# Patient Record
Sex: Male | Born: 1965
Health system: Southern US, Community
[De-identification: ages and names within clinical notes are randomized; demographics above are authoritative.]

## PROBLEM LIST (undated history)

## (undated) ENCOUNTER — Emergency Department (HOSPITAL_COMMUNITY): Admission: EM | Payer: Commercial Managed Care - PPO | Source: Home / Self Care

## (undated) DIAGNOSIS — I10 Essential (primary) hypertension: Secondary | ICD-10-CM

## (undated) DIAGNOSIS — I252 Old myocardial infarction: Secondary | ICD-10-CM

## (undated) DIAGNOSIS — R7303 Prediabetes: Secondary | ICD-10-CM

## (undated) DIAGNOSIS — E785 Hyperlipidemia, unspecified: Secondary | ICD-10-CM

## (undated) DIAGNOSIS — I2510 Atherosclerotic heart disease of native coronary artery without angina pectoris: Secondary | ICD-10-CM

## (undated) HISTORY — DX: Essential (primary) hypertension: I10

## (undated) HISTORY — DX: Hyperlipidemia, unspecified: E78.5

## (undated) HISTORY — DX: Old myocardial infarction: I25.2

## (undated) HISTORY — DX: Prediabetes: R73.03

---

## 2007-02-03 ENCOUNTER — Ambulatory Visit: Payer: Self-pay | Admitting: Family Medicine

## 2007-02-03 DIAGNOSIS — J309 Allergic rhinitis, unspecified: Secondary | ICD-10-CM | POA: Insufficient documentation

## 2007-02-04 ENCOUNTER — Ambulatory Visit: Payer: Self-pay | Admitting: Family Medicine

## 2007-02-08 LAB — CONVERTED CEMR LAB
Albumin: 4 g/dL (ref 3.5–5.2)
Alkaline Phosphatase: 74 units/L (ref 39–117)
BUN: 11 mg/dL (ref 6–23)
Basophils Absolute: 0 10*3/uL (ref 0.0–0.1)
Chloride: 106 meq/L (ref 96–112)
Cholesterol: 215 mg/dL (ref 0–200)
Creatinine, Ser: 1 mg/dL (ref 0.4–1.5)
Direct LDL: 150.9 mg/dL
MCHC: 34.3 g/dL (ref 30.0–36.0)
Monocytes Absolute: 0.6 10*3/uL (ref 0.2–0.7)
Monocytes Relative: 8.8 % (ref 3.0–11.0)
Platelets: 200 10*3/uL (ref 150–400)
Potassium: 4.1 meq/L (ref 3.5–5.1)
RBC: 5.66 M/uL (ref 4.22–5.81)
RDW: 11.7 % (ref 11.5–14.6)
Total Bilirubin: 0.8 mg/dL (ref 0.3–1.2)
Total CHOL/HDL Ratio: 7.1
Triglycerides: 148 mg/dL (ref 0–149)

## 2007-08-01 ENCOUNTER — Ambulatory Visit: Payer: Self-pay | Admitting: Family Medicine

## 2007-08-01 DIAGNOSIS — I10 Essential (primary) hypertension: Secondary | ICD-10-CM | POA: Insufficient documentation

## 2007-08-02 LAB — CONVERTED CEMR LAB
CO2: 31 meq/L (ref 19–32)
Creatinine, Ser: 0.9 mg/dL (ref 0.4–1.5)
Glucose, Bld: 94 mg/dL (ref 70–99)
HDL: 25.1 mg/dL — ABNORMAL LOW (ref >39.0)
Potassium: 4 meq/L (ref 3.5–5.1)
Sodium: 140 meq/L (ref 135–145)
Triglycerides: 138 mg/dL (ref 0–149)
VLDL: 28 mg/dL (ref 0–40)

## 2007-09-08 ENCOUNTER — Ambulatory Visit: Payer: Self-pay | Admitting: Family Medicine

## 2007-09-19 ENCOUNTER — Telehealth (INDEPENDENT_AMBULATORY_CARE_PROVIDER_SITE_OTHER): Payer: Self-pay | Admitting: Internal Medicine

## 2007-09-21 ENCOUNTER — Ambulatory Visit: Payer: Self-pay | Admitting: Family Medicine

## 2007-09-29 ENCOUNTER — Telehealth (INDEPENDENT_AMBULATORY_CARE_PROVIDER_SITE_OTHER): Payer: Self-pay | Admitting: Internal Medicine

## 2007-10-11 ENCOUNTER — Telehealth (INDEPENDENT_AMBULATORY_CARE_PROVIDER_SITE_OTHER): Payer: Self-pay | Admitting: Internal Medicine

## 2007-10-25 ENCOUNTER — Encounter: Payer: Self-pay | Admitting: Family Medicine

## 2007-11-08 ENCOUNTER — Ambulatory Visit: Payer: Self-pay | Admitting: Family Medicine

## 2007-11-10 LAB — CONVERTED CEMR LAB
ALT: 36 units/L (ref 0–53)
AST: 26 units/L (ref 0–37)
Cholesterol: 227 mg/dL (ref 0–200)
Direct LDL: 158.4 mg/dL
Total CHOL/HDL Ratio: 7.8

## 2007-12-08 ENCOUNTER — Ambulatory Visit: Payer: Self-pay | Admitting: Family Medicine

## 2008-01-18 ENCOUNTER — Ambulatory Visit: Payer: Self-pay | Admitting: Family Medicine

## 2008-01-20 LAB — CONVERTED CEMR LAB
ALT: 31 units/L (ref 0–53)
AST: 23 units/L (ref 0–37)
Alkaline Phosphatase: 60 units/L (ref 39–117)
Bilirubin, Direct: 0.1 mg/dL (ref 0.0–0.3)
Cholesterol: 149 mg/dL (ref 0–200)
Total Protein: 7.3 g/dL (ref 6.0–8.3)

## 2008-03-13 ENCOUNTER — Ambulatory Visit: Payer: Self-pay | Admitting: Family Medicine

## 2008-07-03 ENCOUNTER — Encounter (INDEPENDENT_AMBULATORY_CARE_PROVIDER_SITE_OTHER): Payer: Self-pay | Admitting: *Deleted

## 2008-07-06 ENCOUNTER — Ambulatory Visit: Payer: Self-pay | Admitting: Family Medicine

## 2008-07-12 LAB — CONVERTED CEMR LAB
AST: 27 units/L (ref 0–37)
Alkaline Phosphatase: 69 units/L (ref 39–117)
Total Bilirubin: 1 mg/dL (ref 0.3–1.2)
Total CHOL/HDL Ratio: 4.4

## 2009-04-18 ENCOUNTER — Ambulatory Visit: Payer: Self-pay | Admitting: Family Medicine

## 2009-04-19 ENCOUNTER — Telehealth (INDEPENDENT_AMBULATORY_CARE_PROVIDER_SITE_OTHER): Payer: Self-pay | Admitting: Internal Medicine

## 2009-04-19 LAB — CONVERTED CEMR LAB
ALT: 32 units/L (ref 0–53)
AST: 26 units/L (ref 0–37)
BUN: 13 mg/dL (ref 6–23)
CO2: 27 meq/L (ref 19–32)
Calcium: 8.7 mg/dL (ref 8.4–10.5)
Creatinine, Ser: 0.9 mg/dL (ref 0.4–1.5)
HDL: 36.4 mg/dL — ABNORMAL LOW (ref >39.00)
LDL Cholesterol: 94 mg/dL (ref 0–99)
VLDL: 19.4 mg/dL (ref 0.0–40.0)

## 2009-07-24 ENCOUNTER — Ambulatory Visit: Payer: Self-pay | Admitting: Family Medicine

## 2009-07-24 DIAGNOSIS — K644 Residual hemorrhoidal skin tags: Secondary | ICD-10-CM | POA: Insufficient documentation

## 2009-08-15 ENCOUNTER — Ambulatory Visit: Payer: Self-pay | Admitting: Family Medicine

## 2009-08-15 DIAGNOSIS — S838X9A Sprain of other specified parts of unspecified knee, initial encounter: Secondary | ICD-10-CM | POA: Insufficient documentation

## 2009-08-15 DIAGNOSIS — S86819A Strain of other muscle(s) and tendon(s) at lower leg level, unspecified leg, initial encounter: Secondary | ICD-10-CM

## 2009-10-08 ENCOUNTER — Ambulatory Visit: Payer: Self-pay | Admitting: Family Medicine

## 2009-10-09 ENCOUNTER — Ambulatory Visit: Payer: Self-pay | Admitting: Family Medicine

## 2009-10-09 DIAGNOSIS — I10 Essential (primary) hypertension: Secondary | ICD-10-CM | POA: Insufficient documentation

## 2009-10-09 DIAGNOSIS — E785 Hyperlipidemia, unspecified: Secondary | ICD-10-CM | POA: Insufficient documentation

## 2009-10-09 LAB — CONVERTED CEMR LAB
ALT: 29 units/L (ref 0–53)
AST: 24 units/L (ref 0–37)
CO2: 30 meq/L (ref 19–32)
Calcium: 9 mg/dL (ref 8.4–10.5)
Cholesterol: 131 mg/dL (ref 0–200)
Creatinine, Ser: 0.9 mg/dL (ref 0.4–1.5)
GFR calc non Af Amer: 118.16 mL/min (ref >60)
LDL Cholesterol: 75 mg/dL (ref 0–99)
Sodium: 139 meq/L (ref 135–145)

## 2009-12-02 ENCOUNTER — Telehealth: Payer: Self-pay | Admitting: Family Medicine

## 2010-04-09 ENCOUNTER — Ambulatory Visit: Payer: Self-pay | Admitting: Family Medicine

## 2010-04-11 LAB — CONVERTED CEMR LAB
Basophils Relative: 0.3 % (ref 0.0–3.0)
Eosinophils Relative: 4.6 % (ref 0.0–5.0)
Hemoglobin: 16.6 g/dL (ref 13.0–17.0)
Lymphocytes Relative: 29.5 % (ref 12.0–46.0)
MCHC: 34.5 g/dL (ref 30.0–36.0)
Monocytes Relative: 8.3 % (ref 3.0–12.0)
Neutro Abs: 3.2 10*3/uL (ref 1.4–7.7)
Neutrophils Relative %: 57.3 % (ref 43.0–77.0)
RBC: 5.4 M/uL (ref 4.22–5.81)
WBC: 5.6 10*3/uL (ref 4.5–10.5)

## 2010-04-14 ENCOUNTER — Ambulatory Visit: Payer: Self-pay | Admitting: Family Medicine

## 2010-07-22 NOTE — Progress Notes (Signed)
Summary: ? food poisoning  Phone Note Call from Patient Call back at Home Phone [phone]   Caller: Patient Call For: Dr. Ermalene Searing Summary of Call: The patient says he is convinced he has food poisoning. He has had diarrhea since Friday.  This morning he tried to eat a banana and vomited immediately.  He says he doesn't even think he can come in to the office because of the diarrhea.  Can you phone something in for that and then if he needs to make an appointment, he will?  [facility] Initial call taken by: Delilah Shan CMA Duncan Dull),  December 02, 2009 9:11 AM  Follow-up for Phone Call         Most likely has viral gastroenteritis...most common cause of vomiting and diarrhea. If symptoms started 2-3 hours after a meal of concern...could be "food poisoning" from bacterial toxin.  In both causes... hydration is most important. Food less important than fluids. Push small amounts of water/gatorade constantly to stay hydrated. Verify he is keeping fluids down and peeing normally...if not needs to be seen. No great meds for diarrhea..just keep up with fluid loss,return to nml diet as soon as vomiting diminishes. Can try OTC immodium, but minimmally effective. Time for virus to resolve is best treatment! If not improving in next 3-4 days, severe abdominal pain or not keeping down fluids... needs to be seen.   Follow-up by: Kerby Nora MD,  December 02, 2009 9:16 AM  Additional Follow-up for Phone Call Additional follow up Details #1::        patient advised.Consuello Masse CMA   Additional Follow-up by: Benny Lennert CMA Duncan Dull),  December 02, 2009 9:23 AM

## 2010-07-22 NOTE — Assessment & Plan Note (Signed)
Summary: F/U AFTER LABS / LFW   Vital Signs:  Patient profile:   46 year old male Height:      67 inches Weight:      162.0 pounds BMI:     25.46 Temp:     98.0 degrees F oral Pulse rate:   80 / minute Pulse rhythm:   regular BP sitting:   120 / 70  (left arm) Cuff size:   regular  Vitals Entered By: Benny Lennert CMA Duncan Dull) (April 14, 2010 12:38 PM)  History of Present Illness: Chief complaint 6 month follow up   45 year old male:  HTN: stable and at goal  Lipds, tol all meds fine   flying to [place] upcoming, ? if can get some meds to help with flying anxiety  ROS: no fever, chills, sweats, n/v/d  GEN: WDWN, NAD, Non-toxic, A & O x 3 HEENT: Atraumatic, Normocephalic. Neck supple. No masses, No LAD. Ears and Nose: No external deformity. CV: RRR, No M/G/R. No JVD. No thrill. No extra heart sounds. PULM: CTA B, no wheezes, crackles, rhonchi. No retractions. No resp. distress. No accessory muscle use. EXTR: No c/c/e NEURO: Normal gait.  PSYCH: Normally interactive. Conversant. Not depressed or anxious appearing.  Calm demeanor.    Allergies (verified): No Known Drug Allergies  Past History:  Past medical, surgical, family and social histories (including risk factors) reviewed, and no changes noted (except as noted below).  Past Medical History: Reviewed history from 10/09/2009 and no changes required. Hyperlipidemia Hypertension  Family History: Reviewed history from 02/03/2007 and no changes required. Father: 69--unknown Mother: 67--HBP Siblings: 1 br--HBP               2 sis--L&W  DM--no MI/CVA--no No cancer in family  Social History: Reviewed history from 04/18/2009 and no changes required. 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 1 once daily for  bp  Problem # 2:  HYPERLIPIDEMIA (ICD-272.4) Assessment: Unchanged  His updated medication list for this problem includes:    Zocor 20 Mg Tabs (Simvastatin) .Marland Kitchen... 1 once daily for cholesterol  Complete Medication List: 1)  Zocor 20 Mg Tabs (Simvastatin) .Marland Kitchen.. 1 once daily for cholesterol 2)  Zestril 10 Mg Tabs (Lisinopril) .Marland Kitchen.. 1 once daily for  bp 3)  Valium 2 Mg Tabs (Diazepam) .Marland Kitchen.. 1 as needed for flying at start of flight Prescriptions: VALIUM 2 MG TABS (DIAZEPAM) 1 as needed for flying at start of flight  #8 x 0   Entered and Authorized by:   [name] MD   Signed by:   [name] MD on 04/14/2010   Method used:   Print then Give to Patient   RxID:   [id number]    Orders Added: 1)  Est. Patient Level III [14782]    Current Allergies (reviewed today): No known allergies

## 2010-07-22 NOTE — Assessment & Plan Note (Signed)
Summary: hemoriods/Steven Kemp billies patient/Steven Kemp   Vital Signs:  Patient profile:   45 year old male Weight:      170.25 pounds BMI:     26.76 Temp:     98.9 degrees F oral Pulse rate:   76 / minute Pulse rhythm:   regular BP sitting:   132 / 98  (right arm) Cuff size:   regular  Vitals Entered By: Linde Gillis CMA Duncan Dull) (July 24, 2009 11:53 AM) CC: hemmoroids   History of Present Illness: 46 year old male:  Has some hemorrhoids - tried some suppositories - did do some sitz baths and some witch hazel. Did not have a bm for a few days. Normally has some normal bms.  A little uncomfortable with some having a bm and with turning around and twisting. Had a little blood in his stoo. No bleeding since.   REVIEW OF SYSTEMS GEN: Acute illness details above. CV: No chest pain or SOB GI: No noted N or V Otherwise, pertinent positives and negatives are noted in the HPI.   GEN: Well-developed,well-nourished,in no acute distress; alert,appropriate and cooperative throughout examination HEENT: Normocephalic and atraumatic without obvious abnormalities. No apparent alopecia or balding. Ears, externally no deformities PULM: Breathing comfortably in no respiratory distress EXT: No clubbing, cyanosis, or edema PSYCH: Normally interactive. Cooperative during the interview. Pleasant. Friendly and conversant. Not anxious or depressed appearing. Normal, full affect.   Rectal: external and internal hemorrhoids, mildly tender, no thrombosis, no fistula  Current Problems (verified): 1)  Hypertension, Benign Essential  (ICD-401.1) 2)  Screening For Malignant Neoplasm, Prostate  (ICD-V76.44) 3)  Examination, Routine Medical  (ICD-V70.0) 4)  Allergic Rhinitis  (ICD-477.9) 5)  Hypercholesterolemia, Pure  (ICD-272.0)  Allergies (verified): No Known Drug Allergies  Past History:  Past medical, surgical, family and social histories (including risk factors) reviewed, and no changes noted (except as  noted below).  Past Medical History: Reviewed history from 02/03/2007 and no changes required. boarderline hypertension  Family History: Reviewed history from 02/03/2007 and no changes required. Father: 69--unknown Mother: 67--HBP Siblings: 1 br--HBP               2 sis--L&W  DM--no MI/CVA--no No cancer in family  Social History: Reviewed history from 04/18/2009 and no changes required. 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 1 once daily for cholesterol 2)  Zestril 10 Mg Tabs (Lisinopril) .Marland Kitchen.. 1 once daily for  bp 3)  Hemorrhoidal-hc 25 Mg Supp (Hydrocortisone acetate) .Marland Kitchen.. 1 by rectum as directed 4)  Proctofoam Hc 1-1 % Foam (Hydrocortisone ace-pramoxine) .... Apply up to 5 times a day as needed for hemorrhoids Prescriptions: PROCTOFOAM HC 1-1 % FOAM (HYDROCORTISONE ACE-PRAMOXINE) Apply up to 5 times a day as needed for hemorrhoids  #1 x 2   Entered and Authorized by:   [name] MD   Signed by:   [name] MD on 07/24/2009   Method used:   [facility]        [facility] [id number]       [address]       [place], [place]  [address]  Ph: [phone] or [phone]       Fax: [phone]   RxID:   [id number] HEMORRHOIDAL-HC 25 MG SUPP (HYDROCORTISONE ACETATE) 1 by rectum as directed  #30 x 2   Entered and Authorized by:   [name] MD   Signed by:   [name] MD on 07/24/2009   Method used:   Elect[facility] to        [facility] (retail)       [address]        [place], [place]  [address]       Ph: [phone] or [phone]       Fax: [phone]   RxID:   [id number]   Current Allergies (reviewed today): No known allergies

## 2010-07-22 NOTE — Assessment & Plan Note (Signed)
Summary: R CALF PAIN/CLE   Vital Signs:  Patient profile:   45 year old male Height:      67 inches Weight:      166.38 pounds BMI:     26.15 Temp:     97.8 degrees F oral Pulse rate:   76 / minute Pulse rhythm:   regular BP sitting:   124 / 82  (left arm) Cuff size:   regular  Vitals Entered By: Benny Lennert CMA Duncan Dull) (August 15, 2009 11:53 AM) CC: right calf pain   History of Present Illness: This week, patient with acute R calf pain when slipping and abruptly stretching out leg to the side. Medial calf pain, minimal swelling, no bruising. Pain with walking.  REVIEW OF SYSTEMS  GEN: No systemic complaints, no fevers, chills, sweats, or other acute illnesses MSK: Detailed in the HPI GI: tolerating PO intake without difficulty Neuro: No numbness, parasthesias, or tingling associated. Otherwise the pertinent positives of the ROS are noted above.    GEN: Well-developed,well-nourished,in no acute distress; alert,appropriate and cooperative throughout examination HEENT: Normocephalic and atraumatic without obvious abnormalities. No apparent alopecia or balding. Ears, externally no deformities PULM: Breathing comfortably in no respiratory distress EXT: No clubbing, cyanosis, or edema PSYCH: Normally interactive. Cooperative during the interview. Pleasant. Friendly and conversant. Not anxious or depressed appearing. Normal, full affect.   R calf, ttp medially. Mild swelling. No defect. Full rom at ankle.  Allergies (verified): No Known Drug Allergies   Impression & Recommendations:  Problem # 1:  MUSCLE STRAIN, RIGHT CALF (ICD-844.8) Assessment New grade 1 medial calf tear, likely at musculotendinous junction  reviewed rehab and care.  Complete Medication List: 1)  Zocor 20 Mg Tabs (Simvastatin) .Marland Kitchen.. 1 once daily for cholesterol 2)  Zestril 10 Mg Tabs (Lisinopril) .Marland Kitchen.. 1 once daily for  bp 3)  Hemorrhoidal-hc 25 Mg Supp (Hydrocortisone acetate) .Marland Kitchen.. 1 by rectum as  directed 4)  Proctofoam Hc 1-1 % Foam (Hydrocortisone ace-pramoxine) .... Apply up to 5 times a day as needed for hemorrhoids  Current Allergies (reviewed today): No known allergies

## 2010-07-22 NOTE — Assessment & Plan Note (Signed)
Summary: ROA 6 MTHS CYD   Vital Signs:  Patient profile:   45 year old male Height:      67 inches Weight:      168.50 pounds BMI:     26.49 Temp:     98.4 degrees F oral Pulse rate:   80 / minute Pulse rhythm:   regular BP sitting:   128 / 82  (left arm) Cuff size:   regular  Vitals Entered By: Lewanda Rife LPN (October 09, 2009 11:28 AM) CC: six month follow up   History of Present Illness: 45 year old male:  HTN: stable, cont lisinopril, no probs  Lipids, stable, on Zocor  Allergies (verified): No Known Drug Allergies  Past History:  Past medical, surgical, family and social histories (including risk factors) reviewed, and no changes noted (except as noted below).  Past Medical History: Hyperlipidemia Hypertension  Family History: Reviewed history from 02/03/2007 and no changes required. Father: 69--unknown Mother: 67--HBP Siblings: 1 br--HBP               2 sis--L&W  DM--no MI/CVA--no No cancer in family  Social History: Reviewed history from 04/18/2009 and no changes required. 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 GI: No n/v/d Pulm: No SOB, cough, wheezing Interactive and getting along well at home.  Otherwise, ROS is as per the HPI.   Physical Exam  Additional Exam:  GEN: WDWN, NAD, Non-toxic, A & O x 3 HEENT: Atraumatic, Normocephalic. Neck supple. No masses, No LAD. Ears and Nose: No external deformity. CV: RRR, No M/G/R. No JVD. No thrill. No extra heart sounds. PULM: CTA B, no wheezes, crackles, rhonchi. No retractions. No resp. distress. No accessory muscle use. EXTR: No c/c/e NEURO: Normal gait.  PSYCH: Normally interactive. Conversant. Not  depressed or anxious appearing.  Calm demeanor.     Impression & Recommendations:  Problem # 1:  HYPERTENSION (ICD-401.9) Assessment Unchanged  His updated medication list for this problem includes:    Zestril 10 Mg Tabs (Lisinopril) .Marland Kitchen... 1 once daily for  bp  Problem # 2:  HYPERLIPIDEMIA (ICD-272.4) Assessment: Unchanged  His updated medication list for this problem includes:    Zocor 20 Mg Tabs (Simvastatin) .Marland Kitchen... 1 once daily for cholesterol  Complete Medication List: 1)  Zocor 20 Mg Tabs (Simvastatin) .Marland Kitchen.. 1 once daily for cholesterol 2)  Zestril 10 Mg Tabs (Lisinopril) .Marland Kitchen.. 1 once daily for  bp 3)  Hemorrhoidal-hc 25 Mg Supp (Hydrocortisone acetate) .Marland Kitchen.. 1 by rectum as directed 4)  Proctofoam Hc 1-1 % Foam (Hydrocortisone ace-pramoxine) .... Apply up to 5 times a day as needed for hemorrhoids  Patient Instructions: 1)  f/u physical (30 min, 6 months) 2)  PSA, CBC with diff: prior to appt Prescriptions: ZESTRIL 10 MG  TABS (LISINOPRIL) 1 once daily for  BP  #90 x 3   Entered and Authorized by:   [name] MD   Signed by:   [name] MD on 10/09/2009   Method used:   Print then Give to Patient   RxID:   [id number] ZOCOR 20 MG  TABS (SIMVASTATIN) 1 once daily for cholesterol  #90 x 3   Entered and Authorized by:   Karleen Hampshire Tinslee Klare  MD   Signed by:   [name] MD on 10/09/2009   Method used:   Print then Give to Patient   RxID:   [id number]   Current Allergies (reviewed today): No known allergies

## 2010-12-05 ENCOUNTER — Other Ambulatory Visit: Payer: Self-pay | Admitting: *Deleted

## 2010-12-08 MED ORDER — DIAZEPAM 2 MG PO TABS: ORAL_TABLET | ORAL | Status: DC

## 2010-12-08 NOTE — Telephone Encounter (Signed)
Rx called to pharmacy

## 2010-12-08 NOTE — Telephone Encounter (Signed)
Please call in #8, 0 refills

## 2011-08-14 ENCOUNTER — Telehealth: Payer: Self-pay | Admitting: Family Medicine

## 2011-08-14 NOTE — Telephone Encounter (Signed)
Triage Record Num: [id number] Operator: Chevis Pretty Patient Name: Steven Kemp Call Date & Time: 08/14/2011 1:00:21PM Patient Phone: [phone] PCP: [name] Patient Gender: Male PCP Fax : [phone] Patient DOB: [birth year] Practice Name: [facility] Reason for Call: Caller: Chrisopher/Patient; PCP: [name] T.; CB#: [phone]; ; ; Call regarding Cough/Congestion; onset 3 weeks ago, but cough persistent. Afebrile. Denies wheezing, but does have loose congestion. c/o frontal headache/cheekbone pain. Per protocol, emergent symptoms denied; advised appt within 24 hours. No appts available in system; per staff request, patient scheduled at [facility] office 08/15/11 0915. MAY REACH PATIENT AT [phone]. Protocol(s) Used: Cough - Adult Recommended Outcome per Protocol: See Provider within 24 hours Reason for Outcome: Productive cough with colored sputum (other than clear or white sputum) Care Advice: ~ Use a cool mist humidifier to moisten air. Be sure to clean according to manufacturer's instructions. Limit or avoid exposure to irritants and allergens (e.g. air pollution, smoke/smoking, chemicals, dust, pollen, pet dander, etc.) ~ Call provider if fever greater than 101.5 F (38.6 C) or 100.5 F (38.1C) in an immunocompromised patient (such as diabetes, HIV/AIDS, renal disease, chemotherapy, organ transplant, or chronic steroid use) has not improved in 24 hours. ~ Increase fluids to 8-12 eight oz (1.6 to 2.4 liters) glasses per day, half of them to be water. Soups, popsicles, fruit juices, non-caffeinated sodas (unless restricting sodium intake), jello, broths, decaf teas, etc. are all okay. Warm fluids can be soothing. ~ ~ If you can, stop smoking now and avoid all secondhand smoke. ~ HEALTH PROMOTION / MAINTENANCE ~ SYMPTOM / CONDITION MANAGEMENT ~ CAUTIONS Coughing up mucus or phlegm helps to get rid of an infection. A productive cough should  not be stopped. A cough medicine with guaifenesin (Robitussin, Mucinex) can help loosen the mucus. Cough medicine with dextromethorphan (DM) should be avoided. Drinking lots of fluids can help loosen the mucus too, especially warm fluids. ~ 02/

## 2011-08-15 ENCOUNTER — Ambulatory Visit (INDEPENDENT_AMBULATORY_CARE_PROVIDER_SITE_OTHER): Payer: No Typology Code available for payment source | Admitting: Internal Medicine

## 2011-08-15 ENCOUNTER — Encounter: Payer: Self-pay | Admitting: Internal Medicine

## 2011-08-15 VITALS — BP 110/72 | HR 85 | Temp 98.0°F | Ht 68.0 in | Wt 171.0 lb

## 2011-08-15 DIAGNOSIS — J45909 Unspecified asthma, uncomplicated: Secondary | ICD-10-CM

## 2011-08-15 MED ORDER — PREDNISONE 20 MG PO TABS: 40.0000 mg | ORAL_TABLET | Freq: Every day | ORAL | Status: AC

## 2011-08-15 NOTE — Progress Notes (Signed)
  Subjective:    Patient ID: Steven Kemp, male    DOB: [birth year], 46 y.o.   MRN: [id number]  HPI Here with daughter who is also sick  Started with cough Mucus which was dark but now lighter Has cough worse at night with some wheezing  Chronic rhinorrhea which was not worse at first---relates to allergies Started with cough 2.5 weeks ago No fever No SOB No sore throat---?slight irritation from cough  Not much nasal congestion or PND No ear pain  Has tried coricidin--?may have helped some  Current Outpatient Prescriptions on File Prior to Visit  Medication Sig Dispense Refill  . diazepam (VALIUM) 2 MG tablet Take one as needed for flying at start of flight.  8 tablet  0    No Known Allergies  Past Medical History  Diagnosis Date  . Hyperlipidemia   . Hypertension     No past surgical history on file.  No family history on file.  History   Social History  . Marital Status: Married    Spouse Name: N/A    Number of Children: 2  . Years of Education: N/A   Occupational History  . [employer]     Data base management   Social History Main Topics  . Smoking status: Never Smoker   . Smokeless tobacco: Never Used  . Alcohol Use: Yes     very rare  . Drug Use: Not on file  . Sexually Active: Not on file   Other Topics Concern  . Not on file   Social History Narrative  . No narrative on file   Review of Systems No vomiting or diarrhea No rash--except sensitivity on left thumb Appetite is fine     Objective:   Physical Exam  Constitutional: He appears well-developed and well-nourished. No distress.  HENT:  Right Ear: External ear normal.  Left Ear: External ear normal.       Very slight pharyngeal and uvula injection. No exudate Moderate swelling in right nare  Neck: Normal range of motion. Neck supple.  Pulmonary/Chest: Effort normal. No respiratory distress. He has wheezes. He has no rales.       Mild exp wheezes but not really  tight Good breath sounds  Lymphadenopathy:    He has no cervical adenopathy.          Assessment & Plan:

## 2011-08-15 NOTE — Assessment & Plan Note (Signed)
Sounds like possible RSV with residual cough No evidence of bacterial infection Will try prednisone course Hold off on antibiotics Supportive care discussed

## 2011-09-28 ENCOUNTER — Other Ambulatory Visit: Payer: Self-pay | Admitting: Family Medicine

## 2011-12-04 ENCOUNTER — Other Ambulatory Visit: Payer: Self-pay | Admitting: Family Medicine

## 2012-06-13 ENCOUNTER — Other Ambulatory Visit: Payer: Self-pay | Admitting: Family Medicine

## 2012-06-30 ENCOUNTER — Encounter: Payer: Self-pay | Admitting: Family Medicine

## 2012-06-30 ENCOUNTER — Ambulatory Visit (INDEPENDENT_AMBULATORY_CARE_PROVIDER_SITE_OTHER): Payer: BC Managed Care – PPO | Admitting: Family Medicine

## 2012-06-30 VITALS — BP 130/72 | HR 82 | Temp 97.9°F | Ht 68.0 in | Wt 180.2 lb

## 2012-06-30 DIAGNOSIS — H6692 Otitis media, unspecified, left ear: Secondary | ICD-10-CM

## 2012-06-30 DIAGNOSIS — H6690 Otitis media, unspecified, unspecified ear: Secondary | ICD-10-CM

## 2012-06-30 MED ORDER — SIMVASTATIN 20 MG PO TABS: 20.0000 mg | ORAL_TABLET | Freq: Every day | ORAL | Status: DC

## 2012-06-30 MED ORDER — LISINOPRIL 10 MG PO TABS: 10.0000 mg | ORAL_TABLET | Freq: Every day | ORAL | Status: DC

## 2012-06-30 MED ORDER — AMOXICILLIN 500 MG PO CAPS: 1000.0000 mg | ORAL_CAPSULE | Freq: Two times a day (BID) | ORAL | Status: DC

## 2012-06-30 NOTE — Progress Notes (Signed)
[facility] [address] [place] [address] Phone: [phone] Fax: [phone]  Date:  06/30/2012   Name:  Steven Kemp   DOB:  07-Apr-1966   MRN:  [id number] Gender: male Age: 47 y.o.  PCP:  [name], MD  Evaluating MD: [name], MD   Chief Complaint: Sore Throat, Cough and Nasal Congestion   History of Present Illness:  Steven Kemp is a 47 y.o. pleasant patient who presents with the following:  Girls were sick, and one had an ear infection, and now is coughing a lot. Took some amoxicillin. No fever, last week had some flu like symptoms, body was coughing up a lot of phlegm. Has had some congestion, cough up of phlegm.  Patient Active Problem List  Diagnosis  . HYPERLIPIDEMIA  . HYPERTENSION  . EXTERNAL HEMORRHOIDS WITHOUT MENTION COMP  . ALLERGIC  RHINITIS    Past Medical History  Diagnosis Date  . Hyperlipidemia   . Hypertension     No past surgical history on file.  History  Substance Use Topics  . Smoking status: Never Smoker   . Smokeless tobacco: Never Used  . Alcohol Use: Yes     Comment: very rare    No family history on file.  No Known Allergies  Medication list has been reviewed and updated.  Outpatient Prescriptions Prior to Visit  Medication Sig Dispense Refill  . lisinopril (PRINIVIL,ZESTRIL) 10 MG tablet TAKE 1 TABLET ONCE DAILY FOR BLOOD PRESSURE  30 tablet  0  . simvastatin (ZOCOR) 20 MG tablet TAKE 1 TABLET BY MOUTH ONCE A DAY FOR CHOLESTEROL  30 tablet  0  . diazepam (VALIUM) 2 MG tablet Take one as needed for flying at start of flight.  8 tablet  0   Last reviewed on 06/30/2012  8:10 AM by [name], MD  Review of Systems:  ROS: GEN: Acute illness details above GI: Tolerating PO intake GU: maintaining adequate hydration and urination Pulm: No SOB Interactive and getting along well at home.  Otherwise, ROS is as per the HPI.   Physical Examination: BP 130/72   Pulse 82  Temp 97.9 F (36.6 C) (Oral)  Ht 5\' 8"  (1.727 m)  Wt 180 lb 4 oz (81.761 kg)  BMI 27.41 kg/m2  Ideal Body Weight: Weight in (lb) to have BMI = 25: 164.1    Gen: WDWN, NAD; A & O x3, cooperative. Pleasant.Globally Non-toxic HEENT: Normocephalic and atraumatic. Throat clear, w/o exudate, R TM clear, L TM - bulging reddish TM. rhinnorhea.  MMM Frontal sinuses: NT Max sinuses: NT NECK: Anterior cervical  LAD is absent CV: RRR, No M/G/R, cap refill <2 sec PULM: Breathing comfortably in no respiratory distress. no wheezing, crackles, rhonchi EXT: No c/c/e PSYCH: Friendly, good eye contact MSK: Nml gait    Assessment and Plan:  1. Otitis media, left    Amox, supportive care  Orders Today:  No orders of the defined types were placed in this encounter.    Updated Medication List: (Includes new medications, updates to list, dose adjustments) Meds ordered this encounter  Medications  . lisinopril (PRINIVIL,ZESTRIL) 10 MG tablet    Sig: Take 1 tablet (10 mg total) by mouth daily.    Dispense:  90 tablet    Refill:  1  . simvastatin (ZOCOR) 20 MG tablet    Sig: Take 1 tablet (20 mg total) by mouth at bedtime.    Dispense:  90 tablet    Refill:  1  . amoxicillin (AMOXIL) 500 MG capsule    Sig: Take 2 capsules (1,000 mg total) by mouth 2 (two) times daily.    Dispense:  40 capsule    Refill:  0    Medications Discontinued: Medications Discontinued During This Encounter  Medication Reason  . lisinopril (PRINIVIL,ZESTRIL) 10 MG tablet Reorder  . simvastatin (ZOCOR) 20 MG tablet Reorder     [name], MD

## 2012-08-09 ENCOUNTER — Other Ambulatory Visit: Payer: Self-pay | Admitting: Family Medicine

## 2013-02-10 ENCOUNTER — Ambulatory Visit (INDEPENDENT_AMBULATORY_CARE_PROVIDER_SITE_OTHER): Payer: BC Managed Care – PPO | Admitting: Internal Medicine

## 2013-02-10 ENCOUNTER — Encounter: Payer: Self-pay | Admitting: Internal Medicine

## 2013-02-10 VITALS — BP 140/80 | HR 77 | Temp 98.1°F | Wt 180.0 lb

## 2013-02-10 DIAGNOSIS — H8110 Benign paroxysmal vertigo, unspecified ear: Secondary | ICD-10-CM | POA: Insufficient documentation

## 2013-02-10 MED ORDER — MECLIZINE HCL 25 MG PO TABS: 25.0000 mg | ORAL_TABLET | Freq: Three times a day (TID) | ORAL | Status: DC | PRN

## 2013-02-10 NOTE — Progress Notes (Signed)
  Subjective:    Patient ID: Steven Kemp, male    DOB: 1965-12-26, 47 y.o.   MRN: [id number]  HPI Notices spinning when he bends down or gets out of bed Also if he turns over to his left Started about 3 days ago Settles down within 10-30 seconds Uncomfortable feeling  No tinnitus or hearing loss No fever Doesn't feel sick  Hasn't tried any meds  Current Outpatient Prescriptions on File Prior to Visit  Medication Sig Dispense Refill  . lisinopril (PRINIVIL,ZESTRIL) 10 MG tablet Take 1 tablet (10 mg total) by mouth daily.  90 tablet  1  . simvastatin (ZOCOR) 20 MG tablet Take 1 tablet (20 mg total) by mouth at bedtime.  90 tablet  1   No current facility-administered medications on file prior to visit.    No Known Allergies  Past Medical History  Diagnosis Date  . Hyperlipidemia   . Hypertension     No past surgical history on file.  No family history on file.  History   Social History  . Marital Status: Married    Spouse Name: N/A    Number of Children: 2  . Years of Education: N/A   Occupational History  . [employer]     Data base management   Social History Main Topics  . Smoking status: Never Smoker   . Smokeless tobacco: Never Used  . Alcohol Use: Yes     Comment: very rare  . Drug Use: Not on file  . Sexual Activity: Not on file   Other Topics Concern  . Not on file   Social History Narrative  . No narrative on file   Review of Systems Eating okay No weakness, speech or swallowing problems, etc     Objective:   Physical Exam  Constitutional: He is oriented to person, place, and time. He appears well-developed and well-nourished. No distress.  HENT:  Right Ear: External ear normal.  Left Ear: External ear normal.  Mouth/Throat: Oropharynx is clear and moist. No oropharyngeal exudate.  TMs normal  Neck: Normal range of motion. Neck supple. No thyromegaly present.  Lymphadenopathy:    He has no cervical adenopathy.   Neurological: He is alert and oriented to person, place, and time. He has normal strength. He displays no atrophy and no tremor. No cranial nerve deficit or sensory deficit. He exhibits normal muscle tone. He displays a negative Romberg sign. Coordination and gait normal.          Assessment & Plan:

## 2013-02-10 NOTE — Patient Instructions (Signed)
Please take the meclizine three times a day. Once your symptoms have resolved, you can wean down off the medication over a few days

## 2013-02-10 NOTE — Assessment & Plan Note (Signed)
Fairly classic history No tinnitus or hearing change Neuro exam is normal  Reassured Will try meclizine and wean when symptoms have abated

## 2013-05-01 ENCOUNTER — Other Ambulatory Visit: Payer: Self-pay | Admitting: Family Medicine

## 2013-05-01 NOTE — Telephone Encounter (Signed)
Last office visit 02/10/2013 with Dr. Alphonsus Sias.  Ok to refill?

## 2013-08-26 ENCOUNTER — Other Ambulatory Visit: Payer: Self-pay | Admitting: Family Medicine

## 2013-08-27 NOTE — Telephone Encounter (Signed)
Last office visit 02/10/13 with Dr. Silvio Pate.  Last Lipid 09/2009.  Ok to refill?

## 2013-08-28 NOTE — Telephone Encounter (Signed)
Ok to refill 30, 1 ref both  F/u cpx

## 2013-09-01 ENCOUNTER — Other Ambulatory Visit: Payer: Self-pay | Admitting: Family Medicine

## 2013-09-01 DIAGNOSIS — Z79899 Other long term (current) drug therapy: Secondary | ICD-10-CM

## 2013-09-01 DIAGNOSIS — E785 Hyperlipidemia, unspecified: Secondary | ICD-10-CM

## 2013-09-01 DIAGNOSIS — Z125 Encounter for screening for malignant neoplasm of prostate: Secondary | ICD-10-CM

## 2013-09-04 ENCOUNTER — Other Ambulatory Visit (INDEPENDENT_AMBULATORY_CARE_PROVIDER_SITE_OTHER): Payer: BC Managed Care – PPO

## 2013-09-04 DIAGNOSIS — Z125 Encounter for screening for malignant neoplasm of prostate: Secondary | ICD-10-CM

## 2013-09-04 DIAGNOSIS — I10 Essential (primary) hypertension: Secondary | ICD-10-CM

## 2013-09-04 DIAGNOSIS — Z79899 Other long term (current) drug therapy: Secondary | ICD-10-CM

## 2013-09-04 DIAGNOSIS — E785 Hyperlipidemia, unspecified: Secondary | ICD-10-CM

## 2013-09-04 LAB — BASIC METABOLIC PANEL
BUN: 12 mg/dL (ref 6–23)
CO2: 30 meq/L (ref 19–32)
Calcium: 9.3 mg/dL (ref 8.4–10.5)
Chloride: 104 mEq/L (ref 96–112)
Creatinine, Ser: 1 mg/dL (ref 0.4–1.5)
GFR: 107.78 mL/min (ref >60.00)
GLUCOSE: 153 mg/dL — AB (ref 70–99)
POTASSIUM: 4 meq/L (ref 3.5–5.1)
Sodium: 140 mEq/L (ref 135–145)

## 2013-09-04 LAB — CBC WITH DIFFERENTIAL/PLATELET
Basophils Absolute: 0 10*3/uL (ref 0.0–0.1)
Basophils Relative: 0.3 % (ref 0.0–3.0)
EOS PCT: 6 % — AB (ref 0.0–5.0)
Eosinophils Absolute: 0.4 10*3/uL (ref 0.0–0.7)
HCT: 45.9 % (ref 39.0–52.0)
Hemoglobin: 15.5 g/dL (ref 13.0–17.0)
Lymphocytes Relative: 29.2 % (ref 12.0–46.0)
Lymphs Abs: 1.7 10*3/uL (ref 0.7–4.0)
MCHC: 33.8 g/dL (ref 30.0–36.0)
MCV: 86.2 fl (ref 78.0–100.0)
MONOS PCT: 7.6 % (ref 3.0–12.0)
Monocytes Absolute: 0.5 10*3/uL (ref 0.1–1.0)
Neutro Abs: 3.4 10*3/uL (ref 1.4–7.7)
Neutrophils Relative %: 56.9 % (ref 43.0–77.0)
Platelets: 164 10*3/uL (ref 150.0–400.0)
RBC: 5.32 Mil/uL (ref 4.22–5.81)
RDW: 12.9 % (ref 11.5–14.6)
WBC: 6 10*3/uL (ref 4.5–10.5)

## 2013-09-04 LAB — HEPATIC FUNCTION PANEL
ALT: 19 U/L (ref 0–53)
AST: 19 U/L (ref 0–37)
Albumin: 4.1 g/dL (ref 3.5–5.2)
Alkaline Phosphatase: 73 U/L (ref 39–117)
BILIRUBIN DIRECT: 0.1 mg/dL (ref 0.0–0.3)
Total Bilirubin: 0.6 mg/dL (ref 0.3–1.2)
Total Protein: 7.1 g/dL (ref 6.0–8.3)

## 2013-09-04 LAB — LIPID PANEL
CHOL/HDL RATIO: 4
Cholesterol: 154 mg/dL (ref 0–200)
HDL: 38.5 mg/dL — AB (ref >39.00)
LDL CALC: 102 mg/dL — AB (ref 0–99)
Triglycerides: 70 mg/dL (ref 0.0–149.0)
VLDL: 14 mg/dL (ref 0.0–40.0)

## 2013-09-04 LAB — PSA: PSA: 1.5 ng/mL (ref 0.10–4.00)

## 2013-09-05 ENCOUNTER — Ambulatory Visit: Payer: BC Managed Care – PPO

## 2013-09-05 DIAGNOSIS — R7989 Other specified abnormal findings of blood chemistry: Secondary | ICD-10-CM

## 2013-09-05 LAB — HEMOGLOBIN A1C: Hgb A1c MFr Bld: 6 % (ref 4.6–6.5)

## 2013-09-07 ENCOUNTER — Ambulatory Visit (INDEPENDENT_AMBULATORY_CARE_PROVIDER_SITE_OTHER): Payer: BC Managed Care – PPO | Admitting: Family Medicine

## 2013-09-07 ENCOUNTER — Encounter: Payer: Self-pay | Admitting: Family Medicine

## 2013-09-07 VITALS — BP 136/82 | HR 66 | Temp 98.3°F | Ht 67.0 in | Wt 171.8 lb

## 2013-09-07 DIAGNOSIS — R7303 Prediabetes: Secondary | ICD-10-CM

## 2013-09-07 DIAGNOSIS — Z0000 Encounter for general adult medical examination without abnormal findings: Secondary | ICD-10-CM

## 2013-09-07 DIAGNOSIS — R7309 Other abnormal glucose: Secondary | ICD-10-CM

## 2013-09-07 NOTE — Patient Instructions (Signed)

## 2013-09-07 NOTE — Progress Notes (Signed)
Date:  09/07/2013   Name:  Steven Kemp   DOB:  1966-03-13   MRN:  [id number] Gender: male Age: 48 y.o.  Primary Physician:  Owens Loffler, MD   Chief Complaint: Annual Exam   Subjective:   History of Present Illness:  Steven Kemp is a 48 y.o. pleasant patient who presents with the following:  Preventative Health Maintenance Visit:  Health Maintenance Summary Reviewed and updated, unless pt declines services.  Tobacco History Reviewed. Alcohol: No concerns, no excessive use Exercise Habits: minimal now STD concerns: no risk or activity to increase risk Drug Use: None Encouraged self-testicular check  Health Maintenance  Topic Date Due  . Tetanus/tdap  04/05/1985  . Influenza Vaccine  01/20/2013     There is no immunization history on file for this patient.  Patient Active Problem List   Diagnosis Date Noted  . Borderline diabetes 09/08/2013  . BPPV (benign paroxysmal positional vertigo) 02/10/2013  . HYPERLIPIDEMIA 10/09/2009  . HYPERTENSION 10/09/2009  . EXTERNAL HEMORRHOIDS WITHOUT MENTION COMP 07/24/2009  . ALLERGIC  RHINITIS 02/03/2007    Past Medical History  Diagnosis Date  . Hyperlipidemia   . Hypertension     No past surgical history on file.  History   Social History  . Marital Status: Married    Spouse Name: N/A    Number of Children: 2  . Years of Education: N/A   Occupational History  . [employer]     Data base management   Social History Main Topics  . Smoking status: Never Smoker   . Smokeless tobacco: Never Used  . Alcohol Use: Yes     Comment: very rare  . Drug Use: No  . Sexual Activity: Not on file   Other Topics Concern  . Not on file   Social History Narrative  . No narrative on file    No family history on file.  No Known Allergies  Medication list has been reviewed and updated.  Review of Systems:  General: Denies fever, chills, sweats. No significant weight loss. Eyes: Denies  blurring,significant itching ENT: Denies earache, sore throat, and hoarseness. Cardiovascular: Denies chest pains, palpitations, dyspnea on exertion Respiratory: Denies cough, dyspnea at rest,wheeezing Breast: no concerns about lumps GI: Denies nausea, vomiting, diarrhea, constipation, change in bowel habits, abdominal pain, melena, hematochezia GU: Denies penile discharge, ED, urinary flow / outflow problems. No STD concerns. Musculoskeletal: Denies back pain, joint pain Derm: Denies rash, itching Neuro: Denies  paresthesias, frequent falls, frequent headaches Psych: Denies depression, anxiety. STRESS AT HOME - MOSTLY WITH WIFE, ARGUES SOME ABOUT KIDS, EX-WIFE'S INTERACTIONS AND PICKING UP KIDS Endocrine: Denies cold intolerance, heat intolerance, polydipsia Heme: Denies enlarged lymph nodes Allergy: No hayfever  Objective:   Physical Examination: BP 136/82  Pulse 66  Temp(Src) 98.3 F (36.8 C) (Oral)  Ht 5\' 7"  (1.702 m)  Wt 171 lb 12 oz (77.905 kg)  BMI 26.89 kg/m2  SpO2 96% Ideal Body Weight: Weight in (lb) to have BMI = 25: 159.3  GEN: well developed, well nourished, no acute distress Eyes: conjunctiva and lids normal, PERRLA, EOMI ENT: TM clear, nares clear, oral exam WNL Neck: supple, no lymphadenopathy, no thyromegaly, no JVD Pulm: clear to auscultation and percussion, respiratory effort normal CV: regular rate and rhythm, S1-S2, no murmur, rub or gallop, no bruits, peripheral pulses normal and symmetric, no cyanosis, clubbing, edema or varicosities GI: soft, non-tender; no hepatosplenomegaly, masses; active bowel sounds all quadrants GU: no hernia, testicular mass, penile discharge Lymph:  no cervical, axillary or inguinal adenopathy MSK: gait normal, muscle tone and strength WNL, no joint swelling, effusions, discoloration, crepitus  SKIN: clear, good turgor, color WNL, no rashes, lesions, or ulcerations Neuro: normal mental status, normal strength, sensation, and  motion Psych: alert; oriented to person, place and time, normally interactive and not anxious or depressed in appearance.  All labs reviewed with patient.  Lipids:    Component Value Date/Time   CHOL 154 09/04/2013 0751   TRIG 70.0 09/04/2013 0751   HDL 38.50* 09/04/2013 0751   LDLDIRECT 158.4 11/08/2007 0826   VLDL 14.0 09/04/2013 0751   CHOLHDL 4 09/04/2013 0751    CBC:    Component Value Date/Time   WBC 6.0 09/04/2013 0751   HGB 15.5 09/04/2013 0751   HCT 45.9 09/04/2013 0751   PLT 164.0 09/04/2013 0751   MCV 86.2 09/04/2013 0751   NEUTROABS 3.4 09/04/2013 0751   LYMPHSABS 1.7 09/04/2013 0751   MONOABS 0.5 09/04/2013 0751   EOSABS 0.4 09/04/2013 0751   BASOSABS 0.0 09/04/2013 3329    Basic Metabolic Panel:    Component Value Date/Time   NA 140 09/04/2013 0751   K 4.0 09/04/2013 0751   CL 104 09/04/2013 0751   CO2 30 09/04/2013 0751   BUN 12 09/04/2013 0751   CREATININE 1.0 09/04/2013 0751   GLUCOSE 153* 09/04/2013 0751   CALCIUM 9.3 09/04/2013 0751    Lab Results  Component Value Date   ALT 19 09/04/2013   AST 19 09/04/2013   ALKPHOS 73 09/04/2013   BILITOT 0.6 09/04/2013    Lab Results  Component Value Date   TSH 1.78 04/18/2009    Lab Results  Component Value Date   PSA 1.50 09/04/2013   PSA 1.41 04/09/2010   PSA 1.56 02/04/2007    Assessment & Plan:   Health Maintenance Exam: The patient's preventative maintenance and recommended screening tests for an annual wellness exam were reviewed in full today. Brought up to date unless services declined.  Counselled on the importance of diet, exercise, and its role in overall health and mortality. The patient's FH and SH was reviewed, including their home life, tobacco status, and drug and alcohol status.  Borderline diabetes  Maximize diet and exercise  No orders of the defined types were placed in this encounter.   Patient's Medications  New Prescriptions   No medications on file  Previous Medications   LISINOPRIL  (PRINIVIL,ZESTRIL) 10 MG TABLET    TAKE 1 TABLET ONCE DAILY FOR BLOOD PRESSURE   SIMVASTATIN (ZOCOR) 20 MG TABLET    TAKE 1 TABLET BY MOUTH ONCE A DAY FOR CHOLESTEROL  Modified Medications   No medications on file  Discontinued Medications   MECLIZINE (ANTIVERT) 25 MG TABLET    Take 1 tablet (25 mg total) by mouth 3 (three) times daily as needed for dizziness.   Patient Instructions   [facility] Low Glycemic Diet (Source: [facility], 2006)  Low Glycemic Foods (20-49) (Decrease risk of developing heart disease)  Best for Diabetes: Eat Mostly these  Breakfast Cereals: All-Bran All-Bran Fruit 'n Oats Fiber One Oatmeal (not instant) Oat bran  Fruits and fruit juices: (Limit to 1-2 servings per day) Apples Apricots (fresh & dried) Blackberries Blueberries Cherries Cranberries Peaches Pears Plums Prunes Grapefruit Raspberries Strawberries Tangerine  Juices: Apple juice Grapefruit juice Tomato juice  Beans and legumes (fresh-cooked): Black-eyed peas Butter beans Chick peas Lentils  Green beans Lima beans Kidney beans Navy beans Pinto beans [employer]  peas  Non-starchy vegetables: Asparagus, avocado, broccoli, cabbage, cauliflower, celery, cucumber, greens, lettuce, mushrooms, peppers, tomatoes, okra, onions, spinach, summer squash  Grains: Barley Bulgur Rye Wild rice  Nuts and oils : Almonds Peanuts Sunflower seeds Hazelnuts Pecans Walnuts Oils that are liquid at room temperature  Dairy, fish, meat, soy, and eggs: Milk, skim Lowfat cheese Yogurt, lowfat, fruit sugar sweetened Lean red meat Fish  Skinless chicken & [place] Shellfish Egg whites (up to 3 daily) Soy products  Egg yolks (up to 7 or _____ per week) Moderate Glycemic Foods (50-69)  OK sometimes with diabetes  Breakfast Cereals: Bran Buds Bran Chex Just Right Mini-Wheats  Special K Swiss muesli  Fruits: Banana (under-ripe) Dates Figs Grapes Kiwi Mango Oranges Raisins  Fruit  Juices: Cranberry juice Orange juice  Beans and legumes: [place]-type baked beans Canned pinto, kidney, or navy beans Green peas  Vegetables: Beets Carrots  Sweet potato Yam Corn on the cob  Breads: Pita (pocket) bread Oat bran bread Pumpernickel bread Rye bread Wheat bread, high fiber   Grains: Cornmeal Rice, brown Rice, white Couscous  Pasta: Macaroni Pizza, cheese Ravioli, meat filled Spaghetti, white   Nuts: Cashews Macadamia  Snacks: Chocolate Ice cream, lowfat Muffin Popcorn High Glycemic Foods (70-100)  Rare: Eat occaisionally with diabetes  THESE ARE THE WORST KIND OF FOODS FOR YOUR DIABETES  Breakfast Cereals: Cheerios Corn Chex Corn Flakes Cream of Wheat Grape Nuts Grape Nut Flakes Grits Nutri-Grain Puffed Rice Puffed Wheat Rice Chex Rice Krispies Shredded Wheat Team Total  Fruits: Pineapple Watermelon Banana (over-ripe) Beverages: Sodas, sweet tea, pineapple juice  Vegetables: Potato, baked, boiled, fried, mashed [place] fries Canned or frozen corn Parsnips Winter squash  Breads: Most breads (white and whole grain) Bagels Bread sticks Bread stuffing Kaiser roll Dinner rolls  Grains: Rice, instant Tapioca, with milk Candy and most cookies  Snacks: Donuts Corn chips Jelly beans Pretzels Pastries      Signed,  Kaityln Kallstrom T. Hung Rhinesmith, MD, [place] at [facility] [place] [address] Phone: [phone] Fax: [phone]

## 2013-09-07 NOTE — Progress Notes (Signed)
Pre visit review using our clinic review tool, if applicable. No additional management support is needed unless otherwise documented below in the visit note. 

## 2013-09-08 DIAGNOSIS — R7303 Prediabetes: Secondary | ICD-10-CM | POA: Insufficient documentation

## 2014-03-25 ENCOUNTER — Other Ambulatory Visit: Payer: Self-pay | Admitting: Family Medicine

## 2014-05-09 ENCOUNTER — Ambulatory Visit (INDEPENDENT_AMBULATORY_CARE_PROVIDER_SITE_OTHER): Payer: BC Managed Care – PPO | Admitting: Family Medicine

## 2014-05-09 ENCOUNTER — Ambulatory Visit: Payer: BC Managed Care – PPO | Admitting: Family Medicine

## 2014-05-09 ENCOUNTER — Encounter: Payer: Self-pay | Admitting: Family Medicine

## 2014-05-09 VITALS — BP 140/88 | HR 71 | Temp 98.1°F | Ht 67.0 in | Wt 182.0 lb

## 2014-05-09 DIAGNOSIS — M62838 Other muscle spasm: Secondary | ICD-10-CM

## 2014-05-09 DIAGNOSIS — R1013 Epigastric pain: Secondary | ICD-10-CM

## 2014-05-09 DIAGNOSIS — M6248 Contracture of muscle, other site: Secondary | ICD-10-CM

## 2014-05-09 NOTE — Progress Notes (Signed)
   Dr. Frederico Hamman T. Xolani Degracia, MD, [facility] [place], [address] Phone: [phone] Fax: [phone]  05/09/2014  Patient: Steven Kemp, MRN: [id number], DOB: 08/06/1965, 48 y.o.  Primary Physician:  Owens Loffler, MD  Chief Complaint: Back Pain and Abdominal Muscle Soreness  Subjective:   Steven Kemp is a 48 y.o. very pleasant male patient who presents with the following:  Having some pain in his upper abdomen. Pain in his upper touch and has been ongoing for 2 weeks. Can feel it with turning the car wheel. Feels somewhat in his back.   Eating fine.  47 month old - carrying carrier a lot Sleeping some.  Getting up some to feed.  No bothering with eating.   Past Medical History, Surgical History, Social History, Family History, Problem List, Medications, and Allergies have been reviewed and updated if relevant.   GEN: No acute illnesses, no fevers, chills. GI: No n/v/d, eating normally Pulm: No SOB Interactive and getting along well at home.  Otherwise, ROS is as per the HPI.  Objective:   BP 140/88 mmHg  Pulse 71  Temp(Src) 98.1 F (36.7 C) (Oral)  Ht 5\' 7"  (1.702 m)  Wt 182 lb (82.555 kg)  BMI 28.50 kg/m2  GEN: WDWN, NAD, Non-toxic, A & O x 3 HEENT: Atraumatic, Normocephalic. Neck supple. No masses, No LAD. Ears and Nose: No external deformity. CV: RRR, No M/G/R. No JVD. No thrill. No extra heart sounds. PULM: CTA B, no wheezes, crackles, rhonchi. No retractions. No resp. distress. No accessory muscle use. ABD: S, minimal epigastric tenderness, ND, + BS, No rebound, No HSM  EXTR: No c/c/e NEURO Normal gait.  PSYCH: Normally interactive. Conversant. Not depressed or anxious appearing.  Calm demeanor.   Laboratory and Imaging Data:  Assessment and Plan:   Trapezius muscle spasm  Dyspepsia  I reassured the patient.  I think that is spasm in his trapezius and tightness, more on his  RIGHT side is likely due to carrying his baby carrier.  I think he also probably has some mild dyspepsia.  I do not think it is muscular pain in the abdomen is consistent with musculoskeletal pathology.  Patient Instructions  Omeprazole each morning     Signed,  Tamilyn Lupien T. Danish Ruffins, MD   Patient's Medications  New Prescriptions   No medications on file  Previous Medications   LISINOPRIL (PRINIVIL,ZESTRIL) 10 MG TABLET    TAKE 1 TABLET ONCE DAILY FOR BLOOD PRESSURE   SIMVASTATIN (ZOCOR) 20 MG TABLET    TAKE 1 TABLET BY MOUTH ONCE A DAY FOR CHOLESTEROL  Modified Medications   No medications on file  Discontinued Medications   No medications on file

## 2014-05-09 NOTE — Patient Instructions (Signed)
Omeprazole each morning

## 2014-05-09 NOTE — Progress Notes (Signed)
Pre visit review using our clinic review tool, if applicable. No additional management support is needed unless otherwise documented below in the visit note. 

## 2014-09-26 ENCOUNTER — Encounter: Payer: BC Managed Care – PPO | Admitting: Family Medicine

## 2015-04-19 ENCOUNTER — Other Ambulatory Visit: Payer: Self-pay | Admitting: Family Medicine

## 2015-04-19 NOTE — Telephone Encounter (Signed)
Please schedule CPE with fasting labs prior for Dr. Copland.  

## 2015-04-19 NOTE — Telephone Encounter (Signed)
Spoke with pt  He will call back to schedule

## 2015-04-25 ENCOUNTER — Ambulatory Visit (INDEPENDENT_AMBULATORY_CARE_PROVIDER_SITE_OTHER): Payer: BC Managed Care – PPO | Admitting: Family Medicine

## 2015-04-25 ENCOUNTER — Encounter: Payer: Self-pay | Admitting: Family Medicine

## 2015-04-25 VITALS — BP 128/82 | HR 70 | Temp 98.6°F | Ht 67.0 in | Wt 183.0 lb

## 2015-04-25 DIAGNOSIS — S161XXA Strain of muscle, fascia and tendon at neck level, initial encounter: Secondary | ICD-10-CM

## 2015-04-25 MED ORDER — SIMVASTATIN 20 MG PO TABS: ORAL_TABLET | ORAL | Status: DC

## 2015-04-25 MED ORDER — CYCLOBENZAPRINE HCL 10 MG PO TABS: 10.0000 mg | ORAL_TABLET | Freq: Every evening | ORAL | Status: DC | PRN

## 2015-04-25 MED ORDER — LISINOPRIL 10 MG PO TABS: ORAL_TABLET | ORAL | Status: DC

## 2015-04-25 MED ORDER — DICLOFENAC SODIUM 75 MG PO TBEC: 75.0000 mg | DELAYED_RELEASE_TABLET | Freq: Two times a day (BID) | ORAL | Status: DC

## 2015-04-25 NOTE — Progress Notes (Signed)
Pre visit review using our clinic review tool, if applicable. No additional management support is needed unless otherwise documented below in the visit note. 

## 2015-04-25 NOTE — Patient Instructions (Signed)
Treat with diclofenac for pain and inflammation, muscle relaxant at night as needed, gentle stretching, heat and massage.  Remain out of work until follow up next Monday.
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
## 2015-04-25 NOTE — Assessment & Plan Note (Signed)
Treat with NSAID, muscle relaxant, gentle stretching, heat and massage.  Remain out of work until follow up next Monday.

## 2015-04-29 ENCOUNTER — Ambulatory Visit (INDEPENDENT_AMBULATORY_CARE_PROVIDER_SITE_OTHER): Payer: BC Managed Care – PPO | Admitting: Family Medicine

## 2015-04-29 ENCOUNTER — Encounter: Payer: Self-pay | Admitting: Family Medicine

## 2015-04-29 VITALS — BP 120/80 | HR 72 | Temp 98.6°F | Ht 67.0 in | Wt 184.0 lb

## 2015-04-29 DIAGNOSIS — S161XXD Strain of muscle, fascia and tendon at neck level, subsequent encounter: Secondary | ICD-10-CM | POA: Diagnosis not present

## 2015-04-29 MED ORDER — PREDNISONE 20 MG PO TABS: ORAL_TABLET | ORAL | Status: DC
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
## 2015-04-29 NOTE — Patient Instructions (Signed)
Cervical Sprain  A cervical sprain is an injury in the neck in which the strong, fibrous tissues (ligaments) that connect your neck bones stretch or tear. Cervical sprains can range from mild to severe. Severe cervical sprains can cause the neck vertebrae to be unstable. This can lead to damage of the spinal cord and can result in serious nervous system problems. The amount of time it takes for a cervical sprain to get better depends on the cause and extent of the injury. Most cervical sprains heal in 1 to 3 weeks.  CAUSES   Severe cervical sprains may be caused by:    Contact sport injuries (such as from football, rugby, wrestling, hockey, auto racing, gymnastics, diving, martial arts, or boxing).    Motor vehicle collisions.    Whiplash injuries. This is an injury from a sudden forward and backward whipping movement of the head and neck.   Falls.   Mild cervical sprains may be caused by:    Being in an awkward position, such as while cradling a telephone between your ear and shoulder.    Sitting in a chair that does not offer proper support.    Working at a poorly designed computer station.    Looking up or down for long periods of time.   SYMPTOMS    Pain, soreness, stiffness, or a burning sensation in the front, back, or sides of the neck. This discomfort may develop immediately after the injury or slowly, 24 hours or more after the injury.    Pain or tenderness directly in the middle of the back of the neck.    Shoulder or upper back pain.    Limited ability to move the neck.    Headache.    Dizziness.    Weakness, numbness, or tingling in the hands or arms.    Muscle spasms.    Difficulty swallowing or chewing.    Tenderness and swelling of the neck.   DIAGNOSIS   Most of the time your health care provider can diagnose a cervical sprain by taking your history and doing a physical exam. Your health care provider will ask about previous neck injuries and any known neck  problems, such as arthritis in the neck. X-rays may be taken to find out if there are any other problems, such as with the bones of the neck. Other tests, such as a CT scan or MRI, may also be needed.   TREATMENT   Treatment depends on the severity of the cervical sprain. Mild sprains can be treated with rest, keeping the neck in place (immobilization), and pain medicines. Severe cervical sprains are immediately immobilized. Further treatment is done to help with pain, muscle spasms, and other symptoms and may include:   Medicines, such as pain relievers, numbing medicines, or muscle relaxants.    Physical therapy. This may involve stretching exercises, strengthening exercises, and posture training. Exercises and improved posture can help stabilize the neck, strengthen muscles, and help stop symptoms from returning.   HOME CARE INSTRUCTIONS    Put ice on the injured area.     Put ice in a plastic bag.     Place a towel between your skin and the bag.     Leave the ice on for 15-20 minutes, 3-4 times a day.    If your injury was severe, you may have been given a cervical collar to wear. A cervical collar is a two-piece collar designed to keep your neck from moving while it heals.      Do not remove the collar unless instructed by your health care provider.    If you have long hair, keep it outside of the collar.    Ask your health care provider before making any adjustments to your collar. Minor adjustments may be required over time to improve comfort and reduce pressure on your chin or on the back of your head.    Ifyou are allowed to remove the collar for cleaning or bathing, follow your health care provider's instructions on how to do so safely.    Keep your collar clean by wiping it with mild soap and water and drying it completely. If the collar you have been given includes removable pads, remove them every 1-2 days and hand wash them with soap and water. Allow them to air dry. They should be completely  dry before you wear them in the collar.    If you are allowed to remove the collar for cleaning and bathing, wash and dry the skin of your neck. Check your skin for irritation or sores. If you see any, tell your health care provider.    Do not drive while wearing the collar.    Only take over-the-counter or prescription medicines for pain, discomfort, or fever as directed by your health care provider.    Keep all follow-up appointments as directed by your health care provider.    Keep all physical therapy appointments as directed by your health care provider.    Make any needed adjustments to your workstation to promote good posture.    Avoid positions and activities that make your symptoms worse.    Warm up and stretch before being active to help prevent problems.   SEEK MEDICAL CARE IF:    Your pain is not controlled with medicine.    You are unable to decrease your pain medicine over time as planned.    Your activity level is not improving as expected.   SEEK IMMEDIATE MEDICAL CARE IF:    You develop any bleeding.   You develop stomach upset.   You have signs of an allergic reaction to your medicine.    Your symptoms get worse.    You develop new, unexplained symptoms.    You have numbness, tingling, weakness, or paralysis in any part of your body.   MAKE SURE YOU:    Understand these instructions.   Will watch your condition.   Will get help right away if you are not doing well or get worse.     This information is not intended to replace advice given to you by your health care provider. Make sure you discuss any questions you have with your health care provider.     Document Released: 04/05/2007 Document Revised: 06/13/2013 Document Reviewed: 12/14/2012  Elsevier Interactive Patient Education 2016 [employer]

## 2015-04-29 NOTE — Progress Notes (Signed)
Pre visit review using our clinic review tool, if applicable. No additional management support is needed unless otherwise documented below in the visit note. 

## 2015-06-18 ENCOUNTER — Encounter: Payer: Self-pay | Admitting: Family Medicine

## 2015-06-18 ENCOUNTER — Ambulatory Visit (INDEPENDENT_AMBULATORY_CARE_PROVIDER_SITE_OTHER): Payer: BC Managed Care – PPO | Admitting: Family Medicine

## 2015-06-18 VITALS — BP 120/80 | HR 102 | Temp 98.6°F | Ht 67.0 in | Wt 175.5 lb

## 2015-06-18 DIAGNOSIS — E785 Hyperlipidemia, unspecified: Secondary | ICD-10-CM

## 2015-06-18 DIAGNOSIS — R5383 Other fatigue: Secondary | ICD-10-CM | POA: Diagnosis not present

## 2015-06-18 DIAGNOSIS — R7303 Prediabetes: Secondary | ICD-10-CM | POA: Diagnosis not present

## 2015-06-18 NOTE — Progress Notes (Signed)
Pre visit review using our clinic review tool, if applicable. No additional management support is needed unless otherwise documented below in the visit note. 

## 2015-06-18 NOTE — Progress Notes (Signed)
Dr. Frederico Hamman T. Caileb Rhue, MD, [facility] [name], [address] Phone: [phone] Fax: [phone]  06/18/2015  Patient: Steven Kemp, MRN: [id number], DOB: [birth year], 49 y.o.  Primary Physician:  Owens Loffler, MD   Chief Complaint  Patient presents with  . Fatigue   Subjective:   Steven Kemp is a 49 y.o. very pleasant male patient who presents with the following:  Decreased libido for 6 months.   The patient comes in with a primary complaint of having some fatigue increased for about the last 6 weeks. 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 He has been on both Flexeril, Robaxin, and also tramadol.  He has not had any blood work in some time, and he was borderline diabetic the last time we checked any blood.  He also has had some decreased libido for the last 6 months.  Past Medical History, Surgical History, Social History, Family History, Problem List, Medications, and Allergies have been reviewed and updated if relevant.  Patient Active Problem List   Diagnosis Date Noted  . Acute strain of neck muscle 04/25/2015  . Borderline diabetes 09/08/2013  . BPPV (benign paroxysmal positional vertigo) 02/10/2013  . HYPERLIPIDEMIA 10/09/2009  . HYPERTENSION 10/09/2009  . EXTERNAL HEMORRHOIDS WITHOUT MENTION COMP 07/24/2009  . ALLERGIC  RHINITIS 02/03/2007    Past Medical History  Diagnosis Date  . Hyperlipidemia   . Hypertension     No past surgical history on file.  Social History   Social History  . Marital Status: Married    Spouse Name: N/A  . Number of Children: 2  . Years of Education: N/A   Occupational History  . [employer]     Data base management   Social History Main Topics  . Smoking status: Never Smoker   . Smokeless  tobacco: Never Used  . Alcohol Use: Yes     Comment: very rare  . Drug Use: No  . Sexual Activity: Not on file   Other Topics Concern  . Not on file   Social History Narrative    No family history on file.  No Known Allergies  Medication list reviewed and updated in full in Center.   GEN: No acute illnesses, no fevers, chills. GI: No n/v/d, eating normally Pulm: No SOB Interactive and getting along well at home.  Otherwise, ROS is as per the HPI.  Objective:   BP 120/80 mmHg  Pulse 102  Temp(Src) 98.6 F (37 C) (Oral)  Ht 5\' 7"  (1.702 m)  Wt 175 lb 8 oz (79.606 kg)  BMI 27.48 kg/m2  GEN: WDWN, NAD, Non-toxic, A & O x 3 HEENT: Atraumatic, Normocephalic. Neck supple. No masses, No LAD. Ears and Nose: No external deformity. CV: RRR, No M/G/R. No JVD. No thrill. No extra heart sounds. PULM: CTA B, no wheezes, crackles, rhonchi. No retractions. No resp. distress. No accessory muscle use. EXTR: No c/c/e NEURO Normal gait.  PSYCH: Normally interactive. Conversant. Not depressed or anxious appearing.  Calm demeanor.   Laboratory and Imaging Data:  Assessment and Plan:   Other fatigue - Plan: CBC with Differential/Platelet, Basic metabolic panel, Vitamin 123456, Hepatic function panel, TSH, VITAMIN D 25 Hydroxy (Vit-D Deficiency, Fractures), Testosterone, Free, Total, SHBG  Hyperlipidemia LDL goal <100 - Plan: LDL cholesterol,  direct  Borderline diabetes - Plan: Hemoglobin A1c  Suspicious that this is most likely medication effect.  We will nevertheless do a thorough workup, and he is overdue for routine laboratories anyway.  Follow-up: No Follow-up on file.  Orders Placed This Encounter  Procedures  . CBC with Differential/Platelet  . Basic metabolic panel  . Vitamin B12  . Hepatic function panel  . TSH  . VITAMIN D 25 Hydroxy (Vit-D Deficiency, Fractures)  . LDL cholesterol, direct  . Hemoglobin A1c  . Testosterone, Free, Total, SHBG     Signed,  Minerva Bluett T. Allani Reber, MD   Patient's Medications  New Prescriptions   No medications on file  Previous Medications   LISINOPRIL (PRINIVIL,ZESTRIL) 10 MG TABLET    TAKE 1 TABLET ONCE DAILY FOR BLOOD PRESSURE   MELOXICAM (MOBIC) 15 MG TABLET    Take 15 mg by mouth daily.   METHOCARBAMOL (ROBAXIN) 750 MG TABLET    TAKE 1 TABLET BY MOUTH EVERY 6-8 HOURS AS NEEDED FOR SPASMS   SIMVASTATIN (ZOCOR) 20 MG TABLET    TAKE 1 TABLET BY MOUTH ONCE A DAY FOR CHOLESTEROL   TRAMADOL (ULTRAM) 50 MG TABLET    TAKE 1-2 TABLETS BY MOUTH 3 TIMES DAILY  Modified Medications   No medications on file  Discontinued Medications   CYCLOBENZAPRINE (FLEXERIL) 10 MG TABLET    Take 1 tablet (10 mg total) by mouth at bedtime as needed for muscle spasms.   DICLOFENAC (VOLTAREN) 75 MG EC TABLET    Take 1 tablet (75 mg total) by mouth 2 (two) times daily.   PREDNISONE (DELTASONE) 20 MG TABLET    2 tabs po daily for 5 days, then 1 tab po daily for 5 days

## 2015-06-19 LAB — CBC WITH DIFFERENTIAL/PLATELET
BASOS PCT: 0.5 % (ref 0.0–3.0)
Basophils Absolute: 0.1 10*3/uL (ref 0.0–0.1)
EOS PCT: 3 % (ref 0.0–5.0)
Eosinophils Absolute: 0.3 10*3/uL (ref 0.0–0.7)
HEMATOCRIT: 49.4 % (ref 39.0–52.0)
Hemoglobin: 16.4 g/dL (ref 13.0–17.0)
LYMPHS ABS: 1.2 10*3/uL (ref 0.7–4.0)
LYMPHS PCT: 10.4 % — AB (ref 12.0–46.0)
MCHC: 33.3 g/dL (ref 30.0–36.0)
MCV: 85.2 fl (ref 78.0–100.0)
MONOS PCT: 8 % (ref 3.0–12.0)
Monocytes Absolute: 0.9 10*3/uL (ref 0.1–1.0)
NEUTROS ABS: 8.6 10*3/uL — AB (ref 1.4–7.7)
NEUTROS PCT: 78.1 % — AB (ref 43.0–77.0)
PLATELETS: 220 10*3/uL (ref 150.0–400.0)
RBC: 5.8 Mil/uL (ref 4.22–5.81)
RDW: 12.2 % (ref 11.5–15.5)
WBC: 11.1 10*3/uL — ABNORMAL HIGH (ref 4.0–10.5)

## 2015-06-19 LAB — LDL CHOLESTEROL, DIRECT: Direct LDL: 103 mg/dL

## 2015-06-19 LAB — TESTOSTERONE, FREE, TOTAL, SHBG
SEX HORMONE BINDING: 38 nmol/L (ref 10–50)
TESTOSTERONE FREE: 40.3 pg/mL — AB (ref 47.0–244.0)
TESTOSTERONE-% FREE: 1.8 % (ref 1.6–2.9)
TESTOSTERONE: 229 ng/dL — AB (ref 300–890)

## 2015-06-19 LAB — BASIC METABOLIC PANEL
BUN: 18 mg/dL (ref 6–23)
CALCIUM: 10 mg/dL (ref 8.4–10.5)
CO2: 26 mEq/L (ref 19–32)
CREATININE: 1.01 mg/dL (ref 0.40–1.50)
Chloride: 103 mEq/L (ref 96–112)
GFR: 100.89 mL/min (ref >60.00)
Glucose, Bld: 112 mg/dL — ABNORMAL HIGH (ref 70–99)
Potassium: 4.4 mEq/L (ref 3.5–5.1)
Sodium: 140 mEq/L (ref 135–145)

## 2015-06-19 LAB — HEPATIC FUNCTION PANEL
ALBUMIN: 4.4 g/dL (ref 3.5–5.2)
ALT: 21 U/L (ref 0–53)
AST: 18 U/L (ref 0–37)
Alkaline Phosphatase: 85 U/L (ref 39–117)
BILIRUBIN TOTAL: 0.4 mg/dL (ref 0.2–1.2)
Bilirubin, Direct: 0.1 mg/dL (ref 0.0–0.3)
Total Protein: 7.9 g/dL (ref 6.0–8.3)

## 2015-06-19 LAB — TSH: TSH: 2.2 u[IU]/mL (ref 0.35–4.50)

## 2015-06-19 LAB — VITAMIN B12: VITAMIN B 12: 367 pg/mL (ref 211–911)

## 2015-06-19 LAB — VITAMIN D 25 HYDROXY (VIT D DEFICIENCY, FRACTURES): VITD: 14.15 ng/mL — AB (ref 30.00–100.00)

## 2015-06-19 LAB — HEMOGLOBIN A1C: HEMOGLOBIN A1C: 6.3 % (ref 4.6–6.5)

## 2015-06-25 ENCOUNTER — Telehealth: Payer: Self-pay | Admitting: Family Medicine

## 2015-06-25 NOTE — Telephone Encounter (Signed)
Pt calling regarding labs. Pease call back  cb number [phone]

## 2015-06-27 MED ORDER — VITAMIN D (ERGOCALCIFEROL) 1.25 MG (50000 UNIT) PO CAPS: 50000.0000 [IU] | ORAL_CAPSULE | ORAL | Status: DC

## 2015-06-27 NOTE — Telephone Encounter (Signed)
Lab results given via telephone.  Vit D 50,000 unit prescription sent into [facility].  See Lab result note.

## 2015-09-05 ENCOUNTER — Ambulatory Visit (INDEPENDENT_AMBULATORY_CARE_PROVIDER_SITE_OTHER): Payer: BC Managed Care – PPO | Admitting: Internal Medicine

## 2015-09-05 ENCOUNTER — Encounter: Payer: Self-pay | Admitting: Internal Medicine

## 2015-09-05 ENCOUNTER — Telehealth: Payer: Self-pay | Admitting: *Deleted

## 2015-09-05 VITALS — BP 122/80 | HR 90 | Temp 98.1°F | Wt 183.0 lb

## 2015-09-05 DIAGNOSIS — J209 Acute bronchitis, unspecified: Secondary | ICD-10-CM

## 2015-09-05 MED ORDER — HYDROCOD POLST-CPM POLST ER 10-8 MG/5ML PO SUER: 5.0000 mL | Freq: Every evening | ORAL | Status: DC | PRN

## 2015-09-05 MED ORDER — AZITHROMYCIN 250 MG PO TABS: ORAL_TABLET | ORAL | Status: DC

## 2015-09-05 MED ORDER — METHYLPREDNISOLONE ACETATE 80 MG/ML IJ SUSP
80.0000 mg | Freq: Once | INTRAMUSCULAR | Status: AC
  Administered 2015-09-05: 80 mg via INTRAMUSCULAR

## 2015-09-05 NOTE — Progress Notes (Signed)
HPI  Pt presents to the clinic today with c/o runny nose, cough, fever, chills and body aches. He reports this started a 2 weeks ago. He is blowing clear mucous out of his nose. The cough is productive of yellow mucous. He denies shortness of breath but does have some chest tightness. He has taken Corcidin without any relief. He has no history of seasonal allergies or breathing problems. He has had sick contacts. He did not get a flu shot.  Review of Systems      Past Medical History  Diagnosis Date  . Hyperlipidemia   . Hypertension     No family history on file.  Social History   Social History  . Marital Status: Married    Spouse Name: N/A  . Number of Children: 2  . Years of Education: N/A   Occupational History  . [employer]     Data base management   Social History Main Topics  . Smoking status: Never Smoker   . Smokeless tobacco: Never Used  . Alcohol Use: Yes     Comment: very rare  . Drug Use: No  . Sexual Activity: Not on file   Other Topics Concern  . Not on file   Social History Narrative    No Known Allergies   Constitutional: Positive fatigue and fever. Denies headache, abrupt weight changes.  HEENT:  Positive runny nose, nasal congestion and sore throat. Denies eye redness, eye pain, pressure behind the eyes, facial pain, ear pain, ringing in the ears, wax buildup, or bloody nose. Respiratory: Positive cough. Denies difficulty breathing or shortness of breath.  Cardiovascular: Denies chest pain, chest tightness, palpitations or swelling in the hands or feet.   No other specific complaints in a complete review of systems (except as listed in HPI above).  Objective:   Wt Readings from Last 3 Encounters:  06/18/15 175 lb 8 oz (79.606 kg)  04/29/15 184 lb (83.462 kg)  04/25/15 183 lb (83.008 kg)     General: Appears his stated age, in NAD. HEENT: Head: normal shape and size, no sinus tenderness noted; Eyes: sclera white, no icterus,  conjunctiva pink; Ears: Tm's gray and intact, normal light reflex; Nose: mucosa boggy and moist, septum midline; Throat/Mouth: + PND. Teeth present, mucosa erythematous and moist, no exudate noted, no lesions or ulcerations noted.  Neck: No ervical lymphadenopathy.  Cardiovascular: Normal rate and rhythm. S1,S2 noted.  No murmur, rubs or gallops noted.  Pulmonary/Chest: Normal effort with scattered ronchi and bilateral expiratory wheeze. No respiratory distress.       Assessment & Plan:   Acute Bronchitis  Get some rest and drink plenty of water Do salt water gargles for the sore throat eRx for Azithromax x 5 days Rx for Tussionex cough syrup 80 mg Depo IM  Flonase OTC for nasal congestion Ibuprofen for fever and body aches  RTC as needed or if symptoms persist.

## 2015-09-05 NOTE — Patient Instructions (Signed)

## 2015-09-05 NOTE — Telephone Encounter (Signed)
[facility] Patient Name: MANOA ANDRESS Gender: Male DOB: Nov 08, 1965 Age: 50 Y 5 M 1 D Return Phone Number: [phone] (Primary) [place] [address] Client [facility] - Client Client Site [place] - Day Physician [place], [place] Type Call Who Is Calling Patient / Member / Family / Caregiver Call Type Triage / Clinical Relationship To Patient Self Return Phone Number [phone] (Primary) Chief Complaint Dizziness Reason for Call Symptomatic / Request for Health Information Initial Comment Caller states, light headed, dizzy, flu like Sx, fever and body aches for the last 2 weeks Appointment Disposition EMR Caller Not Reached Info pasted into Epic No Translation No Nurse Assessment Guidelines Guideline Title Affirmed Question Affirmed Notes Nurse Date/Time (Eastern Time) Disp. Time Eilene Ghazi Time) Disposition Final User 09/05/2015 10:38:36 AM Attempt made - message left Jackqulyn Livings 09/05/2015 10:54:46 AM Attempt made - no message left Vivianne Master, Sherre Poot 09/05/2015 10:56:31 AM FINAL ATTEMPT MADE - message left Yes Markus Daft, RN, Sherre Poot

## 2015-09-05 NOTE — Addendum Note (Signed)
Addended by: Pilar Grammes on: 09/05/2015 04:44 PM   Modules accepted: Orders

## 2015-09-05 NOTE — Progress Notes (Signed)
Pre visit review using our clinic review tool, if applicable. No additional management support is needed unless otherwise documented below in the visit note. 

## 2015-09-05 NOTE — Telephone Encounter (Signed)
Pt has appt scheduled with Webb Silversmith today at 4:15.

## 2015-09-06 ENCOUNTER — Ambulatory Visit: Payer: BC Managed Care – PPO | Admitting: Family Medicine

## 2015-09-19 ENCOUNTER — Ambulatory Visit: Payer: BC Managed Care – PPO | Admitting: Family Medicine

## 2015-09-19 ENCOUNTER — Encounter: Payer: Self-pay | Admitting: Family Medicine

## 2015-09-19 ENCOUNTER — Ambulatory Visit (INDEPENDENT_AMBULATORY_CARE_PROVIDER_SITE_OTHER): Payer: BC Managed Care – PPO | Admitting: Family Medicine

## 2015-09-19 VITALS — BP 120/80 | HR 78 | Temp 98.5°F | Ht 67.0 in | Wt 176.8 lb

## 2015-09-19 DIAGNOSIS — J189 Pneumonia, unspecified organism: Secondary | ICD-10-CM

## 2015-09-19 MED ORDER — LEVOFLOXACIN 500 MG PO TABS: 500.0000 mg | ORAL_TABLET | Freq: Every day | ORAL | Status: DC

## 2015-09-19 MED ORDER — PREDNISONE 20 MG PO TABS: ORAL_TABLET | ORAL | Status: DC

## 2015-09-19 NOTE — Progress Notes (Signed)
Pre visit review using our clinic review tool, if applicable. No additional management support is needed unless otherwise documented below in the visit note. 

## 2015-09-19 NOTE — Progress Notes (Signed)
Dr. Frederico Hamman T. Mecca Barga, MD, [facility] [place], [address] Phone: [phone] Fax: [phone]  09/19/2015  Patient: Steven Kemp, MRN: [id number], DOB: 1965/12/20, 50 y.o.  Primary Physician:  Owens Loffler, MD   Chief Complaint  Patient presents with  . Cough    seen on 3/16-still coughing   Subjective:   Steven Kemp is a 50 y.o. very pleasant male patient who presents with the following:  Patient seen by Mrs. Baity on 3/16 dx bronchitis and given Zpak and depomedrol 80 mg IM.  He actually took a Z-Pak incorrectly, and did not take it  Exactly as directed.  He is persistently now been coughing and feeling sick for one month.  He is not feeling like he is any better, and he actually might be worse.  He is currently afebrile, but he is having some difficulty breathing and he is coughing up mucus and and chunks of material quite often.  He does feel as if he is having some shortness of breath and wheezing 2, and he has no baseline lung disease.  Persistent cough with abnormal lung exam.  Wheezing some, too.   R PNA CAP  Past Medical History, Surgical History, Social History, Family History, Problem List, Medications, and Allergies have been reviewed and updated if relevant.  Patient Active Problem List   Diagnosis Date Noted  . Acute strain of neck muscle 04/25/2015  . Borderline diabetes 09/08/2013  . BPPV (benign paroxysmal positional vertigo) 02/10/2013  . Hyperlipidemia LDL goal <100 10/09/2009  . HYPERTENSION 10/09/2009  . EXTERNAL HEMORRHOIDS WITHOUT MENTION COMP 07/24/2009  . ALLERGIC  RHINITIS 02/03/2007    Past Medical History  Diagnosis Date  . Hyperlipidemia   . Hypertension     No past surgical history on file.  Social History   Social History  . Marital Status: Married    Spouse Name: N/A  . Number of Children: 2  . Years of Education: N/A   Occupational History  .  [employer]     Data base management   Social History Main Topics  . Smoking status: Never Smoker   . Smokeless tobacco: Never Used  . Alcohol Use: Yes     Comment: very rare  . Drug Use: No  . Sexual Activity: Not on file   Other Topics Concern  . Not on file   Social History Narrative    No family history on file.  No Known Allergies  Medication list reviewed and updated in full in [facility].  ROS: GEN: Acute illness details above GI: Tolerating PO intake GU: maintaining adequate hydration and urination Pulm: No SOB Interactive and getting along well at home.  Otherwise, ROS is as per the HPI.   Objective:   BP 120/80 mmHg  Pulse 78  Temp(Src) 98.5 F (36.9 C) (Oral)  Ht 5\' 7"  (1.702 m)  Wt 176 lb 12 oz (80.173 kg)  BMI 27.68 kg/m2  SpO2 97%   GEN: WDWN, NAD, Non-toxic, A & O x 3 HEENT: Atraumatic, Normocephalic. Neck supple. No masses, No LAD. Ears and Nose: No external deformity. CV: RRR, No M/G/R. No JVD. No thrill. No extra heart sounds. PULM: patient does have some rare wheezing throughout both lung fields, and there are both crackles and rhonchi on the right side.   Minimal rhonchi are present on the left. No resp. distress. No accessory muscle use. EXTR: No c/c/e NEURO Normal gait.  PSYCH: Normally interactive. Conversant. Not depressed or anxious appearing.  Calm demeanor.     Laboratory and Imaging Data:  Assessment and Plan:   CAP (community acquired pneumonia)  tto me appears a have an initial treatment failure of Zithromax with community-acquired pneumonia.  Heels also wheezing, so I'm going to give him some prednisone in addition to Levaquin.  Follow-up: No Follow-up on file.  New Prescriptions   LEVOFLOXACIN (LEVAQUIN) 500 MG TABLET    Take 1 tablet (500 mg total) by mouth daily.   PREDNISONE (DELTASONE) 20 MG TABLET    2 tabs po for 5 days, then 1 tab po for 3 days   Signed,  [name], MD   Patient's  Medications  New Prescriptions   LEVOFLOXACIN (LEVAQUIN) 500 MG TABLET    Take 1 tablet (500 mg total) by mouth daily.   PREDNISONE (DELTASONE) 20 MG TABLET    2 tabs po for 5 days, then 1 tab po for 3 days  Previous Medications   LISINOPRIL (PRINIVIL,ZESTRIL) 10 MG TABLET    TAKE 1 TABLET ONCE DAILY FOR BLOOD PRESSURE   MELOXICAM (MOBIC) 15 MG TABLET    Take 15 mg by mouth daily.   METHOCARBAMOL (ROBAXIN) 750 MG TABLET    TAKE 1 TABLET BY MOUTH EVERY 6-8 HOURS AS NEEDED FOR SPASMS   SIMVASTATIN (ZOCOR) 20 MG TABLET    TAKE 1 TABLET BY MOUTH ONCE A DAY FOR CHOLESTEROL  Modified Medications   No medications on file  Discontinued Medications   AZITHROMYCIN (ZITHROMAX) 250 MG TABLET    Take 2 tabs today, then 1 tab daily x 4 days   CHLORPHENIRAMINE-HYDROCODONE (TUSSIONEX PENNKINETIC ER) 10-8 MG/5ML SUER    Take 5 mLs by mouth at bedtime as needed for cough.   TRAMADOL (ULTRAM) 50 MG TABLET    TAKE 1-2 TABLETS BY MOUTH 3 TIMES DAILY   VITAMIN D, ERGOCALCIFEROL, (DRISDOL) 50000 UNITS CAPS CAPSULE    Take 1 capsule (50,000 Units total) by mouth every 7 (seven) days.

## 2015-09-22 ENCOUNTER — Other Ambulatory Visit: Payer: Self-pay | Admitting: Family Medicine

## 2015-09-22 NOTE — Telephone Encounter (Signed)
He should be fine to switch to OTC Vit D, 2000 units a day

## 2015-09-22 NOTE — Telephone Encounter (Signed)
Last office visit 09/19/2015.  Not on current medication list.  Refill?

## 2015-09-23 NOTE — Telephone Encounter (Signed)
Steven Kemp notified to switch to OTC Vitamin D 2000 units once a day.

## 2015-11-08 ENCOUNTER — Other Ambulatory Visit: Payer: Self-pay | Admitting: Family Medicine

## 2015-11-10 ENCOUNTER — Other Ambulatory Visit: Payer: Self-pay | Admitting: Family Medicine

## 2015-11-11 NOTE — Telephone Encounter (Signed)
Last office visit 09/19/2015 for CAP.  Last CPE 09/07/2013.  Refill?

## 2016-02-10 ENCOUNTER — Other Ambulatory Visit: Payer: Self-pay | Admitting: Family Medicine

## 2016-02-10 NOTE — Telephone Encounter (Signed)
Last office visit 09/19/2015 for CAP.  Last CPE 09/07/2013.  Last labs 06/18/2015.  Ok to refill?

## 2016-05-28 ENCOUNTER — Other Ambulatory Visit: Payer: Self-pay | Admitting: Family Medicine

## 2016-08-25 ENCOUNTER — Other Ambulatory Visit: Payer: Self-pay | Admitting: Family Medicine

## 2016-09-22 ENCOUNTER — Other Ambulatory Visit: Payer: Self-pay

## 2016-09-22 MED ORDER — SIMVASTATIN 20 MG PO TABS: ORAL_TABLET | ORAL | 0 refills | Status: DC

## 2016-09-22 MED ORDER — LISINOPRIL 10 MG PO TABS: ORAL_TABLET | ORAL | 0 refills | Status: DC

## 2016-09-22 NOTE — Telephone Encounter (Signed)
Pt requesting refill lisinopril and simvastatin; pt has CPX scheduled for 10/14/16. Advised pt refill done but needs to keep CPX appt. Pt voiced understanding. Refill done.

## 2016-10-12 ENCOUNTER — Other Ambulatory Visit (INDEPENDENT_AMBULATORY_CARE_PROVIDER_SITE_OTHER): Payer: Commercial Managed Care - PPO

## 2016-10-12 DIAGNOSIS — Z0000 Encounter for general adult medical examination without abnormal findings: Secondary | ICD-10-CM | POA: Diagnosis not present

## 2016-10-12 DIAGNOSIS — R7303 Prediabetes: Secondary | ICD-10-CM | POA: Diagnosis not present

## 2016-10-12 LAB — CBC WITH DIFFERENTIAL/PLATELET
BASOS PCT: 0.2 % (ref 0.0–3.0)
Basophils Absolute: 0 10*3/uL (ref 0.0–0.1)
EOS PCT: 4.5 % (ref 0.0–5.0)
Eosinophils Absolute: 0.2 10*3/uL (ref 0.0–0.7)
HEMATOCRIT: 47 % (ref 39.0–52.0)
HEMOGLOBIN: 16.1 g/dL (ref 13.0–17.0)
Lymphocytes Relative: 32.1 % (ref 12.0–46.0)
Lymphs Abs: 1.6 10*3/uL (ref 0.7–4.0)
MCHC: 34.4 g/dL (ref 30.0–36.0)
MCV: 84.9 fl (ref 78.0–100.0)
MONOS PCT: 10.9 % (ref 3.0–12.0)
Monocytes Absolute: 0.6 10*3/uL (ref 0.1–1.0)
NEUTROS PCT: 52.3 % (ref 43.0–77.0)
Neutro Abs: 2.7 10*3/uL (ref 1.4–7.7)
Platelets: 186 10*3/uL (ref 150.0–400.0)
RBC: 5.54 Mil/uL (ref 4.22–5.81)
RDW: 12.7 % (ref 11.5–15.5)
WBC: 5.1 10*3/uL (ref 4.0–10.5)

## 2016-10-12 LAB — LIPID PANEL
CHOLESTEROL: 133 mg/dL (ref 0–200)
HDL: 33 mg/dL — ABNORMAL LOW (ref >39.00)
LDL Cholesterol: 80 mg/dL (ref 0–99)
NONHDL: 99.56
Total CHOL/HDL Ratio: 4
Triglycerides: 100 mg/dL (ref 0.0–149.0)
VLDL: 20 mg/dL (ref 0.0–40.0)

## 2016-10-12 LAB — HEMOGLOBIN A1C: HEMOGLOBIN A1C: 6.3 % (ref 4.6–6.5)

## 2016-10-12 LAB — PSA: PSA: 1.51 ng/mL (ref 0.10–4.00)

## 2016-10-13 ENCOUNTER — Other Ambulatory Visit (INDEPENDENT_AMBULATORY_CARE_PROVIDER_SITE_OTHER): Payer: Commercial Managed Care - PPO

## 2016-10-13 ENCOUNTER — Other Ambulatory Visit: Payer: Self-pay | Admitting: Family Medicine

## 2016-10-13 DIAGNOSIS — I10 Essential (primary) hypertension: Secondary | ICD-10-CM

## 2016-10-13 DIAGNOSIS — E785 Hyperlipidemia, unspecified: Secondary | ICD-10-CM

## 2016-10-13 LAB — BASIC METABOLIC PANEL
BUN: 18 mg/dL (ref 6–23)
CHLORIDE: 102 meq/L (ref 96–112)
CO2: 29 mEq/L (ref 19–32)
Calcium: 9.3 mg/dL (ref 8.4–10.5)
Creatinine, Ser: 0.92 mg/dL (ref 0.40–1.50)
GFR: 111.76 mL/min (ref >60.00)
GLUCOSE: 105 mg/dL — AB (ref 70–99)
POTASSIUM: 4.4 meq/L (ref 3.5–5.1)
Sodium: 138 mEq/L (ref 135–145)

## 2016-10-14 ENCOUNTER — Ambulatory Visit (INDEPENDENT_AMBULATORY_CARE_PROVIDER_SITE_OTHER): Payer: Commercial Managed Care - PPO | Admitting: Family Medicine

## 2016-10-14 ENCOUNTER — Other Ambulatory Visit (INDEPENDENT_AMBULATORY_CARE_PROVIDER_SITE_OTHER): Payer: Commercial Managed Care - PPO

## 2016-10-14 ENCOUNTER — Encounter: Payer: Self-pay | Admitting: Family Medicine

## 2016-10-14 VITALS — BP 130/86 | HR 72 | Temp 98.3°F | Ht 67.0 in | Wt 187.2 lb

## 2016-10-14 DIAGNOSIS — I10 Essential (primary) hypertension: Secondary | ICD-10-CM | POA: Diagnosis not present

## 2016-10-14 DIAGNOSIS — Z0000 Encounter for general adult medical examination without abnormal findings: Secondary | ICD-10-CM

## 2016-10-14 LAB — HEPATIC FUNCTION PANEL
ALT: 20 U/L (ref 0–53)
AST: 18 U/L (ref 0–37)
Albumin: 4.4 g/dL (ref 3.5–5.2)
Alkaline Phosphatase: 69 U/L (ref 39–117)
BILIRUBIN TOTAL: 0.3 mg/dL (ref 0.2–1.2)
Bilirubin, Direct: 0.1 mg/dL (ref 0.0–0.3)
TOTAL PROTEIN: 7.3 g/dL (ref 6.0–8.3)

## 2016-10-14 MED ORDER — SILDENAFIL CITRATE 20 MG PO TABS: ORAL_TABLET | ORAL | 11 refills | Status: DC

## 2016-10-14 NOTE — Progress Notes (Signed)
Pre visit review using our clinic review tool, if applicable. No additional management support is needed unless otherwise documented below in the visit note. 

## 2016-10-14 NOTE — Progress Notes (Signed)
Dr. Frederico Hamman T. Wilton Thrall, MD, [facility] [place], [address] Phone: [phone] Fax: [phone]  10/14/2016  Patient: Steven Kemp, MRN: [id number], DOB: [birth year], 51 y.o.  Primary Physician:  Owens Loffler, MD   Chief Complaint  Patient presents with  . Annual Exam   Subjective:   Steven Kemp is a 51 y.o. pleasant patient who presents with the following:  Preventative Health Maintenance Visit:  Health Maintenance Summary Reviewed and updated, unless pt declines services.  Tobacco History Reviewed. Alcohol: No concerns, no excessive use Exercise Habits: rare activity, rec at least 30 mins 5 times a week STD concerns: no risk or activity to increase risk Drug Use: None Encouraged self-testicular check  Colon - cologuard.  Coronary ct score  Decreased libido.   Wonders about his testosterone.   Health Maintenance  Topic Date Due  . HIV Screening  04/05/1981  . COLONOSCOPY  04/05/2016  . TETANUS/TDAP  10/14/2017 (Originally 04/05/1985)  . INFLUENZA VACCINE  01/20/2017    There is no immunization history on file for this patient. Patient Active Problem List   Diagnosis Date Noted  . Borderline diabetes 09/08/2013  . BPPV (benign paroxysmal positional vertigo) 02/10/2013  . Hyperlipidemia LDL goal <100 10/09/2009  . HYPERTENSION 10/09/2009  . EXTERNAL HEMORRHOIDS WITHOUT MENTION COMP 07/24/2009  . ALLERGIC  RHINITIS 02/03/2007   Past Medical History:  Diagnosis Date  . Hyperlipidemia   . Hypertension    No past surgical history on file. Social History   Social History  . Marital status: Married    Spouse name: N/A  . Number of children: 2  . Years of education: N/A   Occupational History  . [employer]    Data base management   Social History Main Topics  . Smoking status: Never Smoker  . Smokeless tobacco: Never Used  . Alcohol use Yes   Comment: very rare  . Drug use: No  . Sexual activity: Not on file   Other Topics Concern  . Not on file   Social History Narrative  . No narrative on file   No family history on file. No Known Allergies  Medication list has been reviewed and updated.   General: Denies fever, chills, sweats. No significant weight loss. Eyes: Denies blurring,significant itching ENT: Denies earache, sore throat, and hoarseness. Cardiovascular: Denies chest pains, palpitations, dyspnea on exertion Respiratory: Denies cough, dyspnea at rest,wheeezing Breast: no concerns about lumps GI: Denies nausea, vomiting, diarrhea, constipation, change in bowel habits, abdominal pain, melena, hematochezia GU: Denies penile discharge. Decreased erectile quality.  Sometimes he will lose his erection. Musculoskeletal: Denies back pain, joint pain Derm: Denies rash, itching Neuro: Denies  paresthesias, frequent falls, frequent headaches Psych: Denies depression, anxiety Endocrine: Denies cold intolerance, heat intolerance, polydipsia Heme: Denies enlarged lymph nodes Allergy: No hayfever  Objective:   BP 130/86   Pulse 72   Temp 98.3 F (36.8 C) (Oral)   Ht '5\' 7"'$  (1.702 m)   Wt 187 lb 4 oz (84.9 kg)   BMI 29.33 kg/m  Ideal Body Weight: Weight in (lb) to have BMI = 25: 159.3  No exam data present  GEN: well developed, well nourished, no acute distress Eyes: conjunctiva and lids normal, PERRLA, EOMI ENT: TM clear, nares clear, oral exam WNL Neck: supple, no lymphadenopathy, no thyromegaly, no JVD Pulm: clear to auscultation and percussion, respiratory effort normal CV: regular rate and rhythm, S1-S2, no murmur,  rub or gallop, no bruits, peripheral pulses normal and symmetric, no cyanosis, clubbing, edema or varicosities GI: soft, non-tender; no hepatosplenomegaly, masses; active bowel sounds all quadrants GU: no hernia, testicular mass, penile discharge Lymph: no cervical, axillary or inguinal  adenopathy MSK: gait normal, muscle tone and strength WNL, no joint swelling, effusions, discoloration, crepitus  SKIN: clear, good turgor, color WNL, no rashes, lesions, or ulcerations Neuro: normal mental status, normal strength, sensation, and motion Psych: alert; oriented to person, place and time, normally interactive and not anxious or depressed in appearance.  All labs reviewed with patient.  Lipids:    Component Value Date/Time   CHOL 133 10/12/2016 0918   TRIG 100.0 10/12/2016 0918   HDL 33.00 (L) 10/12/2016 0918   LDLDIRECT 103.0 06/18/2015 1603   VLDL 20.0 10/12/2016 0918   CHOLHDL 4 10/12/2016 0918   CBC: CBC Latest Ref Rng & Units 10/12/2016 06/18/2015 09/04/2013  WBC 4.0 - 10.5 K/uL 5.1 11.1(H) 6.0  Hemoglobin 13.0 - 17.0 g/dL 16.1 16.4 15.5  Hematocrit 39.0 - 52.0 % 47.0 49.4 45.9  Platelets 150.0 - 400.0 K/uL 186.0 220.0 710.6    Basic Metabolic Panel:    Component Value Date/Time   NA 138 10/13/2016 1159   K 4.4 10/13/2016 1159   CL 102 10/13/2016 1159   CO2 29 10/13/2016 1159   BUN 18 10/13/2016 1159   CREATININE 0.92 10/13/2016 1159   GLUCOSE 105 (H) 10/13/2016 1159   CALCIUM 9.3 10/13/2016 1159   Hepatic Function Latest Ref Rng & Units 10/14/2016 06/18/2015 09/04/2013  Total Protein 6.0 - 8.3 g/dL 7.3 7.9 7.1  Albumin 3.5 - 5.2 g/dL 4.4 4.4 4.1  AST 0 - 37 U/L '18 18 19  '$ ALT 0 - 53 U/L '20 21 19  '$ Alk Phosphatase 39 - 117 U/L 69 85 73  Total Bilirubin 0.2 - 1.2 mg/dL 0.3 0.4 0.6  Bilirubin, Direct 0.0 - 0.3 mg/dL 0.1 0.1 0.1    Lab Results  Component Value Date   TSH 2.20 06/18/2015   Lab Results  Component Value Date   PSA 1.51 10/12/2016   PSA 1.50 09/04/2013   PSA 1.41 04/09/2010    Assessment and Plan:   Healthcare maintenance  He asked questions regarding coronary calcium scoring, so did my best to explain.  He declines colonoscopy.  He is interested in cologuard, and he will check with insurance.  Health Maintenance Exam: The  patient's preventative maintenance and recommended screening tests for an annual wellness exam were reviewed in full today. Brought up to date unless services declined.  Counselled on the importance of diet, exercise, and its role in overall health and mortality. The patient's FH and SH was reviewed, including their home life, tobacco status, and drug and alcohol status.  Follow-up in 1 year for physical exam or additional follow-up below.  Follow-up: No Follow-up on file. Or follow-up in 1 year if not noted.  Meds ordered this encounter  Medications  . DISCONTD: sildenafil (REVATIO) 20 MG tablet    Sig: Generic Revatio / Sildanefil 20 mg. 2 - 5 tabs 30 mins prior to intercourse.    Dispense:  50 tablet    Refill:  11  . sildenafil (REVATIO) 20 MG tablet    Sig: Generic Revatio / Sildanefil 20 mg. 2 - 5 tabs 30 mins prior to intercourse.    Dispense:  50 tablet    Refill:  11   Medications Discontinued During This Encounter  Medication Reason  . predniSONE (DELTASONE)  20 MG tablet Completed Course  . methocarbamol (ROBAXIN) 750 MG tablet Completed Course  . meloxicam (MOBIC) 15 MG tablet Completed Course  . levofloxacin (LEVAQUIN) 500 MG tablet Completed Course  . sildenafil (REVATIO) 20 MG tablet Reorder   Signed,  Frederico Hamman T. Zarinah Oviatt, MD   Allergies as of 10/14/2016   No Known Allergies     Medication List       Accurate as of 10/14/16 11:59 PM. Always use your most recent med list.          lisinopril 10 MG tablet Commonly known as:  PRINIVIL,ZESTRIL TAKE 1 TABLET ONCE DAILY FOR BLOOD PRESSURE   sildenafil 20 MG tablet Commonly known as:  REVATIO Generic Revatio / Sildanefil 20 mg. 2 - 5 tabs 30 mins prior to intercourse.   simvastatin 20 MG tablet Commonly known as:  ZOCOR TAKE 1 TABLET BY MOUTH ONCE A DAY FOR CHOLESTEROL

## 2016-10-22 ENCOUNTER — Telehealth: Payer: Self-pay | Admitting: Family Medicine

## 2016-10-22 ENCOUNTER — Inpatient Hospital Stay (HOSPITAL_COMMUNITY)
Admission: AD | Disposition: A | Discharge status: Home / Self Care | Payer: Self-pay | Source: Ambulatory Visit | Attending: Interventional Cardiology

## 2016-10-22 ENCOUNTER — Encounter (HOSPITAL_COMMUNITY): Payer: Self-pay | Admitting: Cardiology

## 2016-10-22 ENCOUNTER — Inpatient Hospital Stay (HOSPITAL_COMMUNITY)
Admission: AD | Admit: 2016-10-22 | Discharge: 2016-10-23 | DRG: 247 | Disposition: A | Discharge status: Home / Self Care | Payer: Commercial Managed Care - PPO | Source: Ambulatory Visit | Attending: Interventional Cardiology | Admitting: Interventional Cardiology

## 2016-10-22 DIAGNOSIS — I2129 ST elevation (STEMI) myocardial infarction involving other sites: Secondary | ICD-10-CM | POA: Diagnosis present

## 2016-10-22 DIAGNOSIS — I2119 ST elevation (STEMI) myocardial infarction involving other coronary artery of inferior wall: Principal | ICD-10-CM | POA: Diagnosis present

## 2016-10-22 DIAGNOSIS — Z8249 Family history of ischemic heart disease and other diseases of the circulatory system: Secondary | ICD-10-CM

## 2016-10-22 DIAGNOSIS — I10 Essential (primary) hypertension: Secondary | ICD-10-CM | POA: Diagnosis present

## 2016-10-22 DIAGNOSIS — R7303 Prediabetes: Secondary | ICD-10-CM | POA: Diagnosis present

## 2016-10-22 DIAGNOSIS — R079 Chest pain, unspecified: Secondary | ICD-10-CM | POA: Diagnosis present

## 2016-10-22 DIAGNOSIS — I2510 Atherosclerotic heart disease of native coronary artery without angina pectoris: Secondary | ICD-10-CM | POA: Diagnosis not present

## 2016-10-22 DIAGNOSIS — I252 Old myocardial infarction: Secondary | ICD-10-CM

## 2016-10-22 DIAGNOSIS — E785 Hyperlipidemia, unspecified: Secondary | ICD-10-CM | POA: Diagnosis present

## 2016-10-22 DIAGNOSIS — Z79899 Other long term (current) drug therapy: Secondary | ICD-10-CM

## 2016-10-22 DIAGNOSIS — Z955 Presence of coronary angioplasty implant and graft: Secondary | ICD-10-CM

## 2016-10-22 HISTORY — DX: Atherosclerotic heart disease of native coronary artery without angina pectoris: I25.10

## 2016-10-22 HISTORY — PX: LEFT HEART CATH AND CORONARY ANGIOGRAPHY: CATH118249

## 2016-10-22 HISTORY — PX: CORONARY STENT INTERVENTION: CATH118234

## 2016-10-22 LAB — MRSA PCR SCREENING: MRSA by PCR: NEGATIVE

## 2016-10-22 SURGERY — LEFT HEART CATH AND CORONARY ANGIOGRAPHY
Anesthesia: LOCAL

## 2016-10-22 MED ORDER — TIROFIBAN HCL IN NACL 5-0.9 MG/100ML-% IV SOLN
INTRAVENOUS | Status: AC
  Filled 2016-10-22: qty 100

## 2016-10-22 MED ORDER — TIROFIBAN (AGGRASTAT) BOLUS VIA INFUSION
INTRAVENOUS | Status: DC | PRN
  Administered 2016-10-22: 2122.5 ug via INTRAVENOUS

## 2016-10-22 MED ORDER — TIROFIBAN HCL IN NACL 5-0.9 MG/100ML-% IV SOLN
0.1500 ug/kg/min | INTRAVENOUS | Status: AC
Start: 2016-10-22 — End: 2016-10-22

## 2016-10-22 MED ORDER — HYDRALAZINE HCL 20 MG/ML IJ SOLN: 5.0000 mg | INTRAMUSCULAR | Status: AC | PRN

## 2016-10-22 MED ORDER — ASPIRIN 81 MG PO CHEW
81.0000 mg | CHEWABLE_TABLET | Freq: Every day | ORAL | Status: DC
  Administered 2016-10-23: 81 mg via ORAL
  Filled 2016-10-22: qty 1

## 2016-10-22 MED ORDER — TICAGRELOR 90 MG PO TABS
90.0000 mg | ORAL_TABLET | Freq: Two times a day (BID) | ORAL | Status: DC
  Administered 2016-10-23: 90 mg via ORAL
  Filled 2016-10-22: qty 1

## 2016-10-22 MED ORDER — IOPAMIDOL (ISOVUE-370) INJECTION 76%
INTRAVENOUS | Status: DC | PRN
  Administered 2016-10-22: 95 mL via INTRAVENOUS

## 2016-10-22 MED ORDER — HEPARIN SODIUM (PORCINE) 1000 UNIT/ML IJ SOLN
INTRAMUSCULAR | Status: AC
  Filled 2016-10-22: qty 1

## 2016-10-22 MED ORDER — METOPROLOL TARTRATE 12.5 MG HALF TABLET
12.5000 mg | ORAL_TABLET | Freq: Two times a day (BID) | ORAL | Status: DC
  Administered 2016-10-22 – 2016-10-23 (×2): 12.5 mg via ORAL
  Filled 2016-10-22 (×2): qty 1

## 2016-10-22 MED ORDER — SODIUM CHLORIDE 0.9 % IV SOLN: 250.0000 mL | INTRAVENOUS | Status: DC | PRN

## 2016-10-22 MED ORDER — SODIUM CHLORIDE 0.9% FLUSH: 3.0000 mL | Freq: Two times a day (BID) | INTRAVENOUS | Status: DC

## 2016-10-22 MED ORDER — TIROFIBAN HCL IN NACL 5-0.9 MG/100ML-% IV SOLN
INTRAVENOUS | Status: DC | PRN
  Administered 2016-10-22: 0.15 ug/kg/min via INTRAVENOUS

## 2016-10-22 MED ORDER — ONDANSETRON HCL 4 MG/2ML IJ SOLN: 4.0000 mg | Freq: Four times a day (QID) | INTRAMUSCULAR | Status: DC | PRN

## 2016-10-22 MED ORDER — HEPARIN (PORCINE) IN NACL 2-0.9 UNIT/ML-% IJ SOLN
INTRAMUSCULAR | Status: DC | PRN
Start: 2016-10-22 — End: 2016-10-22
  Administered 2016-10-22: 1000 mL

## 2016-10-22 MED ORDER — FENTANYL CITRATE (PF) 100 MCG/2ML IJ SOLN
INTRAMUSCULAR | Status: AC
  Filled 2016-10-22: qty 2

## 2016-10-22 MED ORDER — HEPARIN SODIUM (PORCINE) 1000 UNIT/ML IJ SOLN
INTRAMUSCULAR | Status: DC | PRN
  Administered 2016-10-22 (×2): 4500 [IU] via INTRAVENOUS

## 2016-10-22 MED ORDER — SODIUM CHLORIDE 0.9 % IV SOLN: INTRAVENOUS | Status: AC

## 2016-10-22 MED ORDER — MIDAZOLAM HCL 2 MG/2ML IJ SOLN
INTRAMUSCULAR | Status: DC | PRN
  Administered 2016-10-22: 2 mg via INTRAVENOUS

## 2016-10-22 MED ORDER — ACETAMINOPHEN 325 MG PO TABS: 650.0000 mg | ORAL_TABLET | ORAL | Status: DC | PRN

## 2016-10-22 MED ORDER — HEPARIN (PORCINE) IN NACL 2-0.9 UNIT/ML-% IJ SOLN
INTRAMUSCULAR | Status: AC
  Filled 2016-10-22: qty 1000

## 2016-10-22 MED ORDER — NITROGLYCERIN 1 MG/10 ML FOR IR/CATH LAB
INTRA_ARTERIAL | Status: AC
  Filled 2016-10-22: qty 10

## 2016-10-22 MED ORDER — TICAGRELOR 90 MG PO TABS
ORAL_TABLET | ORAL | Status: DC | PRN
  Administered 2016-10-22: 180 mg via ORAL

## 2016-10-22 MED ORDER — FENTANYL CITRATE (PF) 100 MCG/2ML IJ SOLN
INTRAMUSCULAR | Status: DC | PRN
  Administered 2016-10-22: 25 ug via INTRAVENOUS

## 2016-10-22 MED ORDER — LABETALOL HCL 5 MG/ML IV SOLN: 10.0000 mg | INTRAVENOUS | Status: AC | PRN

## 2016-10-22 MED ORDER — LIDOCAINE HCL (PF) 1 % IJ SOLN
INTRAMUSCULAR | Status: DC | PRN
  Administered 2016-10-22: 2 mL

## 2016-10-22 MED ORDER — MIDAZOLAM HCL 2 MG/2ML IJ SOLN
INTRAMUSCULAR | Status: AC
  Filled 2016-10-22: qty 2

## 2016-10-22 MED ORDER — SODIUM CHLORIDE 0.9% FLUSH: 3.0000 mL | INTRAVENOUS | Status: DC | PRN

## 2016-10-22 MED ORDER — LIDOCAINE HCL (PF) 1 % IJ SOLN
INTRAMUSCULAR | Status: AC
  Filled 2016-10-22: qty 30

## 2016-10-22 MED ORDER — VERAPAMIL HCL 2.5 MG/ML IV SOLN
INTRAVENOUS | Status: AC
  Filled 2016-10-22: qty 2

## 2016-10-22 MED ORDER — ATORVASTATIN CALCIUM 80 MG PO TABS
80.0000 mg | ORAL_TABLET | Freq: Every day | ORAL | Status: DC
  Administered 2016-10-22: 80 mg via ORAL
  Filled 2016-10-22: qty 1

## 2016-10-22 MED ORDER — VERAPAMIL HCL 2.5 MG/ML IV SOLN
INTRAVENOUS | Status: DC | PRN
  Administered 2016-10-22: 10 mL via INTRA_ARTERIAL

## 2016-10-22 SURGICAL SUPPLY — 19 items
BALLN MOZEC 2.50X14 (BALLOONS) ×4
BALLN ~~LOC~~ EUPHORA RX 2.5X12 (BALLOONS) ×2
BALLOON MOZEC 2.50X14 (BALLOONS) ×2 IMPLANT
BALLOON ~~LOC~~ EUPHORA RX 2.5X12 (BALLOONS) ×1 IMPLANT
CATH 5FR JL3.5 JR4 ANG PIG MP (CATHETERS) ×2 IMPLANT
CATH LAUNCHER 6FR EBU 3 (CATHETERS) ×2 IMPLANT
DEVICE RAD COMP TR BAND LRG (VASCULAR PRODUCTS) ×2 IMPLANT
GLIDESHEATH SLEND SS 6F .021 (SHEATH) ×2 IMPLANT
GUIDEWIRE INQWIRE 1.5J.035X260 (WIRE) ×1 IMPLANT
INQWIRE 1.5J .035X260CM (WIRE) ×2
KIT ENCORE 26 ADVANTAGE (KITS) ×2 IMPLANT
KIT HEART LEFT (KITS) ×2 IMPLANT
PACK CARDIAC CATHETERIZATION (CUSTOM PROCEDURE TRAY) ×2 IMPLANT
STENT PROMUS PREM MR 2.25X16 (Permanent Stent) ×2 IMPLANT
SYR MEDRAD MARK V 150ML (SYRINGE) ×2 IMPLANT
TRANSDUCER W/STOPCOCK (MISCELLANEOUS) ×2 IMPLANT
TUBING CIL FLEX 10 FLL-RA (TUBING) ×2 IMPLANT
VALVE GUARDIAN II ~~LOC~~ HEMO (MISCELLANEOUS) ×2 IMPLANT
WIRE ASAHI PROWATER 180CM (WIRE) ×4 IMPLANT

## 2016-10-22 NOTE — Progress Notes (Addendum)
Raised area noted proximal to TR band. Pressure applied by Suella Broad. Angiomax off at 1520 per order Dr Irish Lack. Second more proximal TR band placed ( per order ) by Sherlyn Lick with 9cc at 1540. Proximal band slowly deflated and removed at 1640.  Will observe....  At 1700 area proximal to TR band remains puffy without change. Deflation of distal band started. At [place] remain in TR band, no change noted in proximal area. Will Tx to 4N12 when report is received.

## 2016-10-22 NOTE — Telephone Encounter (Signed)
Pt called - he is requesting a cardiologist referral, he would prefer [facility].  cb number is [phone] Thanks

## 2016-10-22 NOTE — Telephone Encounter (Signed)
Unable to reach pt at any contact #.FYI to Dr Lorelei Pont.

## 2016-10-22 NOTE — H&P (Addendum)
History & Physical    Patient ID: Steven Kemp MRN: [id number], DOB/AGE: [birth year]   Admit date: 10/22/2016   Primary Physician: Owens Loffler, MD Primary Cardiologist: Yong Channel)   Patient Profile    51 yo male with PMH of HTN, HL and borderline DM who presented with chest pain and called a STEMI by [facility] in the field.   Past Medical History   Past Medical History:  Diagnosis Date  . Hyperlipidemia   . Hypertension     No past surgical history on file.   Allergies  Not on File  History of Present Illness    51 yo male with PMH of  HTN, HL and borderline DM. He denies having seen a cardiologist in the past, usually followed by his PCP for chronic illness. Denies any family hx of CAD. Nonsmoker. Reports he was om his usual state of health until this morning. Developed left arm pain, then centralized chest pain. States he became very diaphoretic as well. Symptoms subsided and he was able to go to work. Chest pain lingered throughout the morning. While at work he became concerned and called his PCP office for a referral to a cardiologist. He was instructed to call 911. On EMS arrival EKG showed acute changes in the inferior lateral leads. Code STEMI was called. He was given 324 asa and 1 SL nitro with resolution of pain. He was brought to the cath lab emergently for cardiac catheterization.   Home Medications    Prior to Admission medications   Medication Sig Start Date End Date Taking? Authorizing Provider  lisinopril (PRINIVIL,ZESTRIL) 10 MG tablet TAKE 1 TABLET ONCE DAILY FOR BLOOD PRESSURE 10/13/16   Owens Loffler, MD  sildenafil (REVATIO) 20 MG tablet Generic Revatio / Sildanefil 20 mg. 2 - 5 tabs 30 mins prior to intercourse. 10/14/16   Owens Loffler, MD  simvastatin (ZOCOR) 20 MG tablet TAKE 1 TABLET BY MOUTH ONCE A DAY FOR CHOLESTEROL 10/13/16   Owens Loffler, MD    Family History    Family History  Problem Relation Age of Onset  . Hypertension  Father     Social History    Social History   Social History  . Marital status: Married    Spouse name: N/A  . Number of children: 2  . Years of education: N/A   Occupational History  . [employer]    Data base management   Social History Main Topics  . Smoking status: Never Smoker  . Smokeless tobacco: Never Used  . Alcohol use Yes     Comment: very rare  . Drug use: No  . Sexual activity: Not on file   Other Topics Concern  . Not on file   Social History Narrative  . No narrative on file     Review of Systems    General:  No chills, fever, night sweats or weight changes.  Cardiovascular:  See HPI Dermatological: No rash, lesions/masses Respiratory: No cough, dyspnea Urologic: No hematuria, dysuria Abdominal:   No nausea, vomiting, diarrhea, bright red blood per rectum, melena, or hematemesis Neurologic:  No visual changes, wkns, changes in mental status. All other systems reviewed and are otherwise negative except as noted above.  Physical Exam    Blood pressure (!) 122/54, pulse 78, resp. rate 15, SpO2 99 %.  General: Pleasant, NAD Psych: Normal affect. Neuro: Alert and oriented X 3. Moves all extremities spontaneously. HEENT: Normal  Neck: Supple without bruits or JVD. Lungs:  Resp regular and unlabored, CTA. Heart: RRR no s3, s4, or murmurs. Abdomen: Soft, non-tender, non-distended, BS + x 4.  Extremities: No clubbing, cyanosis or edema. DP/PT/Radials 2+ and equal bilaterally.  Labs    Troponin (Point of Care Test) No results for input(s): TROPIPOC in the last 72 hours. No results for input(s): CKTOTAL, CKMB, TROPONINI in the last 72 hours. Lab Results  Component Value Date   WBC 5.1 10/12/2016   HGB 16.1 10/12/2016   HCT 47.0 10/12/2016   MCV 84.9 10/12/2016   PLT 186.0 10/12/2016   No results for input(s): NA, K, CL, CO2, BUN, CREATININE, CALCIUM, PROT, BILITOT, ALKPHOS, ALT, AST, GLUCOSE in the last 168  hours.  Invalid input(s): LABALBU Lab Results  Component Value Date   CHOL 133 10/12/2016   HDL 33.00 (L) 10/12/2016   LDLCALC 80 10/12/2016   TRIG 100.0 10/12/2016   No results found for: [facility]   Radiology Studies    No results found.  ECG & Cardiac Imaging    EKG: SR with acute ST depression in inferior leads, slight elevation in aVL  Assessment & Plan    51 yo male with PMH of HTN, HL and borderline DM who presented with chest pain and called a STEMI by [facility] in the field.  STEMI: Pain started this morning around 6:30am and lingered throughout the morning. EKG with acute changes in inferolateral leads. Given 324 ASA and 1SL nitro PTA. He was taken emergently to the cath lab. Recommendations to follow post cath.   Barnet Pall, NP-C Pager [phone] 10/22/2016, 4:39 PM   I have examined the patient and reviewed assessment and plan and discussed with patient.  Agree with above as stated. I personally Reviewed the ECG and made the decision for him to come to the Cath Lab. There are subtle ST elevations in 1 and aVL with reciprocal ST depressions in inferior lead. His pain had resolved by the time he came to the Cath Lab after one nitroglycerin. I still think emergent angiography is reasonable.  Angiography revealed a severely diseased first diagonal which corresponded to the EKG changes and wall motion abnormality. This vessel was likely occluded when he had a severe symptoms and recanalized. This was successfully stented.  Steven Kemp

## 2016-10-22 NOTE — Telephone Encounter (Signed)
Patient called to find out about referral.  Patient said he's having chest pain, left arm numbness, and sweating.  I sent call to [facility].

## 2016-10-22 NOTE — Telephone Encounter (Signed)
I need to know if anything is going on or at least a reason for the consult. I saw him about a week ago and he was completely asymptomatic.

## 2016-10-22 NOTE — Telephone Encounter (Signed)
Patient Name: Steven Kemp DOB: Mar 01, 1966 Initial Comment Caller states he has pain in his upper chest and pain in his forearm. Nurse Assessment Nurse: Ronnald Ramp, RN, Miranda Date/Time (Eastern Time): 10/22/2016 76:15:18 PM Confirm and document reason for call. If symptomatic, describe symptoms. ---Caller states he has been having pain in his chest and in his left forearm since this morning. Does the patient have any new or worsening symptoms? ---Yes Will a triage be completed? ---Yes Related visit to physician within the last 2 weeks? ---No Does the PT have any chronic conditions? (i.e. diabetes, asthma, etc.) ---Yes List chronic conditions. ---HTN, High Cholesterol Is this a behavioral health or substance abuse call? ---No Guidelines Guideline Title Affirmed Question Affirmed Notes Chest Pain [1] Chest pain lasts > 5 minutes AND [2] age > 67 Final Disposition User Call EMS 911 Now Ronnald Ramp, RN, Miranda Referrals GO TO [facility] UNDECIDED Disagree/Comply: Disagree Disagree/Comply Reason: Disagree with instructions

## 2016-10-22 NOTE — Telephone Encounter (Signed)
Completely appropriate for him to go to hospital as directed.

## 2016-10-22 NOTE — Telephone Encounter (Signed)
Per chart review tab pt went to [facility]. 

## 2016-10-23 ENCOUNTER — Encounter (HOSPITAL_COMMUNITY): Payer: Self-pay | Admitting: Interventional Cardiology

## 2016-10-23 ENCOUNTER — Telehealth: Payer: Self-pay | Admitting: *Deleted

## 2016-10-23 LAB — BASIC METABOLIC PANEL
ANION GAP: 7 (ref 5–15)
BUN: 13 mg/dL (ref 6–20)
CALCIUM: 8.7 mg/dL — AB (ref 8.9–10.3)
CO2: 26 mmol/L (ref 22–32)
CREATININE: 0.93 mg/dL (ref 0.61–1.24)
Chloride: 101 mmol/L (ref 101–111)
GFR calc Af Amer: >60 mL/min (ref >60)
GLUCOSE: 112 mg/dL — AB (ref 65–99)
Potassium: 4.1 mmol/L (ref 3.5–5.1)
Sodium: 134 mmol/L — ABNORMAL LOW (ref 135–145)

## 2016-10-23 LAB — POCT I-STAT, CHEM 8
BUN: 16 mg/dL (ref 6–20)
CHLORIDE: 101 mmol/L (ref 101–111)
CREATININE: 0.8 mg/dL (ref 0.61–1.24)
Calcium, Ion: 1.19 mmol/L (ref 1.15–1.40)
GLUCOSE: 123 mg/dL — AB (ref 65–99)
HCT: 47 % (ref 39.0–52.0)
Hemoglobin: 16 g/dL (ref 13.0–17.0)
POTASSIUM: 3.9 mmol/L (ref 3.5–5.1)
SODIUM: 138 mmol/L (ref 135–145)
TCO2: 25 mmol/L (ref 0–100)

## 2016-10-23 LAB — CBC
HCT: 44.9 % (ref 39.0–52.0)
Hemoglobin: 15.2 g/dL (ref 13.0–17.0)
MCH: 28.5 pg (ref 26.0–34.0)
MCHC: 33.9 g/dL (ref 30.0–36.0)
MCV: 84.2 fL (ref 78.0–100.0)
PLATELETS: 159 10*3/uL (ref 150–400)
RBC: 5.33 MIL/uL (ref 4.22–5.81)
RDW: 12.4 % (ref 11.5–15.5)
WBC: 10 10*3/uL (ref 4.0–10.5)

## 2016-10-23 LAB — POCT ACTIVATED CLOTTING TIME: ACTIVATED CLOTTING TIME: 444 s

## 2016-10-23 MED ORDER — ATORVASTATIN CALCIUM 80 MG PO TABS: 80.0000 mg | ORAL_TABLET | Freq: Every day | ORAL | 6 refills | Status: DC

## 2016-10-23 MED ORDER — LISINOPRIL 10 MG PO TABS
10.0000 mg | ORAL_TABLET | Freq: Every day | ORAL | Status: DC
  Administered 2016-10-23: 10 mg via ORAL
  Filled 2016-10-23: qty 1

## 2016-10-23 MED ORDER — ASPIRIN 81 MG PO CHEW: 81.0000 mg | CHEWABLE_TABLET | Freq: Every day | ORAL | Status: DC

## 2016-10-23 MED ORDER — TICAGRELOR 90 MG PO TABS: 90.0000 mg | ORAL_TABLET | Freq: Two times a day (BID) | ORAL | 10 refills | Status: DC

## 2016-10-23 MED ORDER — METOPROLOL TARTRATE 25 MG PO TABS: 12.5000 mg | ORAL_TABLET | Freq: Two times a day (BID) | ORAL | 3 refills | Status: DC

## 2016-10-23 MED ORDER — NITROGLYCERIN 0.4 MG SL SUBL: 0.4000 mg | SUBLINGUAL_TABLET | SUBLINGUAL | 3 refills | Status: DC | PRN

## 2016-10-23 MED ORDER — TICAGRELOR 90 MG PO TABS: 90.0000 mg | ORAL_TABLET | Freq: Two times a day (BID) | ORAL | 0 refills | Status: DC

## 2016-10-23 MED FILL — Nitroglycerin IV Soln 100 MCG/ML in D5W: INTRA_ARTERIAL | Qty: 10 | Status: AC

## 2016-10-23 NOTE — Progress Notes (Signed)
CARDIAC REHAB PHASE I   PRE:  Rate/Rhythm: 92 SR  BP:  Supine: 121/85  Sitting:   Standing:    SaO2: 100%RA  MODE:  Ambulation: 500 ft   POST:  Rate/Rhythm: 104 ST  BP:  Supine: 152/94  Sitting:   Standing:    SaO2: 99%RA [id number] Very nice man. Walked 500 ft on RA with steady gait and no CP. Tolerated well. MI education completed with pt who voiced understanding. Stressed importance of brilinta with stent. Pt stated he has [facility]. Case manager to see pt. Reviewed NTG use, watching carbs and heart healthy diet, ex ed and MI restrictions. Discussed CRP 2 and will refer to [place] program.   Graylon Good, RN BSN  10/23/2016 10:15 AM

## 2016-10-23 NOTE — Care Management Note (Addendum)
Case Management Note  Patient Details  Name: Steven Kemp MRN: [id number] Date of Birth: [birth year]  Subjective/Objective:                    Action/Plan: Pt discharging home with self care. CM consulted for home Brilinta. CM provided the patient the 30 day free card and the monthly assistance card. Pt states he has [employer] for insurance under his wife.  CM called [employer] and they show that his coverage started and stopped on the same day. Wife is trying to talk with [facility] and get their health coverage straight.  [facility] [place] [facility]. [facility] and they show he has [employer] for medicine coverage and was able to get prescriptions filled in March without any issues. CM provided him with coupons for the other new medicines he was prescribed in case he has further issues at the pharmacy. Pt understanding of the issues with insurance.  CM did resend a copy of patients insurance card to financial counseling.  Wife to provide transportation home.   Expected Discharge Date:  10/23/16               Expected Discharge Plan:  Home/Self Care  In-House Referral:     Discharge planning Services     Post Acute Care Choice:    Choice offered to:     DME Arranged:    DME Agency:     HH Arranged:    HH Agency:     Status of Service:  Completed, signed off  If discussed at [employer] of Stay Meetings, dates discussed:    Additional Comments:  Pollie Friar, RN 10/23/2016, 11:14 AM

## 2016-10-23 NOTE — Progress Notes (Signed)
Progress Note  Patient Name: Steven Kemp Date of Encounter: 10/23/2016  Primary Cardiologist: Irish Lack  Subjective   No chest pain or dyspnea  Inpatient Medications    Scheduled Meds: . aspirin  81 mg Oral Daily  . atorvastatin  80 mg Oral q1800  . metoprolol tartrate  12.5 mg Oral BID  . sodium chloride flush  3 mL Intravenous Q12H  . ticagrelor  90 mg Oral Q12H   Continuous Infusions: . sodium chloride     PRN Meds: sodium chloride, acetaminophen, ondansetron (ZOFRAN) IV, sodium chloride flush   Vital Signs    Vitals:   10/22/16 2010 10/22/16 2300 10/23/16 0300 10/23/16 0811  BP: (!) 110/93 126/89 122/85 133/88  Pulse: 75 66 79 64  Resp: (!) 22 16 18 17   Temp: 97.5 F (36.4 C) 98.5 F (36.9 C) 98.4 F (36.9 C) 97.5 F (36.4 C)  TempSrc: Oral Oral Oral Oral  SpO2: 99% 99% 100% 100%   No intake or output data in the 24 hours ending 10/23/16 0845 There were no vitals filed for this visit.  Telemetry    sinus - Personally Reviewed  ECG    Sinus, septal infarct - Personally Reviewed  Physical Exam   GEN: No acute distress.   Neck: No JVD Cardiac: RRR, no murmurs, rubs, or gallops.  Respiratory: Clear to auscultation bilaterally. GI: Soft, nontender, non-distended  MS: No edema; No deformity. Neuro:  Nonfocal  Psych: Normal affect   Labs    Chemistry Recent Labs Lab 10/23/16 0250  NA 134*  K 4.1  CL 101  CO2 26  GLUCOSE 112*  BUN 13  CREATININE 0.93  CALCIUM 8.7*  GFRNONAA >60  GFRAA >60  ANIONGAP 7     Hematology Recent Labs Lab 10/23/16 0250  WBC 10.0  RBC 5.33  HGB 15.2  HCT 44.9  MCV 84.2  MCH 28.5  MCHC 33.9  RDW 12.4  PLT 159    Cardiac EnzymesNo results for input(s): TROPONINI in the last 168 hours. No results for input(s): TROPIPOC in the last 168 hours.    Radiology    No results found.  Cardiac Studies   Cardiac cath 10/22/16  Ost RPDA to RPDA lesion, 80 %stenosed. Bifurcation of PDA /PLA occurs  more proximally.  Prox Cx lesion, 50 %stenosed.  Prox LAD to Mid LAD lesion, 25 %stenosed.  Dist LAD lesion, 90 %stenosed. This is at the apex and not amenable to PCI.  1st Diag lesion, 95 %stenosed. A STENT PROMUS PREM MR 2.25X16 drug eluting stent was successfully placed, postdilated to > 2.5 mm.  Post intervention, there is a 0% residual stenosis.  The left ventricular systolic function is normal.  LV end diastolic pressure is normal.  The left ventricular ejection fraction is 50-55% by visual estimate.  There is no aortic valve stenosis.   Continue IV tirofiban for 2 hours.  He will need dual antiplatelet therapy for at least 1 year and aggressive risk factor modification.    Patient Profile     51 y.o. male with history of HTN, HLD, DM admitted with inferolateral STEMI. Emergent cardiac cath 10/22/16 per Dr. Irish Lack with high grade lesion in the Diagonal branch with TIMI-3 flow. This was treated with a drug eluting stent.   Assessment & Plan    1. CAD/Inferolateral STEMI: Cardiac cath with high grade stenosis Diagonal branch and moderate disease in the LAD, right PDA and distal LAD. The Diagonal was treated with a drug eluting stent x  1. Plans for medical management of other disease. He is doing well this am. Plan one year of DAPT with ASA and Brilinta. Continue high dose statin (change to Lipitor 80 mg daily) and beta blocker. Restart home Lisinopril. LV function overall preserved on LV gram.   Will d/c home today. Follow up in 1 week with office APP then [place]. He will need work note to stay out of work until he is seen in follow up. His wife will need a note stating that she was here yesterday and today with him.   Signed, Lauree Chandler, MD  10/23/2016, 8:45 AM

## 2016-10-23 NOTE — Telephone Encounter (Deleted)
[facility] Triage        Pt will need a TOC call, dc'd today post STEMI.   Thanks  [employer]

## 2016-10-23 NOTE — Progress Notes (Signed)
Pt given discharge instructions with understanding. Wife at bedside. Carroll Kinds RN

## 2016-10-23 NOTE — Discharge Summary (Signed)
Discharge Summary    Patient ID: Steven Kemp,  MRN: [id number], DOB/AGE: 51-Feb-1967 51 y.o.  Admit date: 10/22/2016 Discharge date: 10/23/2016  Primary Care Provider: Frederico Hamman Copland Primary Cardiologist: Irish Lack  Discharge Diagnoses    Active Problems:   Acute MI, lateral wall (HCC)   Acute MI, lateral wall, initial episode of care [facility])   Hyperlipidemia LDL goal <100   Essential hypertension  Allergies No Known Allergies  Diagnostic Studies/Procedures    LHC: 10/22/16  Conclusion     Ost RPDA to RPDA lesion, 80 %stenosed. Bifurcation of PDA /PLA occurs more proximally.  Prox Cx lesion, 50 %stenosed.  Prox LAD to Mid LAD lesion, 25 %stenosed.  Dist LAD lesion, 90 %stenosed. This is at the apex and not amenable to PCI.  1st Diag lesion, 95 %stenosed. A STENT PROMUS PREM MR 2.25X16 drug eluting stent was successfully placed, postdilated to > 2.5 mm.  Post intervention, there is a 0% residual stenosis.  The left ventricular systolic function is normal.  LV end diastolic pressure is normal.  The left ventricular ejection fraction is 50-55% by visual estimate.  There is no aortic valve stenosis.   Continue IV tirofiban for 2 hours.  He will need dual antiplatelet therapy for at least 1 year and aggressive risk factor modification.    Could consider discharge tomorrow depending on his course.  He had TIMI 3 flow upon initial angiography.    Increase potency of statin given ACS.    _____________   History of Present Illness     51 yo male with PMH of  HTN, HL and borderline DM. He denies having seen a cardiologist in the past, usually followed by his PCP for chronic illness. Denies any family hx of CAD. Nonsmoker. Reports he was om his usual state of health until this morning. Developed left arm pain, then centralized chest pain. States he became very diaphoretic as well. Symptoms subsided and he was able to go to work. Chest pain lingered throughout  the morning. While at work he became concerned and called his PCP office for a referral to a cardiologist. He was instructed to call [phone]. On EMS arrival EKG showed acute changes in the inferior lateral leads. Code STEMI was called. He was given 324 asa and 1 SL nitro with resolution of pain. He was brought to the cath lab emergently for cardiac catheterization.   Hospital Course    Underwent cardiac catheterization with Dr.Varanasi noted above with DES x1 to 1st Diag with residual moderate disease in the LAD, right PDA and distal LAD. LV gram showed preserved LV function. He was continued on IV tirofiban for 2 hours post cath. Planned for DAPT with ASA/Brilinta for at least one year. His statin was increased to Lipitor 80mg  and started on metoprolol 12.5mg  BID. His home lisinopril was restarted prior to discharge.   He was seen the following day by Dr. Angelena Form and determined stable for discharge. No complications noted overnight. He was given a work note and will be out until his follow up visit. Follow up in the office was arranged. Medications are listed below.  _____________  Discharge Vitals Blood pressure 133/88, pulse 64, temperature 97.5 F (36.4 C), temperature source Oral, resp. rate 17, SpO2 100 %.  There were no vitals filed for this visit.  Labs & Radiologic Studies    CBC  Recent Labs  10/22/16 1354 10/23/16 0250  WBC  --  10.0  HGB 16.0 15.2  HCT 47.0  44.9  MCV  --  84.2  PLT  --  882   Basic Metabolic Panel  Recent Labs  10/22/16 1354 10/23/16 0250  NA 138 134*  K 3.9 4.1  CL 101 101  CO2  --  26  GLUCOSE 123* 112*  BUN 16 13  CREATININE 0.80 0.93  CALCIUM  --  8.7*   Liver Function Tests No results for input(s): AST, ALT, ALKPHOS, BILITOT, PROT, ALBUMIN in the last 72 hours. No results for input(s): LIPASE, AMYLASE in the last 72 hours. Cardiac Enzymes No results for input(s): CKTOTAL, CKMB, CKMBINDEX, TROPONINI in the last 72 hours. BNP Invalid  input(s): POCBNP D-Dimer No results for input(s): DDIMER in the last 72 hours. Hemoglobin A1C No results for input(s): HGBA1C in the last 72 hours. Fasting Lipid Panel No results for input(s): CHOL, HDL, LDLCALC, TRIG, CHOLHDL, LDLDIRECT in the last 72 hours. Thyroid Function Tests No results for input(s): TSH, T4TOTAL, T3FREE, THYROIDAB in the last 72 hours.  Invalid input(s): FREET3 _____________  No results found. Disposition   Pt is being discharged home today in good condition.  Follow-up Plans & Appointments    Follow-up Information    Lyda Jester, PA-C Follow up on 11/02/2016.   Specialties:  Cardiology, Radiology Why:  at 9:30am for your follow up appt.  Contact information: [place] [id number] [phone]          Discharge Instructions    Amb Referral to Cardiac Rehabilitation    Complete by:  As directed    Diagnosis:   STEMI Coronary Stents     Diet - low sodium heart healthy    Complete by:  As directed    Discharge instructions    Complete by:  As directed    Radial Site Care Refer to this sheet in the next few weeks. These instructions provide you with information on caring for yourself after your procedure. Your caregiver may also give you more specific instructions. Your treatment has been planned according to current medical practices, but problems sometimes occur. Call your caregiver if you have any problems or questions after your procedure. HOME CARE INSTRUCTIONS You may shower the day after the procedure.Remove the bandage (dressing) and gently wash the site with plain soap and water.Gently pat the site dry.  Do not apply powder or lotion to the site.  Do not submerge the affected site in water for 3 to 5 days.  Inspect the site at least twice daily.  Do not flex or bend the affected arm for 24 hours.  No lifting over 5 pounds (2.3 kg) for 5 days after your procedure.  Do not drive home if you are discharged  the same day of the procedure. Have someone else drive you.  You may drive 24 hours after the procedure unless otherwise instructed by your caregiver.  What to expect: Any bruising will usually fade within 1 to 2 weeks.  Blood that collects in the tissue (hematoma) may be painful to the touch. It should usually decrease in size and tenderness within 1 to 2 weeks.  SEEK IMMEDIATE MEDICAL CARE IF: You have unusual pain at the radial site.  You have redness, warmth, swelling, or pain at the radial site.  You have drainage (other than a small amount of blood on the dressing).  You have chills.  You have a fever or persistent symptoms for more than 72 hours.  You have a fever and your symptoms suddenly get  worse.  Your arm becomes pale, cool, tingly, or numb.  You have heavy bleeding from the site. Hold pressure on the site.   Increase activity slowly    Complete by:  As directed       Discharge Medications   Current Discharge Medication List    START taking these medications   Details  aspirin 81 MG chewable tablet Chew 1 tablet (81 mg total) by mouth daily.    atorvastatin (LIPITOR) 80 MG tablet Take 1 tablet (80 mg total) by mouth daily at 6 PM. Qty: 30 tablet, Refills: 6    metoprolol tartrate (LOPRESSOR) 25 MG tablet Take 0.5 tablets (12.5 mg total) by mouth 2 (two) times daily. Qty: 60 tablet, Refills: 3    nitroGLYCERIN (NITROSTAT) 0.4 MG SL tablet Place 1 tablet (0.4 mg total) under the tongue every 5 (five) minutes as needed. Qty: 25 tablet, Refills: 3    ticagrelor (BRILINTA) 90 MG TABS tablet Take 1 tablet (90 mg total) by mouth every 12 (twelve) hours. Qty: 60 tablet, Refills: 10      CONTINUE these medications which have NOT CHANGED   Details  lisinopril (PRINIVIL,ZESTRIL) 10 MG tablet TAKE 1 TABLET ONCE DAILY FOR BLOOD PRESSURE Qty: 90 tablet, Refills: 0      STOP taking these medications     sildenafil (REVATIO) 20 MG tablet      simvastatin (ZOCOR) 20 MG  tablet          Aspirin prescribed at discharge?  Yes High Intensity Statin Prescribed? (Lipitor 40-80mg  or Crestor 20-40mg ): Yes Beta Blocker Prescribed? Yes For EF <40%, was ACEI/ARB Prescribed? Yes ADP Receptor Inhibitor Prescribed? (i.e. Plavix etc.-Includes Medically Managed Patients): Yes For EF <40%, Aldosterone Inhibitor Prescribed? No: EF ok Was EF assessed during THIS hospitalization? Yes Was Cardiac Rehab II ordered? (Included Medically managed Patients): Yes   Outstanding Labs/Studies   FLP/LFTs in 6 weeks if able to tolerate statin increase.   Duration of Discharge Encounter   Greater than 30 minutes including physician time.  Signed, Reino Bellis NP-C 10/23/2016, 10:52 AM

## 2016-10-23 NOTE — Telephone Encounter (Signed)
-----   Message from Candis Schatz sent at 10/23/2016 10:14 AM EDT ----- Regarding: TOC call needed Pt will need a TOC call, dc'd today post STEMI.  Thanks [employer]

## 2016-10-26 NOTE — Telephone Encounter (Signed)
Patient contacted regarding discharge from [facility] on 10/23/2016.  Patient understands to follow up with provider Lyda Jester on 11/02/16 at 9:30 AM at [facility]. Patient understands discharge instructions? Yes. Patient understands medications and regiment? Yes. Patient understands to bring all medications to this visit? Yes.

## 2016-10-29 ENCOUNTER — Encounter: Payer: Self-pay | Admitting: Cardiology

## 2016-11-02 ENCOUNTER — Encounter: Payer: Self-pay | Admitting: Cardiology

## 2016-11-02 ENCOUNTER — Encounter: Payer: Self-pay | Admitting: *Deleted

## 2016-11-02 ENCOUNTER — Ambulatory Visit (INDEPENDENT_AMBULATORY_CARE_PROVIDER_SITE_OTHER): Payer: Commercial Managed Care - PPO | Admitting: Cardiology

## 2016-11-02 VITALS — BP 121/77 | HR 64 | Resp 16 | Ht 68.0 in | Wt 180.0 lb

## 2016-11-02 DIAGNOSIS — I2510 Atherosclerotic heart disease of native coronary artery without angina pectoris: Secondary | ICD-10-CM

## 2016-11-02 DIAGNOSIS — E785 Hyperlipidemia, unspecified: Secondary | ICD-10-CM

## 2016-11-02 DIAGNOSIS — Z9861 Coronary angioplasty status: Secondary | ICD-10-CM

## 2016-11-02 NOTE — Patient Instructions (Addendum)
Medication Instructions:   Your physician recommends that you continue on your current medications as directed. Please refer to the Current Medication list given to you today.    If you need a refill on your cardiac medications before your next appointment, please call your pharmacy  LABS:  RETURN FOR FASTING LABS LIPIDS AND LFT AND 6 WEEKS   Testing/Procedures: NONE ORDERED  TODAY    Follow-Up:  IN 6 TO 8 WEEKS WITH DR Irish Lack.Marland KitchenMarland Kitchen   Any Other Special Instructions Will Be Listed Below (If Applicable).   YOU HAVE BEEN REFERRED TO CARDIAC REHAB AND WILL BE CONTACTED WITH FURTHER STEPS

## 2016-11-02 NOTE — Progress Notes (Signed)
11/02/2016 Steven Kemp   [birth year]  [id number]  Primary Physician Owens Loffler, MD Primary Cardiologist: Dr. Irish Lack   Reason for Visit/CC: [facility] F/u for CAD s/p STEMI  HPI:  Steven Kemp is a 51 y.o. male who is being seen today for evaluation for post hospital f/u after recent admission for myocardial infarction.   He is a 51 y/o male with h/o HTN, HLD and borderline DM, who presented to [facility] on 10/22/16 with CC of substernal chest and left arm pain with diaphoresis. He was brought in by EMS. On EMS arrival, EKG showed acute changes in the inferior lateral leads. Code STEMI was called. He was given 324 mg of ASA and 1 SL nitro with resolution of pain. He was brought to the cath lab emergently for cardiac catheterization.   Emergent LHC was performed by Dr. Irish Lack and he was found to have an occluded 1st diagonal vessel. S/p DES x1 to 1st Diag with residual moderate disease in the LAD, right PDA and distal LAD. LV gram showed preserved LV function. He was continued on IV tirofiban for 2 hours post cath. Plan is for DAPT with ASA/Brilinta for at least one year. His statin was increased to Lipitor 80mg  and he started on metoprolol 12.5mg  BID. His home lisinopril was restarted prior to discharge.   Of note, recent lipid panel at PCP office 10/12/16 showed LDL at 80 mg/dL. HgbA1c was WNL at 6.3.   He presents to clinic today for post hospital f/u. He has done well from a cardiac standpoint. He denies any recurrent chest or left arm pain. No dyspnea. He denies any exertional symptoms. He reports full medication compliance. He is tolerating aspirin and Brilinta well without any  abnormal bleeding. No medication-related dyspnea from the Brilinta. He reports full compliance with his Lipitor. No intolerances. He did have mild bruising of his forearm from his radial cath, however this is improved. He denies any significant pain, numbness or tingling. He has a 2+ radial pulse on  exam. He has not yet returned to work. He works for the [employer].     Current Meds  Medication Sig  . aspirin 81 MG chewable tablet Chew 1 tablet (81 mg total) by mouth daily.  Marland Kitchen atorvastatin (LIPITOR) 80 MG tablet Take 1 tablet (80 mg total) by mouth daily at 6 PM.  . lisinopril (PRINIVIL,ZESTRIL) 10 MG tablet TAKE 1 TABLET ONCE DAILY FOR BLOOD PRESSURE  . metoprolol tartrate (LOPRESSOR) 25 MG tablet Take 0.5 tablets (12.5 mg total) by mouth 2 (two) times daily.  . nitroGLYCERIN (NITROSTAT) 0.4 MG SL tablet Place 1 tablet (0.4 mg total) under the tongue every 5 (five) minutes as needed.  . ticagrelor (BRILINTA) 90 MG TABS tablet Take 1 tablet (90 mg total) by mouth every 12 (twelve) hours.   No Known Allergies Past Medical History:  Diagnosis Date  . CAD (coronary artery disease), native coronary artery    10/22/16 PCI with DES--> 1st diag  . Hyperlipidemia   . Hypertension    Family History  Problem Relation Age of Onset  . Hypertension Father    Past Surgical History:  Procedure Laterality Date  . CORONARY STENT INTERVENTION N/A 10/22/2016   Procedure: Coronary Stent Intervention;  Surgeon: Jettie Booze, MD;  Location: [place] [facility];  Service: Cardiovascular;  Laterality: N/A;  . LEFT HEART CATH AND CORONARY ANGIOGRAPHY N/A 10/22/2016   Procedure: Left Heart Cath and Coronary Angiography;  Surgeon: Jettie Booze, MD;  Location:  [place] IN[facility];  Service: Cardiovascular;  Laterality: N/A;   Social History   Social History  . Marital status: Married    Spouse name: N/A  . Number of children: 2  . Years of education: N/A   Occupational History  . [employer]    Data base management   Social History Main Topics  . Smoking status: Never Smoker  . Smokeless tobacco: Never Used  . Alcohol use Yes     Comment: very rare  . Drug use: No  . Sexual activity: Not on file   Other Topics Concern  . Not on file   Social History Narrative  .  No narrative on file     Review of Systems: General: negative for chills, fever, night sweats or weight changes.  Cardiovascular: negative for chest pain, dyspnea on exertion, edema, orthopnea, palpitations, paroxysmal nocturnal dyspnea or shortness of breath Dermatological: negative for rash Respiratory: negative for cough or wheezing Urologic: negative for hematuria Abdominal: negative for nausea, vomiting, diarrhea, bright red blood per rectum, melena, or hematemesis Neurologic: negative for visual changes, syncope, or dizziness All other systems reviewed and are otherwise negative except as noted above.   Physical Exam:  Blood pressure 121/77, pulse 64, resp. rate 16, height 5\' 8"  (1.727 m), weight 180 lb (81.6 kg), SpO2 98 %.  General appearance: alert, cooperative and no distress Neck: no carotid bruit and no JVD Lungs: clear to auscultation bilaterally Heart: regular rate and rhythm, S1, S2 normal, no murmur, click, rub or gallop Extremities: extremities normal, atraumatic, no cyanosis or edema Pulses: 2+ and symmetric Skin: Skin color, texture, turgor normal. No rashes or lesions Neurologic: Grossly normal  EKG not performed -- personally reviewed   LHC 10/22/16 Procedures   Coronary Stent Intervention  Left Heart Cath and Coronary Angiography  Conclusion     Ost RPDA to RPDA lesion, 80 %stenosed. Bifurcation of PDA /PLA occurs more proximally.  Prox Cx lesion, 50 %stenosed.  Prox LAD to Mid LAD lesion, 25 %stenosed.  Dist LAD lesion, 90 %stenosed. This is at the apex and not amenable to PCI.  1st Diag lesion, 95 %stenosed. A STENT PROMUS PREM MR 2.25X16 drug eluting stent was successfully placed, postdilated to > 2.5 mm.  Post intervention, there is a 0% residual stenosis.  The left ventricular systolic function is normal.  LV end diastolic pressure is normal.  The left ventricular ejection fraction is 50-55% by visual estimate.  There is no aortic valve  stenosis.    ASSESSMENT AND PLAN:   1. CAD: S/p recent STEMI 10/22/16 secondary to an occluded 1st diagonal vessel. S/p DES x1 to 1st Diag with residual moderate disease in the LAD, right PDA and distal LAD (see angiographic details above). LV gram showed preserved LV function. Stable w/o recurrent angina. No exertional symptoms. Continue medical therapy. DAPT with ASA + Brilinta x a minimum of 12 months, high intensity statin + BB. He has SL NTG at home for PRN use but has not required any since discharge. We discussed proper use of NTG and when to call 911 if recurrent symptoms that fail to improve after the 2nd dose. We will refer to phase 2 outpatient cardiac rehab.   2. HLD: recent lipid panel showed LDL at 80 mg/dL. Goal LDL given CAD is < 70 mg/dL. His Lipitor was increased to 80 mg at time of discharge. He has been fully compliant. Recheck FLP and HFTs in 6 weeks. If LDL  is not at goal, we will add Zetia.   3. HTN: BP is controlled on current regimen.   Follow-Up w/ Dr. Irish Lack in 6-8 weeks.   Rahma Meller Ladoris Gene, MHS [facility] 11/02/2016 12:36 PM

## 2016-12-14 ENCOUNTER — Other Ambulatory Visit: Payer: Commercial Managed Care - PPO | Admitting: *Deleted

## 2016-12-14 DIAGNOSIS — E785 Hyperlipidemia, unspecified: Secondary | ICD-10-CM

## 2016-12-14 LAB — HEPATIC FUNCTION PANEL
ALT: 38 IU/L (ref 0–44)
AST: 26 IU/L (ref 0–40)
Albumin: 4.3 g/dL (ref 3.5–5.5)
Alkaline Phosphatase: 100 IU/L (ref 39–117)
BILIRUBIN, DIRECT: 0.2 mg/dL (ref 0.00–0.40)
Bilirubin Total: 0.6 mg/dL (ref 0.0–1.2)
TOTAL PROTEIN: 6.8 g/dL (ref 6.0–8.5)

## 2016-12-14 LAB — LIPID PANEL
CHOL/HDL RATIO: 2.4 ratio (ref 0.0–5.0)
Cholesterol, Total: 76 mg/dL — ABNORMAL LOW (ref 100–199)
HDL: 32 mg/dL — AB (ref >39)
LDL Calculated: 33 mg/dL (ref 0–99)
Triglycerides: 54 mg/dL (ref 0–149)
VLDL CHOLESTEROL CAL: 11 mg/dL (ref 5–40)

## 2016-12-27 NOTE — Progress Notes (Signed)
Cardiology Office Note   Date:  12/28/2016   ID:  Steven Kemp, DOB 06-06-1966, MRN [id number]  PCP:  Owens Loffler, MD    No chief complaint on file.    Wt Readings from Last 3 Encounters:  12/28/16 169 lb (76.7 kg)  11/02/16 180 lb (81.6 kg)  10/14/16 187 lb 4 oz (84.9 kg)       History of Present Illness: Steven Kemp is a 51 y.o. male  with h/o HTN, HLD and borderline DM, who presented to [facility] on 10/22/16 with CC of substernal chest and left arm pain with diaphoresis. He was brought in by EMS. On EMS arrival, EKG showed acute changes in the inferior lateral leads. Code STEMI was called. He was given 324 mg of ASA and 1 SL nitro with resolution of pain. He was brought to the cath lab emergently for cardiac catheterization.   Emergent LHC was performed and he was found to have an occluded 1st diagonal vessel. S/p DES x1 to 1st Diag with residualmoderate disease in the LAD, right PDA and distal LAD. LV gram showed preserved LV function. He was continued on IV tirofiban for 2 hours post cath. Plan is for DAPT with ASA/Brilinta for at least one year. His statin was increased to Lipitor 80mg  and he started on metoprolol 12.5mg  BID. His home lisinopril was restarted prior to discharge.   Of note, lipid panel at PCP office 10/12/16 showed LDL at 80 mg/dL. HgbA1c was WNL at 6.3.  He was seen by Lyda Jester a few weeks after the cath, with history as described above.    Cath in 5/18 showed:  Ost RPDA to RPDA lesion, 80 %stenosed. Bifurcation of PDA /PLA occurs more proximally.  Prox Cx lesion, 50 %stenosed.  Prox LAD to Mid LAD lesion, 25 %stenosed.  Dist LAD lesion, 90 %stenosed. This is at the apex and not amenable to PCI.  1st Diag lesion, 95 %stenosed. A STENT PROMUS PREM MR 2.25X16 drug eluting stent was successfully placed, postdilated to > 2.5 mm.  Post intervention, there is a 0% residual stenosis.  The left ventricular systolic function is  normal.  LV end diastolic pressure is normal.  The left ventricular ejection fraction is 50-55% by visual estimate.  There is no aortic valve stenosis.  Denies : Chest pain. Dizziness. Leg edema.  Orthopnea. Palpitations. Paroxysmal nocturnal dyspnea. Shortness of breath. Syncope.   He had some chest pain, different than his angina, but it made him nervous.  No sx like his MI.  He uses an exercise bike regularly, without difficulty, no anginal sx.  He has lost weight  After changing his diet.  He decreased sugar intake as well.    Made a trip to [place] without difficulty.  He has an Rx for Viagra, that he has not filled.  He is interested in using this.  He did try some NTG because he was not sure about his chest.       Past Medical History:  Diagnosis Date  . CAD (coronary artery disease), native coronary artery    10/22/16 PCI with DES--> 1st diag  . Hyperlipidemia   . Hypertension     Past Surgical History:  Procedure Laterality Date  . CORONARY STENT INTERVENTION N/A 10/22/2016   Procedure: Coronary Stent Intervention;  Surgeon: Jettie Booze, MD;  Location: [place] [facility];  Service: Cardiovascular;  Laterality: N/A;  . LEFT HEART CATH AND CORONARY ANGIOGRAPHY N/A 10/22/2016   Procedure: Left Heart  Cath and Coronary Angiography;  Surgeon: Jettie Booze, MD;  Location: [place] [facility];  Service: Cardiovascular;  Laterality: N/A;     Current Outpatient Prescriptions  Medication Sig Dispense Refill  . aspirin 81 MG chewable tablet Chew 1 tablet (81 mg total) by mouth daily.    Marland Kitchen atorvastatin (LIPITOR) 80 MG tablet Take 1 tablet (80 mg total) by mouth daily at 6 PM. 30 tablet 6  . lisinopril (PRINIVIL,ZESTRIL) 10 MG tablet TAKE 1 TABLET ONCE DAILY FOR BLOOD PRESSURE 90 tablet 0  . metoprolol tartrate (LOPRESSOR) 25 MG tablet Take 0.5 tablets (12.5 mg total) by mouth 2 (two) times daily. 60 tablet 3  . nitroGLYCERIN (NITROSTAT) 0.4 MG SL tablet Place 1  tablet (0.4 mg total) under the tongue every 5 (five) minutes as needed. 25 tablet 3  . ticagrelor (BRILINTA) 90 MG TABS tablet Take 1 tablet (90 mg total) by mouth every 12 (twelve) hours. 60 tablet 10   No current facility-administered medications for this visit.     Allergies:   Patient has no known allergies.    Social History:  The patient  reports that he has never smoked. He has never used smokeless tobacco. He reports that he drinks alcohol. He reports that he does not use drugs.   Family History:  The patient's family history includes Hypertension in his father.    ROS:  Please see the history of present illness.   Otherwise, review of systems are positive for intentional weight loss.   All other systems are reviewed and negative.    PHYSICAL EXAM: VS:  BP 102/60   Pulse 73   Ht 5\' 8"  (1.727 m)   Wt 169 lb (76.7 kg)   SpO2 (!) 73%   BMI 25.70 kg/m  , BMI Body mass index is 25.7 kg/m. GEN: Well nourished, well developed, in no acute distress  HEENT: normal  Neck: no JVD, carotid bruits, or masses Cardiac: RRR; no murmurs, rubs, or gallops,no edema  Respiratory:  clear to auscultation bilaterally, normal work of breathing GI: soft, nontender, nondistended, + BS MS: no deformity or atrophy  Skin: warm and dry, no rash Neuro:  Strength and sensation are intact Psych: euthymic mood, full affect   Recent Labs: 10/23/2016: BUN 13; Creatinine, Ser 0.93; Hemoglobin 15.2; Platelets 159; Potassium 4.1; Sodium 134 12/14/2016: ALT 38   Lipid Panel    Component Value Date/Time   CHOL 76 (L) 12/14/2016 0732   TRIG 54 12/14/2016 0732   HDL 32 (L) 12/14/2016 0732   CHOLHDL 2.4 12/14/2016 0732   CHOLHDL 4 10/12/2016 0918   VLDL 20.0 10/12/2016 0918   LDLCALC 33 12/14/2016 0732   LDLDIRECT 103.0 06/18/2015 1603     Other studies Reviewed: Additional studies/ records that were reviewed today with results demonstrating: cath results reviewed.   ASSESSMENT AND  PLAN:  1. CAD / Old MI: Atypical chest pain noted. No clear anginal symptoms on current medical therapy. Continue aggressive secondary prevention.  Residual CAD managed medically. 2. Hyperlipidemia: Lipids are well-controlled on current lipid-lowering therapy. 3. HTN: Blood pressure well controlled.  Continue current meds. 4. Continue lifestyle changes including decreasing sugar intake and increased exercise. This will help with his risk factors and his overall health. 5. Ok to resume sexual activity.  Counseled about not using Viagra within 72 hours of SL NTG.   Current medicines are reviewed at length with the patient today.  The patient concerns regarding his medicines were addressed.  The following  changes have been made:  No change  Labs/ tests ordered today include:  No orders of the defined types were placed in this encounter.   Recommend 150 minutes/week of aerobic exercise Low fat, low carb, high fiber diet recommended  Disposition:   FU in 6 months   Signed, Larae Grooms, MD  12/28/2016 9:31 AM    [facility] [place], [place], [place]  [address] Phone: [phone]; Fax: [phone]

## 2016-12-28 ENCOUNTER — Encounter: Payer: Self-pay | Admitting: Interventional Cardiology

## 2016-12-28 ENCOUNTER — Ambulatory Visit (INDEPENDENT_AMBULATORY_CARE_PROVIDER_SITE_OTHER): Payer: Commercial Managed Care - PPO | Admitting: Interventional Cardiology

## 2016-12-28 VITALS — BP 102/60 | HR 73 | Ht 68.0 in | Wt 169.0 lb

## 2016-12-28 DIAGNOSIS — E782 Mixed hyperlipidemia: Secondary | ICD-10-CM | POA: Diagnosis not present

## 2016-12-28 DIAGNOSIS — I10 Essential (primary) hypertension: Secondary | ICD-10-CM

## 2016-12-28 DIAGNOSIS — I2510 Atherosclerotic heart disease of native coronary artery without angina pectoris: Secondary | ICD-10-CM | POA: Insufficient documentation

## 2016-12-28 DIAGNOSIS — I252 Old myocardial infarction: Secondary | ICD-10-CM | POA: Diagnosis not present

## 2016-12-28 DIAGNOSIS — I25118 Atherosclerotic heart disease of native coronary artery with other forms of angina pectoris: Secondary | ICD-10-CM | POA: Diagnosis not present

## 2016-12-28 NOTE — Patient Instructions (Signed)

## 2017-01-13 ENCOUNTER — Other Ambulatory Visit: Payer: Self-pay | Admitting: Family Medicine

## 2017-04-13 ENCOUNTER — Telehealth: Payer: Self-pay | Admitting: Interventional Cardiology

## 2017-04-13 NOTE — Telephone Encounter (Signed)
New message       [facility] Pre-operative Risk Assessment    Request for surgical clearance:  1. What type of surgery is being performed? vasectomy  2. When is this surgery scheduled? n/a  3. Are there any medications that need to be held prior to surgery and how long? ticagrelor (BRILINTA) 90 MG TABS tablet  4. Practice name and name of physician performing surgery? Dr Diona Fanti  5. What is your office phone and fax number? Phone # [phone], fax # [phone] ATTN: Anderson Malta  6. Anesthesia type (None, local, MAC, general) ? local   Laurier Nancy 04/13/2017, 8:48 AM  _________________________________________________________________   (provider comments below)

## 2017-04-13 NOTE — Telephone Encounter (Signed)
Faxed encounter to Dr Dahlstedt's office via [facility].

## 2017-04-13 NOTE — Telephone Encounter (Signed)
    Chart reviewed as part of pre-operative protocol coverage. Patient was contacted 04/13/2017 in reference to pre-operative risk assessment for pending surgery as outlined below.  Tige Meas was last seen on 12/28/16 by Dr. Irish Lack.  Since that day, Latravis Grine has done well.  I called him on 04/13/17. He denies any new symptoms and has been compliant on his medications. According to the RCRI, he has a 0.9% risk of perioperative risk of major cardiac event and has a activity capacity of at least 4.0 METS.  He had a DES placed on 10/22/16. He can NOT stop ASA and brilinta for at least one year. I relayed this to the patient. Please advise urology office that he can't stop DAPT until 10/23/17.  Therefore, based on ACC/AHA guidelines, the patient would be at acceptable risk for the planned procedure without further cardiovascular testing.   Tami Lin Natalie Mceuen, PA 04/13/2017, 2:59 PM

## 2017-04-14 ENCOUNTER — Other Ambulatory Visit: Payer: Self-pay | Admitting: Cardiology

## 2017-04-15 ENCOUNTER — Telehealth: Payer: Self-pay | Admitting: Interventional Cardiology

## 2017-04-15 NOTE — Telephone Encounter (Signed)
Patient stated that [facility] state that [facility] declined for him to have his surgery. Informed patient of the following- "He had a DES placed on 10/22/16. He can NOT stop ASA and brilinta for at least one year. I relayed this to the patient. Please advise urology office that he can't stop DAPT until 10/23/17."  Informed patient that [facility] might be declining for him to have surgery, because he cannot stop his ASA and Brilinta. Informed patient that he would need to talk to [facility] about this. Patient stated he wanted our office to call [facility] and discuss this with them. [facility] and left a message with the nurse. In the message, requested that the nurse at [facility] to call patient and explain the reason behind declining his surgery.

## 2017-04-15 NOTE — Telephone Encounter (Signed)
New message    Pt is calling to find out about his clearance.

## 2017-05-10 ENCOUNTER — Other Ambulatory Visit: Payer: Self-pay | Admitting: Cardiology

## 2017-05-25 ENCOUNTER — Telehealth: Payer: Self-pay | Admitting: Interventional Cardiology

## 2017-05-25 NOTE — Telephone Encounter (Signed)
Patient calling and states that he has noticed bruising underneath his armpits for the past several weeks. Patient denies having bruising anywhere else. Patient denies having any blood in his urine or stool, nosebleeds, or any other S/Sx of bleeding. Patient states that he has been more active and is not sure if this is causing it. Advised patient to follow up with PCP and continue to monitor and let us know if he develops any S/Sx of bleeding. Patient verbalized understanding. Scheduled appointment for 07/08/17 for 6 month f/u per recall.

## 2017-05-25 NOTE — Telephone Encounter (Signed)
New Message  Pt call requesting to speak with RN about bruises under his arms. Please call back to discuss

## 2017-07-07 NOTE — Progress Notes (Signed)
Cardiology Office Note   Date:  07/08/2017   ID:  Steven Kemp, DOB [birth year], MRN [id number]  PCP:  Owens Loffler, MD    No chief complaint on file. CAD   Wt Readings from Last 3 Encounters:  07/08/17 163 lb 12.8 oz (74.3 kg)  12/28/16 169 lb (76.7 kg)  11/02/16 180 lb (81.6 kg)       History of Present Illness: Steven Kemp is a 52 y.o. male  with h/o HTN, HLD and borderline DM, who presented to [facility] on 10/22/16 with CC of substernal chest and left arm pain with diaphoresis. He was brought in by EMS. On EMS arrival,EKG showed acute changes in the inferior lateral leads. Code STEMI was called. He was given 324 mg of ASAand 1 SL nitro with resolution of pain. He was brought to the cath lab emergently for cardiac catheterization.   Emergent LHC was performed and he was found to have an occluded 1st diagonal vessel. S/p DES x1 to 1st Diag with residualmoderate disease in the LAD, right PDA and distal LAD. LV gram showed preserved LV function. He was continued on IV tirofiban for 2 hours post cath. Plan isfor DAPT with ASA/Brilinta for at least one year. His statin was increased to Lipitor 80mg  and hestarted on metoprolol 12.5mg  BID. His home lisinopril was restarted prior to discharge.     Cath in 5/18 showed:  Ost RPDA to RPDA lesion, 80 %stenosed. Bifurcation of PDA /PLA occurs more proximally.  Prox Cx lesion, 50 %stenosed.  Prox LAD to Mid LAD lesion, 25 %stenosed.  Dist LAD lesion, 90 %stenosed. This is at the apex and not amenable to PCI.  1st Diag lesion, 95 %stenosed. A STENT PROMUS PREM MR 2.25X16 drug eluting stent was successfully placed, postdilated to > 2.5 mm.  Post intervention, there is a 0% residual stenosis.  The left ventricular systolic function is normal.  LV end diastolic pressure is normal.  The left ventricular ejection fraction is 50-55% by visual estimate. There is no aortic valve stenosis.  Denies : Chest pain.  Dizziness. Leg edema. Nitroglycerin use. Orthopnea. Palpitations. Paroxysmal nocturnal dyspnea. Shortness of breath. Syncope.   He remains active.  He has an exercise machine that he uses at work.  He did have some bruises in which he does not know the cause.    Past Medical History:  Diagnosis Date  . CAD (coronary artery disease), native coronary artery    10/22/16 PCI with DES--> 1st diag  . Hyperlipidemia   . Hypertension     Past Surgical History:  Procedure Laterality Date  . CORONARY STENT INTERVENTION N/A 10/22/2016   Procedure: Coronary Stent Intervention;  Surgeon: Jettie Booze, MD;  Location: [facility];  Service: Cardiovascular;  Laterality: N/A;  . LEFT HEART CATH AND CORONARY ANGIOGRAPHY N/A 10/22/2016   Procedure: Left Heart Cath and Coronary Angiography;  Surgeon: Jettie Booze, MD;  Location: [place] [facility];  Service: Cardiovascular;  Laterality: N/A;     Current Outpatient Medications  Medication Sig Dispense Refill  . aspirin 81 MG chewable tablet Chew 1 tablet (81 mg total) by mouth daily.    Marland Kitchen atorvastatin (LIPITOR) 80 MG tablet TAKE 1 TABLET (80 MG TOTAL) BY MOUTH DAILY AT 6 PM. 30 tablet 8  . lisinopril (PRINIVIL,ZESTRIL) 10 MG tablet TAKE 1 TABLET BY MOUTH EVERY DAY 90 tablet 1  . metoprolol tartrate (LOPRESSOR) 25 MG tablet TAKE 0.5 TABLETS (12.5 MG TOTAL) BY MOUTH 2 (  TWO) TIMES DAILY. 30 tablet 8  . nitroGLYCERIN (NITROSTAT) 0.4 MG SL tablet Place 1 tablet (0.4 mg total) under the tongue every 5 (five) minutes as needed. 25 tablet 3  . ticagrelor (BRILINTA) 90 MG TABS tablet Take 1 tablet (90 mg total) by mouth every 12 (twelve) hours. 60 tablet 10   No current facility-administered medications for this visit.     Allergies:   Patient has no known allergies.    Social History:  The patient  reports that  has never smoked. he has never used smokeless tobacco. He reports that he drinks alcohol. He reports that he does not use drugs.    Family History:  The patient's family history includes Hypertension in his father.    ROS:  Please see the history of present illness.   Otherwise, review of systems are positive for easy bruising.   All other systems are reviewed and negative.    PHYSICAL EXAM: VS:  BP 124/70   Pulse 68   Ht 5\' 8"  (1.727 m)   Wt 163 lb 12.8 oz (74.3 kg)   SpO2 98%   BMI 24.91 kg/m  , BMI Body mass index is 24.91 kg/m. GEN: Well nourished, well developed, in no acute distress  HEENT: normal  Neck: no JVD, carotid bruits, or masses Cardiac: RRR; no murmurs, rubs, or gallops,no edema  Respiratory:  clear to auscultation bilaterally, normal work of breathing GI: soft, nontender, nondistended, + BS MS: no deformity or atrophy , bruising under both arms Skin: warm and dry, no rash Neuro:  Strength and sensation are intact Psych: euthymic mood, full affect    Recent Labs: 10/23/2016: BUN 13; Creatinine, Ser 0.93; Hemoglobin 15.2; Platelets 159; Potassium 4.1; Sodium 134 12/14/2016: ALT 38   Lipid Panel    Component Value Date/Time   CHOL 76 (L) 12/14/2016 0732   TRIG 54 12/14/2016 0732   HDL 32 (L) 12/14/2016 0732   CHOLHDL 2.4 12/14/2016 0732   CHOLHDL 4 10/12/2016 0918   VLDL 20.0 10/12/2016 0918   LDLCALC 33 12/14/2016 0732   LDLDIRECT 103.0 06/18/2015 1603     Other studies Reviewed: Additional studies/ records that were reviewed today with results demonstrating: Cath records reviewed.  Labs reviewed..   ASSESSMENT AND PLAN:  1. CAD/Old MI: No angina on medical therapy.  Doing well.  No signs of heart failure.  He has improved his diet.  He is continued to lose weight since his heart attack.  Brilinta is very affordable at this time. 2. Hyperlipidemia: LDL 33 in June 2018.  Continue lipid-lowering therapy. 3. HTN: Blood pressure well controlled.  Continue current medications.   4. Preoperative evaluation: Of note, he is planning a vasectomy.  He has set a date of late May since  that will be more than 12 months since his heart attack.  At that point, he could hold his Brilinta for 5 days prior to the surgery.  No further cardiac testing will be needed prior to the surgery.   Current medicines are reviewed at length with the patient today.  The patient concerns regarding his medicines were addressed.  The following changes have been made:  No change  Labs/ tests ordered today include:  No orders of the defined types were placed in this encounter.   Recommend 150 minutes/week of aerobic exercise Low fat, low carb, high fiber diet recommended  Disposition:   FU in 6 months.  At that time, will look at changing Brilinta to Plavix.  Signed, Larae Grooms, MD  07/08/2017 4:27 PM    [facility] [address], [place], [place]  [address] Phone: [phone]; Fax: [phone]

## 2017-07-08 ENCOUNTER — Encounter: Payer: Self-pay | Admitting: Interventional Cardiology

## 2017-07-08 ENCOUNTER — Ambulatory Visit: Payer: Commercial Managed Care - PPO | Admitting: Interventional Cardiology

## 2017-07-08 VITALS — BP 124/70 | HR 68 | Ht 68.0 in | Wt 163.8 lb

## 2017-07-08 DIAGNOSIS — I25118 Atherosclerotic heart disease of native coronary artery with other forms of angina pectoris: Secondary | ICD-10-CM

## 2017-07-08 DIAGNOSIS — E782 Mixed hyperlipidemia: Secondary | ICD-10-CM

## 2017-07-08 DIAGNOSIS — I252 Old myocardial infarction: Secondary | ICD-10-CM | POA: Diagnosis not present

## 2017-07-08 DIAGNOSIS — I10 Essential (primary) hypertension: Secondary | ICD-10-CM | POA: Diagnosis not present

## 2017-07-08 DIAGNOSIS — Z01810 Encounter for preprocedural cardiovascular examination: Secondary | ICD-10-CM

## 2017-07-08 NOTE — Patient Instructions (Signed)

## 2017-07-21 ENCOUNTER — Other Ambulatory Visit: Payer: Self-pay | Admitting: Family Medicine

## 2017-08-05 ENCOUNTER — Telehealth: Payer: Self-pay | Admitting: Family Medicine

## 2017-08-05 ENCOUNTER — Encounter: Payer: Self-pay | Admitting: Family Medicine

## 2017-08-05 ENCOUNTER — Ambulatory Visit: Payer: Commercial Managed Care - PPO | Admitting: Family Medicine

## 2017-08-05 ENCOUNTER — Other Ambulatory Visit: Payer: Self-pay

## 2017-08-05 VITALS — BP 100/62 | HR 61 | Temp 97.6°F | Ht 68.0 in | Wt 165.8 lb

## 2017-08-05 DIAGNOSIS — I2510 Atherosclerotic heart disease of native coronary artery without angina pectoris: Secondary | ICD-10-CM | POA: Diagnosis not present

## 2017-08-05 DIAGNOSIS — K644 Residual hemorrhoidal skin tags: Secondary | ICD-10-CM

## 2017-08-05 MED ORDER — HYDROCORTISONE ACETATE 25 MG RE SUPP: 25.0000 mg | Freq: Two times a day (BID) | RECTAL | 2 refills | Status: DC

## 2017-08-05 MED ORDER — HYDROCORTISONE 2.5 % RE CREA: 1.0000 "application " | TOPICAL_CREAM | Freq: Two times a day (BID) | RECTAL | 0 refills | Status: DC

## 2017-08-05 NOTE — Telephone Encounter (Signed)
Can you call his pharmacy and see if they will cover any suppositories for hemorrhoids?  Impossible for me to understand his contract with his insurance.

## 2017-08-05 NOTE — Telephone Encounter (Addendum)
[facility] [place].  Mr Anselmi notified by telephone that prescription has been sent to his pharmacy

## 2017-08-05 NOTE — Telephone Encounter (Signed)
Spoke with [facility] at [facility].  He states usually when a Rx isn't covered it will show the alternatives but it did not show anything with this prescription.  I called Mr. Deman and ask that he call his insurance to find out what hemorrhoid medication they will cover.  He will let us know what he finds out.  OK for [facility] to get this information from Mr. Klonowski when he calls back.

## 2017-08-05 NOTE — Telephone Encounter (Signed)
Pt calling back and states that his insurance company will cover the Proctozone cream. [facility] [address].

## 2017-08-05 NOTE — Telephone Encounter (Signed)
Copied from [facility] [phone]. Topic: Quick Communication - Rx Refill/Question >> Aug 05, 2017 10:40 AM Robina Ade, Helene Kelp D wrote: Medication: hydrocortisone (ANUSOL-HC) 25 MG suppository   Has the patient contacted their pharmacy? Yes but insurance won't cover it and he would like something else called in that is cover by insurance.   (Agent: If no, request that the patient contact the pharmacy for the refill.)   Preferred Pharmacy (with phone number or street name): [id number] [place], [place]: Please be advised that RX refills may take up to 3 business days. We ask that you follow-up with your pharmacy.

## 2017-08-05 NOTE — Progress Notes (Signed)
Dr. Frederico Hamman T. Lynelle Weiler, MD, [facility] [place], [address] Phone: [phone] Fax: [phone]  08/05/2017  Patient: Steven Kemp, MRN: [id number], DOB: 1966/01/31, 52 y.o.  Primary Physician:  Owens Loffler, MD   Chief Complaint  Patient presents with  . Hemorrhoids   Subjective:   Steven Kemp is a 52 y.o. very pleasant male patient who presents with the following:  Hemorrhoids are flared up. Has tried some prep-H.  Has beeon ongoing x 2 months.  Mild bleeding only.  Past Medical History, Surgical History, Social History, Family History, Problem List, Medications, and Allergies have been reviewed and updated if relevant.  Patient Active Problem List   Diagnosis Date Noted  . CAD (coronary artery disease) 12/28/2016  . Acute MI, lateral wall, initial episode of care ([place]) 10/22/2016  . Acute MI, lateral wall ([place])   . Borderline diabetes 09/08/2013  . BPPV (benign paroxysmal positional vertigo) 02/10/2013  . Hyperlipidemia LDL goal <100 10/09/2009  . Essential hypertension 10/09/2009  . EXTERNAL HEMORRHOIDS WITHOUT MENTION COMP 07/24/2009  . ALLERGIC  RHINITIS 02/03/2007    Past Medical History:  Diagnosis Date  . CAD (coronary artery disease), native coronary artery    10/22/16 PCI with DES--> 1st diag  . Hyperlipidemia   . Hypertension     Past Surgical History:  Procedure Laterality Date  . CORONARY STENT INTERVENTION N/A 10/22/2016   Procedure: Coronary Stent Intervention;  Surgeon: Jettie Booze, MD;  Location: [facility];  Service: Cardiovascular;  Laterality: N/A;  . LEFT HEART CATH AND CORONARY ANGIOGRAPHY N/A 10/22/2016   Procedure: Left Heart Cath and Coronary Angiography;  Surgeon: Jettie Booze, MD;  Location: [facility];  Service: Cardiovascular;  Laterality: N/A;    Social History   Socioeconomic History  . Marital status: Married    Spouse  name: Not on file  . Number of children: 2  . Years of education: Not on file  . Highest education level: Not on file  Social Needs  . Financial resource strain: Not on file  . Food insecurity - worry: Not on file  . Food insecurity - inability: Not on file  . Transportation needs - medical: Not on file  . Transportation needs - non-medical: Not on file  Occupational History  . Occupation: [employer]    Comment: Data base management  Tobacco Use  . Smoking status: Never Smoker  . Smokeless tobacco: Never Used  Substance and Sexual Activity  . Alcohol use: Yes    Comment: very rare  . Drug use: No  . Sexual activity: Not on file  Other Topics Concern  . Not on file  Social History Narrative  . Not on file    Family History  Problem Relation Age of Onset  . Hypertension Father     No Known Allergies  Medication list reviewed and updated in full in [place].   GEN: No acute illnesses, no fevers, chills. GI: No n/v/d, eating normally Pulm: No SOB Interactive and getting along well at home.  Otherwise, ROS is as per the HPI.  Objective:   BP 100/62   Pulse 61   Temp 97.6 F (36.4 C) (Oral)   Ht 5\' 8"  (1.727 m)   Wt 165 lb 12 oz (75.2 kg)   BMI 25.20 kg/m   GEN: WDWN, NAD, Non-toxic, A & O x 3 HEENT:  Atraumatic, Normocephalic. Neck supple. No masses, No LAD. Ears and Nose: No external deformity. CV: RRR, No M/G/R. No JVD. No thrill. No extra heart sounds. PULM: CTA B, no wheezes, crackles, rhonchi. No retractions. No resp. distress. No accessory muscle use. EXTR: No c/c/e NEURO Normal gait.  PSYCH: Normally interactive. Conversant. Not depressed or anxious appearing.  Calm demeanor.  Ext rectal exam shows mild R sided hemorrhoid, external, minimally swollen.  Laboratory and Imaging Data:  Assessment and Plan:   Hemorrhoids, external, with complication  Coronary artery disease involving native coronary  artery of native heart without angina pectoris   Reassurance. Supp bid  Follow-up: No Follow-up on file.  Meds ordered this encounter  Medications  . hydrocortisone (ANUSOL-HC) 25 MG suppository    Sig: Place 1 suppository (25 mg total) rectally 2 (two) times daily.    Dispense:  24 suppository    Refill:  2   Signed,  Jaideep Pollack T. Royalti Schauf, MD   Allergies as of 08/05/2017   No Known Allergies     Medication List        Accurate as of 08/05/17 10:35 AM. Always use your most recent med list.          aspirin 81 MG chewable tablet Chew 1 tablet (81 mg total) by mouth daily.   atorvastatin 80 MG tablet Commonly known as:  LIPITOR TAKE 1 TABLET (80 MG TOTAL) BY MOUTH DAILY AT 6 PM.   hydrocortisone 25 MG suppository Commonly known as:  ANUSOL-HC Place 1 suppository (25 mg total) rectally 2 (two) times daily.   lisinopril 10 MG tablet Commonly known as:  PRINIVIL,ZESTRIL TAKE 1 TABLET BY MOUTH EVERY DAY   metoprolol tartrate 25 MG tablet Commonly known as:  LOPRESSOR TAKE 0.5 TABLETS (12.5 MG TOTAL) BY MOUTH 2 (TWO) TIMES DAILY.   nitroGLYCERIN 0.4 MG SL tablet Commonly known as:  NITROSTAT Place 1 tablet (0.4 mg total) under the tongue every 5 (five) minutes as needed.   ticagrelor 90 MG Tabs tablet Commonly known as:  BRILINTA Take 1 tablet (90 mg total) by mouth every 12 (twelve) hours.

## 2017-08-05 NOTE — Telephone Encounter (Addendum)
I looked up formulary online for [facility].  It looks like Procotsol HC Cream and Proctozone HC Cream are Tier 1.

## 2017-08-05 NOTE — Addendum Note (Signed)
Addended by: Carter Kitten on: 08/05/2017 04:31 PM   Modules accepted: Orders

## 2017-08-05 NOTE — Telephone Encounter (Signed)
Very good 

## 2017-08-28 ENCOUNTER — Other Ambulatory Visit: Payer: Self-pay | Admitting: Family Medicine

## 2017-08-30 NOTE — Telephone Encounter (Signed)
Last office visit 08/05/2017.  Last refilled 08/05/2017 for 30 g with no refills.  Ok to refill?

## 2017-09-29 ENCOUNTER — Ambulatory Visit: Payer: Self-pay

## 2017-09-29 ENCOUNTER — Ambulatory Visit: Payer: Commercial Managed Care - PPO | Admitting: Family Medicine

## 2017-09-29 ENCOUNTER — Encounter: Payer: Self-pay | Admitting: Family Medicine

## 2017-09-29 ENCOUNTER — Other Ambulatory Visit: Payer: Self-pay

## 2017-09-29 VITALS — BP 120/78 | HR 80 | Temp 98.6°F | Ht 68.0 in | Wt 165.5 lb

## 2017-09-29 DIAGNOSIS — W540XXA Bitten by dog, initial encounter: Secondary | ICD-10-CM | POA: Diagnosis not present

## 2017-09-29 DIAGNOSIS — J069 Acute upper respiratory infection, unspecified: Secondary | ICD-10-CM | POA: Diagnosis not present

## 2017-09-29 DIAGNOSIS — S61459A Open bite of unspecified hand, initial encounter: Secondary | ICD-10-CM

## 2017-09-29 DIAGNOSIS — K644 Residual hemorrhoidal skin tags: Secondary | ICD-10-CM

## 2017-09-29 MED ORDER — HYDROCORTISONE ACETATE 25 MG RE SUPP: 25.0000 mg | Freq: Two times a day (BID) | RECTAL | 3 refills | Status: DC

## 2017-09-29 NOTE — Telephone Encounter (Signed)
Patient called to ask if he needed a shot from a dog bite that happened 2 weeks ago. He says he mentioned it to Dr. Lorelei Pont today in the office and nothing was said about a shot and he just thought about it. He would like a call back with the decision of Dr. Lorelei Pont.   Reason for Disposition . [1] Caller requesting NON-URGENT health information AND [2] PCP's office is the best resource  Answer Assessment - Initial Assessment Questions 1. REASON FOR CALL or QUESTION: "What is your reason for calling today?" or "How can I best help you?" or "What question do you have that I can help answer?"     I had a dog bite 2 weeks ago and wanted to know if I needed a shot.  Protocols used: INFORMATION ONLY CALL-A-AH

## 2017-09-29 NOTE — Progress Notes (Signed)
Dr. Frederico Hamman T. Mirai Greenwood, MD, [facility] [place], [address] Phone: [phone] Fax: [phone]  09/29/2017  Patient: Steven Kemp, MRN: [id number], DOB: 1965/09/02, 52 y.o.  Primary Physician:  Owens Loffler, MD   Chief Complaint  Patient presents with  . Cough  . Nasal Congestion   Subjective:   Steven Kemp is a 52 y.o. very pleasant male patient who presents with the following:  Everyone in the office, had a lot of cough and did not have a fever.  He has had some cough and congestion over the last couple of days.  Minimal to mild sore throat.  Cough is not productive of any significant sputum.  He has had some runny nose and nasal congestion as well as postnasal drainage.  He globally does not feel all that great.  He also reports to me that he had a dog bite on his hand approximately 2 weeks ago.  This was from his neighbors dog.  Never reported to him that he did have his rabies vaccination this year.  He is asymptomatic and does not have any redness or warmth surrounding the bite.  He continues to have issues with hemorrhoids.   Past Medical History, Surgical History, Social History, Family History, Problem List, Medications, and Allergies have been reviewed and updated if relevant.  Patient Active Problem List   Diagnosis Date Noted  . CAD (coronary artery disease) 12/28/2016  . Acute MI, lateral wall, initial episode of care ([place]) 10/22/2016  . Acute MI, lateral wall ([place])   . Borderline diabetes 09/08/2013  . BPPV (benign paroxysmal positional vertigo) 02/10/2013  . Hyperlipidemia LDL goal <100 10/09/2009  . Essential hypertension 10/09/2009  . EXTERNAL HEMORRHOIDS WITHOUT MENTION COMP 07/24/2009  . ALLERGIC  RHINITIS 02/03/2007    Past Medical History:  Diagnosis Date  . CAD (coronary artery disease), native coronary artery    10/22/16 PCI with DES--> 1st diag  . Hyperlipidemia   .  Hypertension     Past Surgical History:  Procedure Laterality Date  . CORONARY STENT INTERVENTION N/A 10/22/2016   Procedure: Coronary Stent Intervention;  Surgeon: Jettie Booze, MD;  Location: [facility];  Service: Cardiovascular;  Laterality: N/A;  . LEFT HEART CATH AND CORONARY ANGIOGRAPHY N/A 10/22/2016   Procedure: Left Heart Cath and Coronary Angiography;  Surgeon: Jettie Booze, MD;  Location: [facility];  Service: Cardiovascular;  Laterality: N/A;    Social History   Socioeconomic History  . Marital status: Married    Spouse name: Not on file  . Number of children: 2  . Years of education: Not on file  . Highest education level: Not on file  Occupational History  . Occupation: [employer] copy: [employer]    Comment: Data base management  Social Needs  . Financial resource strain: Not on file  . Food insecurity:    Worry: Not on file    Inability: Not on file  . Transportation needs:    Medical: Not on file    Non-medical: Not on file  Tobacco Use  . Smoking status: Never Smoker  . Smokeless tobacco: Never Used  Substance and Sexual Activity  . Alcohol use: Yes    Comment: very rare  . Drug use: No  . Sexual activity: Not on file  Lifestyle  . Physical activity:    Days per week: Not on file  Minutes per session: Not on file  . Stress: Not on file  Relationships  . Social connections:    Talks on phone: Not on file    Gets together: Not on file    Attends religious service: Not on file    Active member of club or organization: Not on file    Attends meetings of clubs or organizations: Not on file    Relationship status: Not on file  . Intimate partner violence:    Fear of current or ex partner: Not on file    Emotionally abused: Not on file    Physically abused: Not on file    Forced sexual activity: Not on file  Other Topics Concern  . Not on file  Social History Narrative  . Not on file    Family  History  Problem Relation Age of Onset  . Hypertension Father     No Known Allergies  Medication list reviewed and updated in full in [facility].  ROS: GEN: Acute illness details above GI: Tolerating PO intake GU: maintaining adequate hydration and urination Pulm: No SOB Interactive and getting along well at home.  Otherwise, ROS is as per the HPI.  Objective:   BP 120/78   Pulse 80   Temp 98.6 F (37 C) (Oral)   Ht 5\' 8"  (1.727 m)   Wt 165 lb 8 oz (75.1 kg)   SpO2 96%   BMI 25.16 kg/m    Gen: WDWN, NAD; A & O x3, cooperative. Pleasant.Globally Non-toxic HEENT: Normocephalic and atraumatic. Throat clear, w/o exudate, R TM clear, L TM - good landmarks, No fluid present. rhinnorhea.  MMM Frontal sinuses: NT Max sinuses: NT NECK: Anterior cervical  LAD is absent CV: RRR, No M/G/R, cap refill <2 sec PULM: Breathing comfortably in no respiratory distress. no wheezing, crackles, rhonchi EXT: No c/c/e PSYCH: Friendly, good eye contact MSK: Nml gait   Laboratory and Imaging Data:  Assessment and Plan:   Acute URI  Dog bite, hand, unspecified laterality, initial encounter  Hemorrhoids, external, with complication   Supportive care regarding his URI.  We discussed dog bites in general.  I strongly urged him to obtain a copy of his neighbors dogs rabies vaccination record for conclusive proof from their vet.  Otherwise, at 2 weeks out, I believe the dog would have to be obtained and taken to [facility].  Urged him that this is time sensitive, and if he cannot confirm this then I would need to be in contact with infectious disease for their recommendations on how to proceed.  Consider hemorrhoid banding.   Follow-up: No follow-ups on file.  Meds ordered this encounter  Medications  . hydrocortisone (ANUSOL-HC) 25 MG suppository    Sig: Place 1 suppository (25 mg total) rectally 2 (two) times daily.    Dispense:  24 suppository    Refill:  3    Signed,  Allee Busk T. Adeleigh Barletta, MD   Allergies as of 09/29/2017   No Known Allergies     Medication List        Accurate as of 09/29/17 11:59 PM. Always use your most recent med list.          aspirin 81 MG chewable tablet Chew 1 tablet (81 mg total) by mouth daily.   atorvastatin 80 MG tablet Commonly known as:  LIPITOR TAKE 1 TABLET (80 MG TOTAL) BY MOUTH DAILY AT 6 PM.   hydrocortisone 25 MG suppository Commonly known as:  ANUSOL-HC Place 1 suppository (  25 mg total) rectally 2 (two) times daily.   lisinopril 10 MG tablet Commonly known as:  PRINIVIL,ZESTRIL TAKE 1 TABLET BY MOUTH EVERY DAY   metoprolol tartrate 25 MG tablet Commonly known as:  LOPRESSOR TAKE 0.5 TABLETS (12.5 MG TOTAL) BY MOUTH 2 (TWO) TIMES DAILY.   nitroGLYCERIN 0.4 MG SL tablet Commonly known as:  NITROSTAT Place 1 tablet (0.4 mg total) under the tongue every 5 (five) minutes as needed.   PROCTOSOL HC 2.5 % rectal cream Generic drug:  hydrocortisone PLACE 1 APPLICATION RECTALLY 2 (TWO) TIMES DAILY.   ticagrelor 90 MG Tabs tablet Commonly known as:  BRILINTA Take 1 tablet (90 mg total) by mouth every 12 (twelve) hours.

## 2017-09-29 NOTE — Telephone Encounter (Signed)
I reviewed with him again what we discussed in the office.

## 2017-10-08 ENCOUNTER — Other Ambulatory Visit: Payer: Self-pay | Admitting: Cardiology

## 2017-10-17 ENCOUNTER — Other Ambulatory Visit: Payer: Self-pay | Admitting: Family Medicine

## 2018-01-11 ENCOUNTER — Other Ambulatory Visit: Payer: Self-pay | Admitting: Interventional Cardiology

## 2018-01-18 ENCOUNTER — Other Ambulatory Visit: Payer: Self-pay | Admitting: Family Medicine

## 2018-01-29 ENCOUNTER — Other Ambulatory Visit: Payer: Self-pay | Admitting: Family Medicine

## 2018-01-31 NOTE — Telephone Encounter (Signed)
Last office visit 09/29/2017 for URI.  Last CPE 10/14/2016.  Sildenafil is not on current medication list.  Refill?

## 2018-02-06 ENCOUNTER — Other Ambulatory Visit: Payer: Self-pay | Admitting: Family Medicine

## 2018-02-08 ENCOUNTER — Other Ambulatory Visit: Payer: Self-pay | Admitting: Family Medicine

## 2018-02-08 DIAGNOSIS — E1065 Type 1 diabetes mellitus with hyperglycemia: Secondary | ICD-10-CM

## 2018-02-08 DIAGNOSIS — Z0000 Encounter for general adult medical examination without abnormal findings: Secondary | ICD-10-CM

## 2018-02-08 DIAGNOSIS — Z114 Encounter for screening for human immunodeficiency virus [HIV]: Secondary | ICD-10-CM

## 2018-02-08 DIAGNOSIS — R7303 Prediabetes: Secondary | ICD-10-CM

## 2018-02-09 ENCOUNTER — Other Ambulatory Visit (INDEPENDENT_AMBULATORY_CARE_PROVIDER_SITE_OTHER): Payer: Commercial Managed Care - PPO

## 2018-02-09 DIAGNOSIS — R7303 Prediabetes: Secondary | ICD-10-CM | POA: Diagnosis not present

## 2018-02-09 DIAGNOSIS — Z114 Encounter for screening for human immunodeficiency virus [HIV]: Secondary | ICD-10-CM

## 2018-02-09 DIAGNOSIS — Z0000 Encounter for general adult medical examination without abnormal findings: Secondary | ICD-10-CM

## 2018-02-09 LAB — CBC WITH DIFFERENTIAL/PLATELET
BASOS PCT: 0.2 % (ref 0.0–3.0)
Basophils Absolute: 0 10*3/uL (ref 0.0–0.1)
EOS ABS: 0.2 10*3/uL (ref 0.0–0.7)
Eosinophils Relative: 2.7 % (ref 0.0–5.0)
HEMATOCRIT: 47.8 % (ref 39.0–52.0)
Hemoglobin: 16.4 g/dL (ref 13.0–17.0)
LYMPHS PCT: 29.2 % (ref 12.0–46.0)
Lymphs Abs: 1.7 10*3/uL (ref 0.7–4.0)
MCHC: 34.3 g/dL (ref 30.0–36.0)
MCV: 88.4 fl (ref 78.0–100.0)
MONO ABS: 0.5 10*3/uL (ref 0.1–1.0)
Monocytes Relative: 8.5 % (ref 3.0–12.0)
NEUTROS ABS: 3.5 10*3/uL (ref 1.4–7.7)
Neutrophils Relative %: 59.4 % (ref 43.0–77.0)
PLATELETS: 195 10*3/uL (ref 150.0–400.0)
RBC: 5.41 Mil/uL (ref 4.22–5.81)
RDW: 12.8 % (ref 11.5–15.5)
WBC: 5.8 10*3/uL (ref 4.0–10.5)

## 2018-02-09 LAB — BASIC METABOLIC PANEL
BUN: 12 mg/dL (ref 6–23)
CO2: 29 mEq/L (ref 19–32)
Calcium: 9.6 mg/dL (ref 8.4–10.5)
Chloride: 102 mEq/L (ref 96–112)
Creatinine, Ser: 1.02 mg/dL (ref 0.40–1.50)
GFR: 98.69 mL/min (ref >60.00)
Glucose, Bld: 105 mg/dL — ABNORMAL HIGH (ref 70–99)
POTASSIUM: 4.5 meq/L (ref 3.5–5.1)
Sodium: 138 mEq/L (ref 135–145)

## 2018-02-09 LAB — HEPATIC FUNCTION PANEL
ALT: 26 U/L (ref 0–53)
AST: 17 U/L (ref 0–37)
Albumin: 4.4 g/dL (ref 3.5–5.2)
Alkaline Phosphatase: 71 U/L (ref 39–117)
BILIRUBIN TOTAL: 0.7 mg/dL (ref 0.2–1.2)
Bilirubin, Direct: 0.1 mg/dL (ref 0.0–0.3)
Total Protein: 7.3 g/dL (ref 6.0–8.3)

## 2018-02-09 LAB — HEMOGLOBIN A1C: Hgb A1c MFr Bld: 6 % (ref 4.6–6.5)

## 2018-02-09 LAB — LIPID PANEL
CHOLESTEROL: 107 mg/dL (ref 0–200)
HDL: 38.6 mg/dL — AB (ref >39.00)
LDL CALC: 56 mg/dL (ref 0–99)
NonHDL: 68.21
TRIGLYCERIDES: 63 mg/dL (ref 0.0–149.0)
Total CHOL/HDL Ratio: 3
VLDL: 12.6 mg/dL (ref 0.0–40.0)

## 2018-02-09 LAB — PSA: PSA: 1.37 ng/mL (ref 0.10–4.00)

## 2018-02-10 LAB — HIV ANTIBODY (ROUTINE TESTING W REFLEX): HIV 1&2 Ab, 4th Generation: NONREACTIVE

## 2018-02-11 ENCOUNTER — Other Ambulatory Visit: Payer: Self-pay | Admitting: Interventional Cardiology

## 2018-02-15 ENCOUNTER — Encounter: Payer: Self-pay | Admitting: Family Medicine

## 2018-02-15 NOTE — Progress Notes (Signed)
Dr. Frederico Hamman T. Chanc Kervin, MD, [facility] Medicine [place], [address] Phone: [phone] Fax: [phone]  02/16/2018  Patient: Steven Kemp, MRN: [id number], DOB: 1966/04/02, 52 y.o.  Primary Physician:  Owens Loffler, MD   Chief Complaint  Patient presents with  . Annual Exam   Subjective:   Steven Kemp is a 52 y.o. pleasant patient who presents with the following:  Preventative Health Maintenance Visit:  Health Maintenance Summary Reviewed and updated, unless pt declines services.  Tobacco History Reviewed. Alcohol: No concerns, no excessive use Exercise Habits: Some activity, rec at least 30 mins 5 times a week STD concerns: no risk or activity to increase risk Drug Use: None Encouraged self-testicular check  Tdap vaccine  Took 3 ntg at work 2 weeks ago  Health Maintenance  Topic Date Due  . Samul Dada  04/05/1985  . COLONOSCOPY  04/05/2016  . INFLUENZA VACCINE  01/20/2018  . HIV Screening  Completed    There is no immunization history on file for this patient. Patient Active Problem List   Diagnosis Date Noted  . CAD (coronary artery disease) 12/28/2016  . Acute MI, lateral wall ([place])   . Borderline diabetes 09/08/2013  . BPPV (benign paroxysmal positional vertigo) 02/10/2013  . Hyperlipidemia LDL goal <100 10/09/2009  . Essential hypertension 10/09/2009  . EXTERNAL HEMORRHOIDS WITHOUT MENTION COMP 07/24/2009  . ALLERGIC  RHINITIS 02/03/2007   Past Medical History:  Diagnosis Date  . CAD (coronary artery disease), native coronary artery    10/22/16 PCI with DES--> 1st diag  . Hyperlipidemia   . Hypertension    Past Surgical History:  Procedure Laterality Date  . CORONARY STENT INTERVENTION N/A 10/22/2016   Procedure: Coronary Stent Intervention;  Surgeon: Jettie Booze, MD;  Location: [facility];  Service: Cardiovascular;  Laterality: N/A;  . LEFT HEART CATH AND  CORONARY ANGIOGRAPHY N/A 10/22/2016   Procedure: Left Heart Cath and Coronary Angiography;  Surgeon: Jettie Booze, MD;  Location: [facility];  Service: Cardiovascular;  Laterality: N/A;   Social History   Socioeconomic History  . Marital status: Married    Spouse name: Not on file  . Number of children: 2  . Years of education: Not on file  . Highest education level: Not on file  Occupational History  . Occupation: [employer]: [employer]    Comment: Data base management  Social Needs  . Financial resource strain: Not on file  . Food insecurity:    Worry: Not on file    Inability: Not on file  . Transportation needs:    Medical: Not on file    Non-medical: Not on file  Tobacco Use  . Smoking status: Never Smoker  . Smokeless tobacco: Never Used  Substance and Sexual Activity  . Alcohol use: Yes    Comment: very rare  . Drug use: No  . Sexual activity: Not on file  Lifestyle  . Physical activity:    Days per week: Not on file    Minutes per session: Not on file  . Stress: Not on file  Relationships  . Social connections:    Talks on phone: Not on file    Gets together: Not on file    Attends religious service: Not on file    Active member of club or organization: Not on file    Attends meetings of clubs or organizations: Not on file  Relationship status: Not on file  . Intimate partner violence:    Fear of current or ex partner: Not on file    Emotionally abused: Not on file    Physically abused: Not on file    Forced sexual activity: Not on file  Other Topics Concern  . Not on file  Social History Narrative  . Not on file   Family History  Problem Relation Age of Onset  . Hypertension Father    No Known Allergies  Medication list has been reviewed and updated.   General: Denies fever, chills, sweats. No significant weight loss. Eyes: Denies blurring,significant itching ENT: Denies earache, sore throat, and  hoarseness. Cardiovascular: Denies chest pains, palpitations, dyspnea on exertion Respiratory: Denies cough, dyspnea at rest,wheeezing Breast: no concerns about lumps GI: Denies nausea, vomiting, diarrhea, constipation, change in bowel habits, abdominal pain, melena, hematochezia GU: Denies penile discharge, ED, urinary flow / outflow problems. No STD concerns. Musculoskeletal: Denies back pain, joint pain Derm: Denies rash, itching Neuro: Denies  paresthesias, frequent falls, frequent headaches Psych: Denies depression, anxiety Endocrine: Denies cold intolerance, heat intolerance, polydipsia Heme: Denies enlarged lymph nodes Allergy: No hayfever  Objective:   BP 120/90   Pulse 74   Temp 97.8 F (36.6 C) (Oral)   Ht 5' 7" (1.702 m)   Wt 162 lb (73.5 kg)   BMI 25.37 kg/m  Ideal Body Weight: Weight in (lb) to have BMI = 25: 159.3  No exam data present  GEN: well developed, well nourished, no acute distress Eyes: conjunctiva and lids normal, PERRLA, EOMI ENT: TM clear, nares clear, oral exam WNL Neck: supple, no lymphadenopathy, no thyromegaly, no JVD Pulm: clear to auscultation and percussion, respiratory effort normal CV: regular rate and rhythm, S1-S2, no murmur, rub or gallop, no bruits, peripheral pulses normal and symmetric, no cyanosis, clubbing, edema or varicosities GI: soft, non-tender; no hepatosplenomegaly, masses; active bowel sounds all quadrants GU: no hernia, testicular mass, penile discharge Lymph: no cervical, axillary or inguinal adenopathy MSK: gait normal, muscle tone and strength WNL, no joint swelling, effusions, discoloration, crepitus  SKIN: clear, good turgor, color WNL, no rashes, lesions, or ulcerations Neuro: normal mental status, normal strength, sensation, and motion Psych: alert; oriented to person, place and time, normally interactive and not anxious or depressed in appearance. All labs reviewed with patient.  Lipids:    Component Value  Date/Time   CHOL 107 02/09/2018 0743   CHOL 76 (L) 12/14/2016 0732   TRIG 63.0 02/09/2018 0743   HDL 38.60 (L) 02/09/2018 0743   HDL 32 (L) 12/14/2016 0732   LDLDIRECT 103.0 06/18/2015 1603   VLDL 12.6 02/09/2018 0743   CHOLHDL 3 02/09/2018 0743   CBC: CBC Latest Ref Rng & Units 02/09/2018 10/23/2016 10/22/2016  WBC 4.0 - 10.5 K/uL 5.8 10.0 -  Hemoglobin 13.0 - 17.0 g/dL 16.4 15.2 16.0  Hematocrit 39.0 - 52.0 % 47.8 44.9 47.0  Platelets 150.0 - 400.0 K/uL 195.0 159 -    Basic Metabolic Panel:    Component Value Date/Time   NA 138 02/09/2018 0743   K 4.5 02/09/2018 0743   CL 102 02/09/2018 0743   CO2 29 02/09/2018 0743   BUN 12 02/09/2018 0743   CREATININE 1.02 02/09/2018 0743   GLUCOSE 105 (H) 02/09/2018 0743   CALCIUM 9.6 02/09/2018 0743   Hepatic Function Latest Ref Rng & Units 02/09/2018 12/14/2016 10/14/2016  Total Protein 6.0 - 8.3 g/dL 7.3 6.8 7.3  Albumin 3.5 - 5.2 g/dL 4.4 4.3  4.4  AST 0 - 37 U/L _0 ALT 0 - 53 U/L 26 38 20  Alk Phosphatase 39 - 117 U/L 71 100 69  Total Bilirubin 0.2 - 1.2 mg/dL 0.7 0.6 0.3  Bilirubin, Direct 0.0 - 0.3 mg/dL 0.1 0.20 0.1    Lab Results  Component Value Date   TSH 2.20 06/18/2015   Lab Results  Component Value Date   PSA 1.37 02/09/2018   PSA 1.51 10/12/2016   PSA 1.50 09/04/2013    Assessment and Plan:   Healthcare maintenance  Tdap today He has cardiology upcoming follow-up  Health Maintenance Exam: The patient's preventative maintenance and recommended screening tests for an annual wellness exam were reviewed in full today. Brought up to date unless services declined.  Counselled on the importance of diet, exercise, and its role in overall health and mortality. The patient's FH and SH was reviewed, including their home life, tobacco status, and drug and alcohol status.  Follow-up in 1 year for physical exam or additional follow-up below.  Follow-up: No follow-ups on file. Or follow-up in 1 year if not  noted.  Signed,  [name]. Jones Viviani, MD   Allergies as of 02/16/2018   No Known Allergies     Medication List        Accurate as of 02/16/18  2:44 PM. Always use your most recent med list.          aspirin 81 MG chewable tablet Chew 1 tablet (81 mg total) by mouth daily.   atorvastatin 80 MG tablet Commonly known as:  LIPITOR TAKE 1 TABLET (80 MG TOTAL) BY MOUTH DAILY AT 6 PM.   BRILINTA 90 MG Tabs tablet Generic drug:  ticagrelor TAKE 1 TABLET (90 MG TOTAL) BY MOUTH EVERY 12 (TWELVE) HOURS   hydrocortisone 25 MG suppository Commonly known as:  ANUSOL-HC Place 1 suppository (25 mg total) rectally 2 (two) times daily.   lisinopril 10 MG tablet Commonly known as:  PRINIVIL,ZESTRIL TAKE 1 TABLET BY MOUTH EVERY DAY   metoprolol tartrate 25 MG tablet Commonly known as:  LOPRESSOR TAKE HALF TABLET BY MOUTH 2 (TWO) TIMES DAILY.   nitroGLYCERIN 0.4 MG SL tablet Commonly known as:  NITROSTAT PLACE 1 TABLET (0.4 MG TOTAL) UNDER THE TONGUE EVERY 5 (FIVE) MINUTES AS NEEDED.   PROCTOSOL HC 2.5 % rectal cream Generic drug:  hydrocortisone PLACE 1 APPLICATION RECTALLY 2 (TWO) TIMES DAILY.   sildenafil 20 MG tablet Commonly known as:  REVATIO TAKE 2 TO 5 TABLETS (40-100MG) BY MOUTH 30 MINUTES PRIOR TO INTERCOURSE AS DIRECTED

## 2018-02-16 ENCOUNTER — Encounter: Payer: Self-pay | Admitting: Family Medicine

## 2018-02-16 ENCOUNTER — Ambulatory Visit (INDEPENDENT_AMBULATORY_CARE_PROVIDER_SITE_OTHER): Payer: Commercial Managed Care - PPO | Admitting: Family Medicine

## 2018-02-16 VITALS — BP 120/90 | HR 74 | Temp 97.8°F | Ht 67.0 in | Wt 162.0 lb

## 2018-02-16 DIAGNOSIS — Z0000 Encounter for general adult medical examination without abnormal findings: Secondary | ICD-10-CM

## 2018-02-16 DIAGNOSIS — Z23 Encounter for immunization: Secondary | ICD-10-CM | POA: Diagnosis not present

## 2018-02-16 NOTE — Addendum Note (Signed)
Addended by: Carter Kitten on: 02/16/2018 03:10 PM   Modules accepted: Orders

## 2018-04-15 ENCOUNTER — Other Ambulatory Visit: Payer: Self-pay | Admitting: Family Medicine

## 2018-04-20 ENCOUNTER — Encounter

## 2018-04-20 ENCOUNTER — Ambulatory Visit: Payer: Commercial Managed Care - PPO | Admitting: Interventional Cardiology

## 2018-04-25 NOTE — Progress Notes (Signed)
Cardiology Office Note    Date:  04/26/2018   ID:  Steven Kemp, DOB June 30, 1965, MRN [id number]  PCP:  Owens Loffler, MD  Cardiologist: Larae Grooms, MD EPS: None  Chief Complaint  Patient presents with  . Follow-up    History of Present Illness:  Steven Kemp is a 52 y.o. male history of CAD status post STEMI 10/22/2016 treated with DES to the first diagonal with residual moderate disease in the LAD, right PDA and distal LAD.  He had preserved LV function.  Also has hypertension, HLD, borderline DM.  Last saw Dr. Irish Lack 06/2017 at which time he was doing well.  He was cleared for surgical vasectomy and was allowed to hold his Brilinta 5 days prior to surgery.  A few months ago while watching TV he had chest pain that he describes as an uncomfortable feeling. NTG helped. Had a similar episode at work that wasn't as bad and NTG helped. Uses a mobile bike under his desk all day but no regular exercise. Stays active playing with kids and no chest tightness. Non smoker.  Full labs were checked in August and all were stable.  LDL was at goal.    Past Medical History:  Diagnosis Date  . CAD (coronary artery disease), native coronary artery    10/22/16 PCI with DES--> 1st diag  . Hyperlipidemia   . Hypertension     Past Surgical History:  Procedure Laterality Date  . CORONARY STENT INTERVENTION N/A 10/22/2016   Procedure: Coronary Stent Intervention;  Surgeon: Jettie Booze, MD;  Location: [facility];  Service: Cardiovascular;  Laterality: N/A;  . LEFT HEART CATH AND CORONARY ANGIOGRAPHY N/A 10/22/2016   Procedure: Left Heart Cath and Coronary Angiography;  Surgeon: Jettie Booze, MD;  Location: [facility];  Service: Cardiovascular;  Laterality: N/A;    Current Medications: Current Meds  Medication Sig  . aspirin 81 MG chewable tablet Chew 1 tablet (81 mg total) by mouth daily.  Marland Kitchen atorvastatin (LIPITOR) 80 MG tablet TAKE 1 TABLET (80 MG  TOTAL) BY MOUTH DAILY AT 6 PM.  . BRILINTA 90 MG TABS tablet TAKE 1 TABLET (90 MG TOTAL) BY MOUTH EVERY 12 (TWELVE) HOURS  . metoprolol tartrate (LOPRESSOR) 25 MG tablet TAKE HALF TABLET BY MOUTH 2 (TWO) TIMES DAILY.  . nitroGLYCERIN (NITROSTAT) 0.4 MG SL tablet Place 1 tablet (0.4 mg total) under the tongue every 5 (five) minutes as needed.  . sildenafil (REVATIO) 20 MG tablet TAKE 2 TO 5 TABLETS (40-100MG ) BY MOUTH 30 MINUTES PRIOR TO INTERCOURSE AS DIRECTED  . [DISCONTINUED] lisinopril (PRINIVIL,ZESTRIL) 10 MG tablet TAKE 1 TABLET BY MOUTH EVERY DAY  . [DISCONTINUED] nitroGLYCERIN (NITROSTAT) 0.4 MG SL tablet PLACE 1 TABLET (0.4 MG TOTAL) UNDER THE TONGUE EVERY 5 (FIVE) MINUTES AS NEEDED.     Allergies:   Patient has no known allergies.   Social History   Socioeconomic History  . Marital status: Married    Spouse name: Not on file  . Number of children: 2  . Years of education: Not on file  . Highest education level: Not on file  Occupational History  . Occupation: [employer]: [employer]    Comment: Data base management  Social Needs  . Financial resource strain: Not on file  . Food insecurity:    Worry: Not on file    Inability: Not on file  . Transportation needs:    Medical: Not on file  Non-medical: Not on file  Tobacco Use  . Smoking status: Never Smoker  . Smokeless tobacco: Never Used  Substance and Sexual Activity  . Alcohol use: Yes    Comment: very rare  . Drug use: No  . Sexual activity: Not on file  Lifestyle  . Physical activity:    Days per week: Not on file    Minutes per session: Not on file  . Stress: Not on file  Relationships  . Social connections:    Talks on phone: Not on file    Gets together: Not on file    Attends religious service: Not on file    Active member of club or organization: Not on file    Attends meetings of clubs or organizations: Not on file    Relationship status: Not on file  Other Topics Concern   . Not on file  Social History Narrative  . Not on file     Family History:  The patient's family history includes Hypertension in his father.   ROS:   Please see the history of present illness.    Review of Systems  Constitution: Negative.  HENT: Negative.   Cardiovascular: Positive for chest pain.  Respiratory: Negative.   Endocrine: Negative.   Hematologic/Lymphatic: Negative.   Musculoskeletal: Negative.   Gastrointestinal: Negative.   Genitourinary: Negative.   Neurological: Negative.    All other systems reviewed and are negative.   PHYSICAL EXAM:   VS:  BP 130/90 (BP Location: Left Arm, Patient Position: Sitting, Cuff Size: Normal)   Pulse 61   Ht 5\' 8"  (1.727 m)   Wt 168 lb (76.2 kg)   SpO2 99%   BMI 25.54 kg/m   Physical Exam  GEN: Well nourished, well developed, in no acute distress  Neck: no JVD, carotid bruits, or masses Cardiac:RRR; positive S4 2/6 systolic murmur the left sternal border Respiratory:  clear to auscultation bilaterally, normal work of breathing GI: soft, nontender, nondistended, + BS Ext: without cyanosis, clubbing, or edema, Good distal pulses bilaterally Neuro:  Alert and Oriented x 3,  Psych: euthymic mood, full affect  Wt Readings from Last 3 Encounters:  04/26/18 168 lb (76.2 kg)  02/16/18 162 lb (73.5 kg)  09/29/17 165 lb 8 oz (75.1 kg)      Studies/Labs Reviewed:   EKG:  EKG is ordered today.  The ekg ordered today demonstrates normal sinus rhythm with old septal infarct, no acute change  Recent Labs: 02/09/2018: ALT 26; BUN 12; Creatinine, Ser 1.02; Hemoglobin 16.4; Platelets 195.0; Potassium 4.5; Sodium 138   Lipid Panel    Component Value Date/Time   CHOL 107 02/09/2018 0743   CHOL 76 (L) 12/14/2016 0732   TRIG 63.0 02/09/2018 0743   HDL 38.60 (L) 02/09/2018 0743   HDL 32 (L) 12/14/2016 0732   CHOLHDL 3 02/09/2018 0743   VLDL 12.6 02/09/2018 0743   LDLCALC 56 02/09/2018 0743   LDLCALC 33 12/14/2016 0732    LDLDIRECT 103.0 06/18/2015 1603    Additional studies/ records that were reviewed today include:   Cath in 10/2016 :  Ost RPDA to RPDA lesion, 80 %stenosed. Bifurcation of PDA /PLA occurs more proximally.  Prox Cx lesion, 50 %stenosed.  Prox LAD to Mid LAD lesion, 25 %stenosed.  Dist LAD lesion, 90 %stenosed. This is at the apex and not amenable to PCI.  1st Diag lesion, 95 %stenosed. A STENT PROMUS PREM MR 2.25X16 drug eluting stent was successfully placed, postdilated to > 2.5 mm.  Post intervention, there is a 0% residual stenosis.  The left ventricular systolic function is normal.  LV end diastolic pressure is normal.  The left ventricular ejection fraction is 50-55% by visual estimate. There is no aortic valve stenosis.      ASSESSMENT:    1. Coronary artery disease involving native coronary artery of native heart without angina pectoris   2. Essential hypertension   3. Hyperlipidemia LDL goal <70   4. Borderline diabetes   5. Chest pain, unspecified type      PLAN:  In order of problems listed above:  CAD status post STEMI 10/22/2016 treated with DES to the first diagonal with residual moderate disease in the LAD, right PDA and distal LAD with normal LV function.  Treated medically on Brilinta and aspirin.  Has had 2 episodes of chest pain at rest relieved with nitroglycerin.  Pain is different from his MI pain but still worrisome to him.  With residual blockages will proceed with exercise Myoview to rule out ischemia.  Follow-up with Dr. Irish Lack in 6 months or sooner pending results of stress test.  Essential hypertension on lisinopril and metoprolol.  Blood pressure elevated today.  Will increase lisinopril to 20 mg daily.  Hyperlipidemia on Lipitor LDL 56 in August  Borderline diabetes hemoglobin A1c 6.0    Medication Adjustments/Labs and Tests Ordered: Current medicines are reviewed at length with the patient today.  Concerns regarding medicines are  outlined above.  Medication changes, Labs and Tests ordered today are listed in the Patient Instructions below. There are no Patient Instructions on file for this visit.   Sumner Boast, PA-C  04/26/2018 8:31 AM    [facility] [place], [place], [place]  [address] Phone: [phone]; Fax: [phone]

## 2018-04-26 ENCOUNTER — Ambulatory Visit: Payer: Commercial Managed Care - PPO | Admitting: Physician Assistant

## 2018-04-26 ENCOUNTER — Encounter: Payer: Self-pay | Admitting: Physician Assistant

## 2018-04-26 VITALS — BP 130/90 | HR 61 | Ht 68.0 in | Wt 168.0 lb

## 2018-04-26 DIAGNOSIS — I2510 Atherosclerotic heart disease of native coronary artery without angina pectoris: Secondary | ICD-10-CM

## 2018-04-26 DIAGNOSIS — R7303 Prediabetes: Secondary | ICD-10-CM

## 2018-04-26 DIAGNOSIS — R079 Chest pain, unspecified: Secondary | ICD-10-CM

## 2018-04-26 DIAGNOSIS — I10 Essential (primary) hypertension: Secondary | ICD-10-CM | POA: Diagnosis not present

## 2018-04-26 DIAGNOSIS — E785 Hyperlipidemia, unspecified: Secondary | ICD-10-CM | POA: Diagnosis not present

## 2018-04-26 MED ORDER — LISINOPRIL 20 MG PO TABS: 20.0000 mg | ORAL_TABLET | Freq: Every day | ORAL | 3 refills | Status: DC

## 2018-04-26 MED ORDER — NITROGLYCERIN 0.4 MG SL SUBL: 0.4000 mg | SUBLINGUAL_TABLET | SUBLINGUAL | 0 refills | Status: DC | PRN

## 2018-04-26 NOTE — Patient Instructions (Signed)
Medication Instructions:  Your physician has recommended you make the following change in your medication:   INCREASE: lisinopril to 20 mg once daily   If you need a refill on your cardiac medications before your next appointment, please call your pharmacy.   Lab work: None Ordered  If you have labs (blood work) drawn today and your tests are completely normal, you will receive your results only by: Marland Kitchen MyChart Message (if you have MyChart) OR . A paper copy in the mail If you have any lab test that is abnormal or we need to change your treatment, we will call you to review the results.  Testing/Procedures: Your physician has requested that you have en exercise stress myoview. For further information please visit [email or web address]. Please follow instruction sheet, as given.  Follow-Up: At [facility], you and your health needs are our priority.  As part of our continuing mission to provide you with exceptional heart care, we have created designated Provider Care Teams.  These Care Teams include your primary Cardiologist (physician) and Advanced Practice Providers (APPs -  Physician Assistants and Nurse Practitioners) who all work together to provide you with the care you need, when you need it. . You will need a follow up appointment in 6 months.  Please call our office 2 months in advance to schedule this appointment.  You may see Casandra Doffing, MD or one of the following Advanced Practice Providers on your designated Care Team:   . Lyda Jester, PA-C . Dayna Dunn, PA-C . Ermalinda Barrios, PA-C  Any Other Special Instructions Will Be Listed Below (If Applicable).

## 2018-05-02 ENCOUNTER — Telehealth (HOSPITAL_COMMUNITY): Payer: Self-pay | Admitting: *Deleted

## 2018-05-02 NOTE — Telephone Encounter (Signed)
Left message on voicemail per DPR in reference to upcoming appointment scheduled on 05/06/18 with detailed instructions given per Myocardial Perfusion Study Information Sheet for the test. LM to arrive 15 minutes early, and that it is imperative to arrive on time for appointment to keep from having the test rescheduled. If you need to cancel or reschedule your appointment, please call the office within 24 hours of your appointment. Failure to do so may result in a cancellation of your appointment, and a $50 no show fee. Phone number given for call back for any questions. [name]

## 2018-05-03 ENCOUNTER — Encounter (HOSPITAL_COMMUNITY): Payer: Commercial Managed Care - PPO

## 2018-05-06 ENCOUNTER — Encounter (HOSPITAL_COMMUNITY): Payer: Commercial Managed Care - PPO

## 2018-05-23 ENCOUNTER — Telehealth (HOSPITAL_COMMUNITY): Payer: Self-pay | Admitting: *Deleted

## 2018-05-23 NOTE — Telephone Encounter (Signed)
Left message on voicemail per DPR in reference to upcoming appointment scheduled on 05/27/18 with detailed instructions given per Myocardial Perfusion Study Information Sheet for the test. LM to arrive 15 minutes early, and that it is imperative to arrive on time for appointment to keep from having the test rescheduled. If you need to cancel or reschedule your appointment, please call the office within 24 hours of your appointment. Failure to do so may result in a cancellation of your appointment, and a $50 no show fee. Phone number given for call back for any questions. [name]

## 2018-05-27 ENCOUNTER — Ambulatory Visit (HOSPITAL_COMMUNITY): Payer: Commercial Managed Care - PPO | Attending: Physician Assistant

## 2018-05-27 DIAGNOSIS — R079 Chest pain, unspecified: Secondary | ICD-10-CM | POA: Diagnosis not present

## 2018-05-27 LAB — MYOCARDIAL PERFUSION IMAGING
CHL CUP MPHR: 168 {beats}/min
CHL CUP NUCLEAR SDS: 2
Estimated workload: 11.7 METS
Exercise duration (min): 10 min
Exercise duration (sec): 0 s
LV sys vol: 28 mL
LVDIAVOL: 70 mL (ref 62–150)
NUC STRESS TID: 0.79
Peak HR: 169 {beats}/min
Percent HR: 100 %
Rest HR: 62 {beats}/min
SRS: 0
SSS: 2

## 2018-05-27 MED ORDER — TECHNETIUM TC 99M TETROFOSMIN IV KIT
10.6000 | PACK | Freq: Once | INTRAVENOUS | Status: AC | PRN
  Administered 2018-05-27: 10.6 via INTRAVENOUS
  Filled 2018-05-27: qty 11

## 2018-05-27 MED ORDER — TECHNETIUM TC 99M TETROFOSMIN IV KIT
31.2000 | PACK | Freq: Once | INTRAVENOUS | Status: AC | PRN
  Administered 2018-05-27: 31.2 via INTRAVENOUS
  Filled 2018-05-27: qty 32

## 2018-07-16 ENCOUNTER — Other Ambulatory Visit: Payer: Self-pay | Admitting: Interventional Cardiology

## 2018-08-03 ENCOUNTER — Other Ambulatory Visit: Payer: Self-pay | Admitting: Interventional Cardiology

## 2018-08-12 ENCOUNTER — Other Ambulatory Visit: Payer: Self-pay | Admitting: Interventional Cardiology

## 2018-08-15 ENCOUNTER — Ambulatory Visit: Payer: Commercial Managed Care - PPO | Admitting: Family Medicine

## 2018-08-15 ENCOUNTER — Encounter: Payer: Self-pay | Admitting: Family Medicine

## 2018-08-15 VITALS — BP 142/82 | HR 82 | Temp 97.7°F | Resp 16 | Ht 68.0 in | Wt 175.5 lb

## 2018-08-15 DIAGNOSIS — S83005A Unspecified dislocation of left patella, initial encounter: Secondary | ICD-10-CM | POA: Diagnosis not present

## 2018-08-15 NOTE — Patient Instructions (Signed)
Good to see you today  Wear brace during the day, remove at night and several times throughout the day and do gentle range of motion exercises  Out of work until evaluated by Dr. Lorelei Pont in 2 weeks  Follow up if any worsening pain, swelling in the meantime

## 2018-08-15 NOTE — Progress Notes (Signed)
   Subjective:    Patient ID: Steven Kemp, male    DOB: 1965-10-26, 52 y.o.   MRN: [id number]  HPI This is a 53 yo male who presents today with left knee pain x 2 days. He was on his abdomen under the bed trying to get his cat. He had severe pain and saw his kneecap displaced to the left. Currently with pain with certain movements. Avoiding too much weight bearing. No meds or ice/heat.   Past Medical History:  Diagnosis Date  . CAD (coronary artery disease), native coronary artery    10/22/16 PCI with DES--> 1st diag  . Hyperlipidemia   . Hypertension    Past Surgical History:  Procedure Laterality Date  . CORONARY STENT INTERVENTION N/A 10/22/2016   Procedure: Coronary Stent Intervention;  Surgeon: Jettie Booze, MD;  Location: [place] [facility];  Service: Cardiovascular;  Laterality: N/A;  . LEFT HEART CATH AND CORONARY ANGIOGRAPHY N/A 10/22/2016   Procedure: Left Heart Cath and Coronary Angiography;  Surgeon: Jettie Booze, MD;  Location: [place] [facility];  Service: Cardiovascular;  Laterality: N/A;   Family History  Problem Relation Age of Onset  . Hypertension Father    Social History   Tobacco Use  . Smoking status: Never Smoker  . Smokeless tobacco: Never Used  Substance Use Topics  . Alcohol use: Yes    Comment: very rare  . Drug use: No      Review of Systems Per HPI    Objective:   Physical Exam Vitals signs reviewed.  Constitutional:      General: He is not in acute distress.    Appearance: Normal appearance. He is not ill-appearing or toxic-appearing.  Cardiovascular:     Rate and Rhythm: Normal rate.  Pulmonary:     Effort: Pulmonary effort is normal.  Musculoskeletal:     Left knee: He exhibits decreased range of motion, swelling and effusion. He exhibits no erythema, normal alignment, no LCL laxity, normal patellar mobility and no MCL laxity. Tenderness found. Lateral joint line tenderness noted.  Skin:    General: Skin is warm and  dry.  Neurological:     Mental Status: He is alert and oriented to person, place, and time.  Psychiatric:        Mood and Affect: Mood normal.        Behavior: Behavior normal.        Thought Content: Thought content normal.        Judgment: Judgment normal.       BP (!) 142/82 (BP Location: Left Arm, Patient Position: Sitting)   Pulse 82   Temp 97.7 F (36.5 C) (Oral)   Resp 16   Ht 5\' 8"  (1.727 m)   Wt 175 lb 8 oz (79.6 kg)   SpO2 97%   BMI 26.68 kg/m  Wt Readings from Last 3 Encounters:  08/15/18 175 lb 8 oz (79.6 kg)  05/27/18 168 lb (76.2 kg)  04/26/18 168 lb (76.2 kg)       Assessment & Plan:  1. Patellar dislocation, left, initial encounter - discussed with Dr. Lorelei Pont who also examined the patient - fitted for knee brace and instructed on use - out of work for 2 weeks until follow up with Dr. Lorelei Pont - RTC if any worsening of symptoms   Clarene Reamer, FNP-BC  [facility], [place]  08/15/2018 2:36 PM

## 2018-08-24 ENCOUNTER — Encounter: Payer: Self-pay | Admitting: Family Medicine

## 2018-08-24 ENCOUNTER — Ambulatory Visit: Payer: Commercial Managed Care - PPO | Admitting: Family Medicine

## 2018-08-24 VITALS — BP 102/70 | HR 73 | Temp 97.6°F | Ht 68.0 in | Wt 173.2 lb

## 2018-08-24 DIAGNOSIS — S83005D Unspecified dislocation of left patella, subsequent encounter: Secondary | ICD-10-CM | POA: Diagnosis not present

## 2018-08-24 NOTE — Progress Notes (Signed)
Dr. Frederico Hamman T. Kacy Conely, MD, [facility] [place], [address] Phone: [phone] Fax: [phone]  08/24/2018  Patient: Steven Kemp, MRN: [id number], DOB: 1966-02-19, 53 y.o.  Primary Physician:  Owens Loffler, MD   Chief Complaint  Patient presents with  . Follow-up    Left knee   Subjective:   Steven Kemp is a 53 y.o. very pleasant male patient who presents with the following:  S/p L patellar dislocation:  Doing really well.   He is feeling a lot better, and he still does have some persistent effusion, but his pain is much better and he is walking easily without any type of support.  He has been doing some basic range of motion and quad rehab.  Past Medical History, Surgical History, Social History, Family History, Problem List, Medications, and Allergies have been reviewed and updated if relevant.  Patient Active Problem List   Diagnosis Date Noted  . CAD (coronary artery disease) 12/28/2016  . Acute MI, lateral wall ([place])   . Borderline diabetes 09/08/2013  . BPPV (benign paroxysmal positional vertigo) 02/10/2013  . Hyperlipidemia LDL goal <70 10/09/2009  . Essential hypertension 10/09/2009  . EXTERNAL HEMORRHOIDS WITHOUT MENTION COMP 07/24/2009  . ALLERGIC  RHINITIS 02/03/2007    Past Medical History:  Diagnosis Date  . CAD (coronary artery disease), native coronary artery    10/22/16 PCI with DES--> 1st diag  . Hyperlipidemia   . Hypertension     Past Surgical History:  Procedure Laterality Date  . CORONARY STENT INTERVENTION N/A 10/22/2016   Procedure: Coronary Stent Intervention;  Surgeon: Jettie Booze, MD;  Location: [place] [facility];  Service: Cardiovascular;  Laterality: N/A;  . LEFT HEART CATH AND CORONARY ANGIOGRAPHY N/A 10/22/2016   Procedure: Left Heart Cath and Coronary Angiography;  Surgeon: Jettie Booze, MD;  Location: [place] [facility];  Service:  Cardiovascular;  Laterality: N/A;    Social History   Socioeconomic History  . Marital status: Married    Spouse name: Not on file  . Number of children: 2  . Years of education: Not on file  . Highest education level: Not on file  Occupational History  . Occupation: [employer]    Comment: Data base management  Social Needs  . Financial resource strain: Not on file  . Food insecurity:    Worry: Not on file    Inability: Not on file  . Transportation needs:    Medical: Not on file    Non-medical: Not on file  Tobacco Use  . Smoking status: Never Smoker  . Smokeless tobacco: Never Used  Substance and Sexual Activity  . Alcohol use: Yes    Comment: very rare  . Drug use: No  . Sexual activity: Not on file  Lifestyle  . Physical activity:    Days per week: Not on file    Minutes per session: Not on file  . Stress: Not on file  Relationships  . Social connections:    Talks on phone: Not on file    Gets together: Not on file    Attends religious service: Not on file    Active member of club or organization: Not on file    Attends meetings of clubs or organizations: Not on file    Relationship status: Not on file  . Intimate partner violence:    Fear of current or ex partner:  Not on file    Emotionally abused: Not on file    Physically abused: Not on file    Forced sexual activity: Not on file  Other Topics Concern  . Not on file  Social History Narrative  . Not on file    Family History  Problem Relation Age of Onset  . Hypertension Father     No Known Allergies  Medication list reviewed and updated in full in [place].  GEN: No fevers, chills. Nontoxic. Primarily MSK c/o today. MSK: Detailed in the HPI GI: tolerating PO intake without difficulty Neuro: No numbness, parasthesias, or tingling associated. Otherwise the pertinent positives of the ROS are noted above.   Objective:   BP 102/70   Pulse 73    Temp 97.6 F (36.4 C) (Oral)   Ht 5\' 8"  (1.727 m)   Wt 173 lb 4 oz (78.6 kg)   BMI 26.34 kg/m    GEN: WDWN, NAD, Non-toxic, Alert & Oriented x 3 HEENT: Atraumatic, Normocephalic.  Ears and Nose: No external deformity. EXTR: No clubbing/cyanosis/edema NEURO: Normal gait.  PSYCH: Normally interactive. Conversant. Not depressed or anxious appearing.  Calm demeanor.    Left knee, mild effusion.  There is no patellar apprehension or pain with manipulation of the patella.  Stable to varus and valgus stress.  Lockman is negative.  No pain with flexion pinch or McMurray's.  Radiology: No results found.  Assessment and Plan:   Patellar dislocation, left, subsequent encounter  Doing very well, advanced activity as tolerated.  Wear knee brace for more aggressive activities.  I think that he is doing really quite well, and I do not think he needs any additional therapy other than home rehab.  Okay to return to work on Monday.  Follow-up: No follow-ups on file.  Signed,  [name]. Bastion Bolger, MD   Outpatient Encounter Medications as of 08/24/2018  Medication Sig  . aspirin 81 MG chewable tablet Chew 1 tablet (81 mg total) by mouth daily.  Marland Kitchen atorvastatin (LIPITOR) 80 MG tablet TAKE 1 TABLET (80 MG TOTAL) BY MOUTH DAILY AT 6 PM.  . BRILINTA 90 MG TABS tablet TAKE 1 TABLET (90 MG TOTAL) BY MOUTH EVERY 12 (TWELVE) HOURS  . lisinopril (PRINIVIL,ZESTRIL) 20 MG tablet Take 1 tablet (20 mg total) by mouth daily.  . metoprolol tartrate (LOPRESSOR) 25 MG tablet TAKE HALF TABLET BY MOUTH 2 (TWO) TIMES DAILY.  . nitroGLYCERIN (NITROSTAT) 0.4 MG SL tablet Place 1 tablet (0.4 mg total) under the tongue every 5 (five) minutes as needed.  . sildenafil (REVATIO) 20 MG tablet TAKE 2 TO 5 TABLETS (40-100MG ) BY MOUTH 30 MINUTES PRIOR TO INTERCOURSE AS DIRECTED   No facility-administered encounter medications on file as of 08/24/2018.

## 2018-11-03 ENCOUNTER — Other Ambulatory Visit: Payer: Self-pay | Admitting: Physician Assistant

## 2018-11-07 ENCOUNTER — Telehealth: Payer: Self-pay | Admitting: *Deleted

## 2018-11-07 ENCOUNTER — Encounter: Payer: Self-pay | Admitting: Physician Assistant

## 2018-11-07 NOTE — Telephone Encounter (Signed)

## 2018-11-07 NOTE — Progress Notes (Addendum)
Virtual Visit via Video Note   This visit type was conducted due to national recommendations for restrictions regarding the COVID-19 Pandemic (e.g. social distancing) in an effort to limit this patient's exposure and mitigate transmission in our community.  Due to his co-morbid illnesses, this patient is at least at moderate risk for complications without adequate follow up.  This format is felt to be most appropriate for this patient at this time.  All issues noted in this document were discussed and addressed.  A limited physical exam was performed with this format.  Please refer to the patient's chart for his consent to telehealth for [facility].   Date:  11/08/2018   ID:  Dara Lords, DOB [birth year], MRN [id number]  Patient Location: Home  Provider Location: Home  PCP:  Owens Loffler, MD  Cardiologist:  Larae Grooms, MD  Electrophysiologist:  None   Evaluation Performed:  Follow-Up Visit  Chief Complaint:  F/u CAD  History of Present Illness:    Delbert Darley is a 53 y.o. male with CAD (STEMI 10/22/2016 s/p DES to the first diagonal with residual moderate disease in the LAD, right PDA and distal LAD), HTN, HLD, borderline DM who presents for routine follow-up. At time of MI he had preserved LV function.  Last labs 01/2018 showed LDL 56, normal LFTs, K 4.5, Cr 1.02, normal CBC. He had a stress test 05/2018 for some recurrent chest discomfort that was normal with excellent exercise capacity, EF 60%.  He returns for follow-up today virtually feeling great. He denies recurrent angina or any SOB, palpitations, syncope, or bleeding. He is tolerating all meds without difficulty. He remains physically active in the yard. He was fixing lunch during the visit, including some greens he had grown in his own yard. The patient does not have symptoms concerning for COVID-19 infection (fever, chills, cough, or new shortness of breath).    Past Medical History:  Diagnosis Date   . Borderline diabetes   . CAD (coronary artery disease), native coronary artery    a. STEMI 10/22/2016 s/p DES to the first diagonal with residual moderate disease in the LAD, right PDA and distal LAD.  Marland Kitchen Hyperlipidemia   . Hypertension    Past Surgical History:  Procedure Laterality Date  . CORONARY STENT INTERVENTION N/A 10/22/2016   Procedure: Coronary Stent Intervention;  Surgeon: Jettie Booze, MD;  Location: [place] [facility];  Service: Cardiovascular;  Laterality: N/A;  . LEFT HEART CATH AND CORONARY ANGIOGRAPHY N/A 10/22/2016   Procedure: Left Heart Cath and Coronary Angiography;  Surgeon: Jettie Booze, MD;  Location: [place] [facility];  Service: Cardiovascular;  Laterality: N/A;     Current Meds  Medication Sig  . aspirin 81 MG chewable tablet Chew 1 tablet (81 mg total) by mouth daily.  Marland Kitchen atorvastatin (LIPITOR) 80 MG tablet TAKE 1 TABLET (80 MG TOTAL) BY MOUTH DAILY AT 6 PM.  . BRILINTA 90 MG TABS tablet TAKE 1 TABLET (90 MG TOTAL) BY MOUTH EVERY 12 (TWELVE) HOURS  . lisinopril (PRINIVIL,ZESTRIL) 20 MG tablet Take 1 tablet (20 mg total) by mouth daily.  . metoprolol tartrate (LOPRESSOR) 25 MG tablet TAKE HALF TABLET BY MOUTH 2 (TWO) TIMES DAILY.  . nitroGLYCERIN (NITROSTAT) 0.4 MG SL tablet Place 1 tablet (0.4 mg total) under the tongue every 5 (five) minutes as needed.  . sildenafil (REVATIO) 20 MG tablet TAKE 2 TO 5 TABLETS (40-100MG ) BY MOUTH 30 MINUTES PRIOR TO INTERCOURSE AS DIRECTED  Allergies:   Patient has no known allergies.   Social History   Tobacco Use  . Smoking status: Never Smoker  . Smokeless tobacco: Never Used  Substance Use Topics  . Alcohol use: Yes    Comment: very rare  . Drug use: No     Family Hx: The patient's family history includes Hypertension in his father.  ROS:   Please see the history of present illness.    All other systems reviewed and are negative.   Prior CV studies:   The following studies were reviewed today:   Most recent pertinent cardiac studies are outlined above. Otherwise cath 10/22/16 - Conclusion     Ost RPDA to RPDA lesion, 80 %stenosed. Bifurcation of PDA /PLA occurs more proximally.  Prox Cx lesion, 50 %stenosed.  Prox LAD to Mid LAD lesion, 25 %stenosed.  Dist LAD lesion, 90 %stenosed. This is at the apex and not amenable to PCI.  1st Diag lesion, 95 %stenosed. A STENT PROMUS PREM MR 2.25X16 drug eluting stent was successfully placed, postdilated to > 2.5 mm.  Post intervention, there is a 0% residual stenosis.  The left ventricular systolic function is normal.  LV end diastolic pressure is normal.  The left ventricular ejection fraction is 50-55% by visual estimate.  There is no aortic valve stenosis.     Labs/Other Tests and Data Reviewed:    EKG:  An ECG dated 04/26/18 was personally reviewed today and demonstrated:  NSR 61bpm prior septal infarct, nonspecific TW changes  Recent Labs: 02/09/2018: ALT 26; BUN 12; Creatinine, Ser 1.02; Hemoglobin 16.4; Platelets 195.0; Potassium 4.5; Sodium 138   Recent Lipid Panel Lab Results  Component Value Date/Time   CHOL 107 02/09/2018 07:43 AM   CHOL 76 (L) 12/14/2016 07:32 AM   TRIG 63.0 02/09/2018 07:43 AM   HDL 38.60 (L) 02/09/2018 07:43 AM   HDL 32 (L) 12/14/2016 07:32 AM   CHOLHDL 3 02/09/2018 07:43 AM   LDLCALC 56 02/09/2018 07:43 AM   LDLCALC 33 12/14/2016 07:32 AM   LDLDIRECT 103.0 06/18/2015 04:03 PM    Wt Readings from Last 3 Encounters:  11/08/18 170 lb (77.1 kg)  08/24/18 173 lb 4 oz (78.6 kg)  08/15/18 175 lb 8 oz (79.6 kg)     Objective:    Vital Signs:  Ht 5\' 8"  (1.727 m)   Wt 170 lb (77.1 kg)   BMI 25.85 kg/m    VITAL SIGNS:  reviewed  General - alert, energetic M in no acute distress HEENT - NCAT, EOM intact Pulm - No labored breathing, no coughing during visit, no audible wheezing, speaking in full sentences Neuro - A+Ox3, no slurred speech, answers questions appropriately Psych -  Pleasant affect MSK- moving about freely while on the call    ASSESSMENT & PLAN:    1. CAD - doing very well without angina. Continue present regimen. I will reach out to Dr. Irish Lack to discuss desired duration of Brilinta. DAPT for at least 1 year was previously recommended. He feels good on this regimen and has no issues with continuing if that is what is recommended. Also discussed NTG/sildenafil precautions. Addendum: Per discussion with Dr. Irish Lack, "I would change him to clopidogrel monotherapy given that he has a fair amount of diffuse CAD remaining." See phone note. 2. Essential HTN - unknown BP, but last PCP OV's ranging 102/70-142/82. We discussed BP cuff recommendations. He will obtain one for use and will call/MyChart with a reading when he gets one. 3. Hyperlipidemia -  lipids looked good last August. Plan fasting labs 01/2019 - routine surveillance including CBC, CMET, lipid profile. 4. Borderline DM - he states this has improved/resolved with lifestyle changes.  COVID-19 Education: The signs and symptoms of COVID-19 were discussed with the patient and how to seek care for testing (follow up with PCP or arrange E-visit).  The importance of social distancing was discussed today.  Time:   Today, I have spent 17 minutes with the patient with telehealth technology discussing the above problems.     Medication Adjustments/Labs and Tests Ordered: Current medicines are reviewed at length with the patient today.  Concerns regarding medicines are outlined above.   Disposition:  Follow up 6 months with Dr. Irish Lack  Signed, Charlie Pitter, PA-C  11/08/2018 2:27 PM    [place]

## 2018-11-08 ENCOUNTER — Telehealth (INDEPENDENT_AMBULATORY_CARE_PROVIDER_SITE_OTHER): Payer: Commercial Managed Care - PPO | Admitting: Physician Assistant

## 2018-11-08 ENCOUNTER — Encounter: Payer: Self-pay | Admitting: Physician Assistant

## 2018-11-08 ENCOUNTER — Telehealth: Payer: Self-pay | Admitting: Physician Assistant

## 2018-11-08 ENCOUNTER — Other Ambulatory Visit: Payer: Self-pay

## 2018-11-08 VITALS — Ht 68.0 in | Wt 170.0 lb

## 2018-11-08 DIAGNOSIS — I10 Essential (primary) hypertension: Secondary | ICD-10-CM

## 2018-11-08 DIAGNOSIS — R7303 Prediabetes: Secondary | ICD-10-CM

## 2018-11-08 DIAGNOSIS — E785 Hyperlipidemia, unspecified: Secondary | ICD-10-CM

## 2018-11-08 DIAGNOSIS — I2510 Atherosclerotic heart disease of native coronary artery without angina pectoris: Secondary | ICD-10-CM | POA: Diagnosis not present

## 2018-11-08 NOTE — Telephone Encounter (Signed)
This is one of the first times I've ever had a patient request to stay on these instead of simplifying regimen, so I will route to Dr. Irish Lack for input on continuing! Dayna Dunn PA-C

## 2018-11-08 NOTE — Telephone Encounter (Signed)
Call placed to pt regarding switching his Brilinta and Aspirin to Plavix. Pt wants to know why does he need to change?  He states that he likes the [place] and he would prefer to keep it the way it is if he can. Advised pt I would send this information back to [facility], PA-C, and will get back with him.  Pt verbalized understanding.

## 2018-11-08 NOTE — Telephone Encounter (Signed)
   Please call patient. Let him know I talked to Dr. Irish Lack about whether to continue his aspirin and Brilinta. Now that he is greater than a year out from stenting, Dr. Irish Lack would like to stop both and put him on clopidogrel (75mg  once daily) as a long-term blood thinner. When he picks up the clopidogrel, he would take last dose of Brilinta that evening, then start clopidogrel the following day (with no further aspirin/Brilinta).   Clopidogrel is similar to aspirin but is a little bit more potent  - it is a similar medicine to Brilinta. Please prescribe. Please let him know that meds like Nexium and Prilosec can interact with Plavix so if he has any issues with acid reflux in the future, can take Protonix, Dexliant, Prevacid, or Pepcid - but not Prilosec (omeprazole) or Nexium (esomeprazole). Effie Janoski PA-C

## 2018-11-08 NOTE — Patient Instructions (Addendum)
Medication Instructions:  Your physician recommends that you continue on your current medications as directed. Please refer to the Current Medication list given to you today.  If you need a refill on your cardiac medications before your next appointment, please call your pharmacy.   Lab work: 02/06/2019 AT 8:30: COME TO THE OFFICE FOR LAB WORK,MAKE SURE YOU ARE FASTING, NOTHING TO EAT OR DRINK AFTER MIDNIGHT THE NIGHT BEFORE FOR CMET, CBC, AND LIPID PANEL  If you have labs (blood work) drawn today and your tests are completely normal, you will receive your results only by: Marland Kitchen MyChart Message (if you have MyChart) OR . A paper copy in the mail If you have any lab test that is abnormal or we need to change your treatment, we will call you to review the results.  Testing/Procedures: None ordered  Follow-Up: At [facility], you and your health needs are our priority.  As part of our continuing mission to provide you with exceptional heart care, we have created designated Provider Care Teams.  These Care Teams include your primary Cardiologist (physician) and Advanced Practice Providers (APPs -  Physician Assistants and Nurse Practitioners) who all work together to provide you with the care you need, when you need it. You will need a follow up appointment in 6 months.  Please call our office 2 months in advance to schedule this appointment.  You may see Larae Grooms, MD or one of the following Advanced Practice Providers on your designated Care Team:   [place], PA-C Melina Copa, PA-C . Ermalinda Barrios, PA-C  Any Other Special Instructions Will Be Listed Below (If Applicable). Please get a blood pressure cuff that goes on your arm. If possible, try to select one that also reports your heart rate. To check your blood pressure, choose a time about 3 hours after taking your blood pressure medicines. Remain seated in a chair for 5 minutes quietly beforehand, then check it. When you get a  cuff, please get those readings and call us/send in MyChart message with them for our review.   It is noted that you have nitroglycerin and sildenafil listed on your medicine list. If you take these two medicines can cause low blood pressure if taken together. Do not take nitroglycerin if you have taken sildenafil in the last 48 hours. The opposite is true as well. Do not take sildenafil if you have taken nitroglycerin in the last 48 hours.

## 2018-11-11 NOTE — Telephone Encounter (Signed)
OK to keep Brilinta.  WOuld stop aspirin.

## 2018-11-11 NOTE — Telephone Encounter (Signed)
Thank you :)

## 2018-11-11 NOTE — Telephone Encounter (Signed)
Called and made patient aware okay to continue brilinta. Instructed patient to stop ASA. Patient verbalized understanding and thanked me for the call.

## 2019-01-19 ENCOUNTER — Other Ambulatory Visit: Payer: Self-pay | Admitting: Interventional Cardiology

## 2019-01-19 ENCOUNTER — Ambulatory Visit: Payer: Commercial Managed Care - PPO | Admitting: Family Medicine

## 2019-01-19 ENCOUNTER — Encounter: Payer: Self-pay | Admitting: Family Medicine

## 2019-01-19 ENCOUNTER — Other Ambulatory Visit: Payer: Self-pay

## 2019-01-19 VITALS — BP 130/90 | HR 67 | Temp 97.9°F | Ht 68.0 in | Wt 175.0 lb

## 2019-01-19 DIAGNOSIS — Z0000 Encounter for general adult medical examination without abnormal findings: Secondary | ICD-10-CM

## 2019-01-19 DIAGNOSIS — Z1211 Encounter for screening for malignant neoplasm of colon: Secondary | ICD-10-CM | POA: Diagnosis not present

## 2019-01-19 LAB — BASIC METABOLIC PANEL
BUN: 17 mg/dL (ref 6–23)
CO2: 30 mEq/L (ref 19–32)
Calcium: 9.6 mg/dL (ref 8.4–10.5)
Chloride: 101 mEq/L (ref 96–112)
Creatinine, Ser: 1.03 mg/dL (ref 0.40–1.50)
GFR: 91.48 mL/min (ref >60.00)
Glucose, Bld: 102 mg/dL — ABNORMAL HIGH (ref 70–99)
Potassium: 4.6 mEq/L (ref 3.5–5.1)
Sodium: 138 mEq/L (ref 135–145)

## 2019-01-19 LAB — LIPID PANEL
Cholesterol: 118 mg/dL (ref 0–200)
HDL: 41.2 mg/dL (ref >39.00)
LDL Cholesterol: 65 mg/dL (ref 0–99)
NonHDL: 76.58
Total CHOL/HDL Ratio: 3
Triglycerides: 58 mg/dL (ref 0.0–149.0)
VLDL: 11.6 mg/dL (ref 0.0–40.0)

## 2019-01-19 LAB — CBC WITH DIFFERENTIAL/PLATELET
Basophils Absolute: 0 10*3/uL (ref 0.0–0.1)
Basophils Relative: 0.2 % (ref 0.0–3.0)
Eosinophils Absolute: 0.1 10*3/uL (ref 0.0–0.7)
Eosinophils Relative: 1.9 % (ref 0.0–5.0)
HCT: 48.9 % (ref 39.0–52.0)
Hemoglobin: 16.6 g/dL (ref 13.0–17.0)
Lymphocytes Relative: 24.7 % (ref 12.0–46.0)
Lymphs Abs: 1.6 10*3/uL (ref 0.7–4.0)
MCHC: 34 g/dL (ref 30.0–36.0)
MCV: 88.2 fl (ref 78.0–100.0)
Monocytes Absolute: 0.5 10*3/uL (ref 0.1–1.0)
Monocytes Relative: 8.1 % (ref 3.0–12.0)
Neutro Abs: 4.1 10*3/uL (ref 1.4–7.7)
Neutrophils Relative %: 65.1 % (ref 43.0–77.0)
Platelets: 182 10*3/uL (ref 150.0–400.0)
RBC: 5.55 Mil/uL (ref 4.22–5.81)
RDW: 12.6 % (ref 11.5–15.5)
WBC: 6.4 10*3/uL (ref 4.0–10.5)

## 2019-01-19 LAB — HEPATIC FUNCTION PANEL
ALT: 27 U/L (ref 0–53)
AST: 21 U/L (ref 0–37)
Albumin: 4.6 g/dL (ref 3.5–5.2)
Alkaline Phosphatase: 73 U/L (ref 39–117)
Bilirubin, Direct: 0.1 mg/dL (ref 0.0–0.3)
Total Bilirubin: 0.7 mg/dL (ref 0.2–1.2)
Total Protein: 7.5 g/dL (ref 6.0–8.3)

## 2019-01-19 LAB — HEMOGLOBIN A1C: Hgb A1c MFr Bld: 6.1 % (ref 4.6–6.5)

## 2019-01-19 NOTE — Progress Notes (Signed)
Steven Kemp T. [name], MD [facility] and [place] at [facility] [name], [address] Phone: [phone]  FAX: [phone]  Steven Kemp - 53 y.o. male  MRN [id number]  Date of Birth: 13-Sep-1965  Visit Date: 01/19/2019  PCP: Owens Loffler, MD  Referred by: Owens Loffler, MD  Chief Complaint  Patient presents with  . Arm Pain   Patient Care Team: Owens Loffler, MD as PCP - General Jettie Booze, MD as PCP - Cardiology (Cardiology) Subjective:   Steven Kemp is a 53 y.o. pleasant patient who presents with the following:  Preventative Health Maintenance Visit:  Health Maintenance Summary Reviewed and updated, unless pt declines services.  Tobacco History Reviewed. Alcohol: No concerns, no excessive use Exercise Habits: Some activity, rec at least 30 mins 5 times a week STD concerns: no risk or activity to increase risk Drug Use: None Encouraged self-testicular check  Has some R sided LE. No incident.    Health Maintenance  Topic Date Due  . COLONOSCOPY  04/05/2016  . INFLUENZA VACCINE  01/21/2019  . TETANUS/TDAP  02/17/2028  . HIV Screening  Completed   Immunization History  Administered Date(s) Administered  . Tdap 02/16/2018   Patient Active Problem List   Diagnosis Date Noted  . CAD (coronary artery disease) 12/28/2016  . Acute MI, lateral wall ([place])   . Borderline diabetes 09/08/2013  . BPPV (benign paroxysmal positional vertigo) 02/10/2013  . Hyperlipidemia LDL goal <70 10/09/2009  . Essential hypertension 10/09/2009  . EXTERNAL HEMORRHOIDS WITHOUT MENTION COMP 07/24/2009  . ALLERGIC  RHINITIS 02/03/2007   Past Medical History:  Diagnosis Date  . Borderline diabetes   . CAD (coronary artery disease), native coronary artery    a. STEMI 10/22/2016 s/p DES to the first diagonal with residual moderate disease in the LAD, right PDA and distal LAD.  Marland Kitchen Hyperlipidemia    . Hypertension    Past Surgical History:  Procedure Laterality Date  . CORONARY STENT INTERVENTION N/A 10/22/2016   Procedure: Coronary Stent Intervention;  Surgeon: Jettie Booze, MD;  Location: [place] [facility];  Service: Cardiovascular;  Laterality: N/A;  . LEFT HEART CATH AND CORONARY ANGIOGRAPHY N/A 10/22/2016   Procedure: Left Heart Cath and Coronary Angiography;  Surgeon: Jettie Booze, MD;  Location: [place] [facility];  Service: Cardiovascular;  Laterality: N/A;   Social History   Socioeconomic History  . Marital status: Married    Spouse name: Not on file  . Number of children: 2  . Years of education: Not on file  . Highest education level: Not on file  Occupational History  . Occupation: [employer]: [employer]    Comment: Data base management  Social Needs  . Financial resource strain: Not on file  . Food insecurity    Worry: Not on file    Inability: Not on file  . Transportation needs    Medical: Not on file    Non-medical: Not on file  Tobacco Use  . Smoking status: Never Smoker  . Smokeless tobacco: Never Used  Substance and Sexual Activity  . Alcohol use: Yes    Comment: very rare  . Drug use: No  . Sexual activity: Not on file  Lifestyle  . Physical activity    Days per week: Not on file    Minutes per session: Not on file  . Stress: Not on file  Relationships  .  Social Herbalist on phone: Not on file    Gets together: Not on file    Attends religious service: Not on file    Active member of club or organization: Not on file    Attends meetings of clubs or organizations: Not on file    Relationship status: Not on file  . Intimate partner violence    Fear of current or ex partner: Not on file    Emotionally abused: Not on file    Physically abused: Not on file    Forced sexual activity: Not on file  Other Topics Concern  . Not on file  Social History Narrative  . Not on file   Family History   Problem Relation Age of Onset  . Hypertension Father    No Known Allergies  Medication list has been reviewed and updated.   General: Denies fever, chills, sweats. No significant weight loss. Eyes: Denies blurring,significant itching ENT: Denies earache, sore throat, and hoarseness. Cardiovascular: Denies chest pains, palpitations, dyspnea on exertion Respiratory: Denies cough, dyspnea at rest,wheeezing Breast: no concerns about lumps GI: Denies nausea, vomiting, diarrhea, constipation, change in bowel habits, abdominal pain, melena, hematochezia GU: Denies penile discharge, ED, urinary flow / outflow problems. No STD concerns. Musculoskeletal: Denies back pain, joint pain Derm: Denies rash, itching Neuro: Denies  paresthesias, frequent falls, frequent headaches Psych: Denies depression, anxiety Endocrine: Denies cold intolerance, heat intolerance, polydipsia Heme: Denies enlarged lymph nodes Allergy: No hayfever  Objective:   BP 130/90   Pulse 67   Temp 97.9 F (36.6 C) (Temporal)   Ht 5\' 8"  (1.727 m)   Wt 175 lb (79.4 kg)   SpO2 99%   BMI 26.61 kg/m  Ideal Body Weight: Weight in (lb) to have BMI = 25: 164.1  Ideal Body Weight: Weight in (lb) to have BMI = 25: 164.1 No exam data present Depression screen [facility] 2/9 02/16/2018  Decreased Interest 0  Down, Depressed, Hopeless 0  PHQ - 2 Score 0     GEN: well developed, well nourished, no acute distress Eyes: conjunctiva and lids normal, PERRLA, EOMI ENT: TM clear, nares clear, oral exam WNL Neck: supple, no lymphadenopathy, no thyromegaly, no JVD Pulm: clear to auscultation and percussion, respiratory effort normal CV: regular rate and rhythm, S1-S2, no murmur, rub or gallop, no bruits, peripheral pulses normal and symmetric, no cyanosis, clubbing, edema or varicosities GI: soft, non-tender; no hepatosplenomegaly, masses; active bowel sounds all quadrants GU: no hernia, testicular mass, penile discharge Lymph: no  cervical, axillary or inguinal adenopathy MSK: gait normal, muscle tone and strength WNL, no joint swelling, effusions, discoloration, crepitus  SKIN: clear, good turgor, color WNL, no rashes, lesions, or ulcerations Neuro: normal mental status, normal strength, sensation, and motion Psych: alert; oriented to person, place and time, normally interactive and not anxious or depressed in appearance. All labs reviewed with patient. Results for orders placed or performed in visit on 05/27/18  MYOCARDIAL PERFUSION IMAGING  Result Value Ref Range   Rest HR 62 bpm   Rest BP 144/99 mmHg   Exercise duration (sec) 0 sec   Percent HR 100 %   Exercise duration (min) 10 min   Estimated workload 11.7 METS   Peak HR 169 bpm   Peak BP 169/87 mmHg   MPHR 168 bpm   SSS 2    SRS 0    SDS 2    TID 0.79    LV sys vol 28 mL   LV  dias vol 70 62 - 150 mL    Assessment and Plan:     ICD-10-CM   1. Healthcare maintenance  K74.25 Basic metabolic panel    CBC with Differential/Platelet    Hemoglobin A1c    Lipid panel    PSA, Total with Reflex to PSA, Free    Hepatic function panel  2. Screening for malignant neoplasm of colon  Z12.11 Ambulatory referral to Gastroenterology   Reviewed basic LE rehab  Health Maintenance Exam: The patient's preventative maintenance and recommended screening tests for an annual wellness exam were reviewed in full today. Brought up to date unless services declined.  Counselled on the importance of diet, exercise, and its role in overall health and mortality. The patient's FH and SH was reviewed, including their home life, tobacco status, and drug and alcohol status.  Follow-up in 1 year for physical exam or additional follow-up below.  Follow-up: No follow-ups on file. Or follow-up in 1 year if not noted.  No orders of the defined types were placed in this encounter.  There are no discontinued medications. Orders Placed This Encounter  Procedures  . Basic  metabolic panel  . CBC with Differential/Platelet  . Hemoglobin A1c  . Lipid panel  . PSA, Total with Reflex to PSA, Free  . Hepatic function panel  . Ambulatory referral to Gastroenterology    Signed,  Frederico Hamman T. Rubina Basinski, MD   Allergies as of 01/19/2019   No Known Allergies     Medication List       Accurate as of January 19, 2019  8:31 AM. If you have any questions, ask your nurse or doctor.        atorvastatin 80 MG tablet Commonly known as: LIPITOR TAKE 1 TABLET (80 MG TOTAL) BY MOUTH DAILY AT 6 PM.   Brilinta 90 MG Tabs tablet Generic drug: ticagrelor TAKE 1 TABLET (90 MG TOTAL) BY MOUTH EVERY 12 (TWELVE) HOURS   lisinopril 20 MG tablet Commonly known as: ZESTRIL Take 1 tablet (20 mg total) by mouth daily.   metoprolol tartrate 25 MG tablet Commonly known as: LOPRESSOR TAKE HALF TABLET BY MOUTH 2 (TWO) TIMES DAILY.   nitroGLYCERIN 0.4 MG SL tablet Commonly known as: NITROSTAT Place 1 tablet (0.4 mg total) under the tongue every 5 (five) minutes as needed.   sildenafil 20 MG tablet Commonly known as: REVATIO TAKE 2 TO 5 TABLETS (40-100MG ) BY MOUTH 30 MINUTES PRIOR TO INTERCOURSE AS DIRECTED

## 2019-01-20 ENCOUNTER — Encounter: Payer: Self-pay | Admitting: *Deleted

## 2019-01-20 LAB — PSA, TOTAL WITH REFLEX TO PSA, FREE: PSA, Total: 1.4 ng/mL (ref <=4.0)

## 2019-01-29 ENCOUNTER — Other Ambulatory Visit: Payer: Self-pay | Admitting: Physician Assistant

## 2019-02-06 ENCOUNTER — Other Ambulatory Visit: Payer: Commercial Managed Care - PPO | Admitting: *Deleted

## 2019-02-06 ENCOUNTER — Other Ambulatory Visit: Payer: Self-pay

## 2019-02-06 DIAGNOSIS — E785 Hyperlipidemia, unspecified: Secondary | ICD-10-CM

## 2019-02-06 DIAGNOSIS — I10 Essential (primary) hypertension: Secondary | ICD-10-CM

## 2019-02-06 DIAGNOSIS — I2510 Atherosclerotic heart disease of native coronary artery without angina pectoris: Secondary | ICD-10-CM

## 2019-02-06 DIAGNOSIS — R7303 Prediabetes: Secondary | ICD-10-CM

## 2019-02-06 LAB — COMPREHENSIVE METABOLIC PANEL
ALT: 31 IU/L (ref 0–44)
AST: 23 IU/L (ref 0–40)
Albumin/Globulin Ratio: 1.8 (ref 1.2–2.2)
Albumin: 4.2 g/dL (ref 3.8–4.9)
Alkaline Phosphatase: 71 IU/L (ref 39–117)
BUN/Creatinine Ratio: 11 (ref 9–20)
BUN: 11 mg/dL (ref 6–24)
Bilirubin Total: 0.4 mg/dL (ref 0.0–1.2)
CO2: 23 mmol/L (ref 20–29)
Calcium: 8.9 mg/dL (ref 8.7–10.2)
Chloride: 102 mmol/L (ref 96–106)
Creatinine, Ser: 0.98 mg/dL (ref 0.76–1.27)
GFR calc Af Amer: 102 mL/min/{1.73_m2} (ref >59)
GFR calc non Af Amer: 88 mL/min/{1.73_m2} (ref >59)
Globulin, Total: 2.4 g/dL (ref 1.5–4.5)
Glucose: 104 mg/dL — ABNORMAL HIGH (ref 65–99)
Potassium: 4.3 mmol/L (ref 3.5–5.2)
Sodium: 138 mmol/L (ref 134–144)
Total Protein: 6.6 g/dL (ref 6.0–8.5)

## 2019-02-06 LAB — CBC
Hematocrit: 46.4 % (ref 37.5–51.0)
Hemoglobin: 15.6 g/dL (ref 13.0–17.7)
MCH: 29.4 pg (ref 26.6–33.0)
MCHC: 33.6 g/dL (ref 31.5–35.7)
MCV: 88 fL (ref 79–97)
Platelets: 176 10*3/uL (ref 150–450)
RBC: 5.3 x10E6/uL (ref 4.14–5.80)
RDW: 12 % (ref 11.6–15.4)
WBC: 5.9 10*3/uL (ref 3.4–10.8)

## 2019-02-06 LAB — LIPID PANEL
Chol/HDL Ratio: 2.9 ratio (ref 0.0–5.0)
Cholesterol, Total: 97 mg/dL — ABNORMAL LOW (ref 100–199)
HDL: 34 mg/dL — ABNORMAL LOW (ref >39)
LDL Calculated: 51 mg/dL (ref 0–99)
Triglycerides: 62 mg/dL (ref 0–149)
VLDL Cholesterol Cal: 12 mg/dL (ref 5–40)

## 2019-02-15 ENCOUNTER — Encounter: Payer: Self-pay | Admitting: Physician Assistant

## 2019-02-20 ENCOUNTER — Ambulatory Visit: Payer: Commercial Managed Care - PPO | Admitting: Family Medicine

## 2019-02-22 ENCOUNTER — Other Ambulatory Visit: Payer: Self-pay

## 2019-02-22 ENCOUNTER — Ambulatory Visit: Payer: Commercial Managed Care - PPO | Admitting: Family Medicine

## 2019-02-22 VITALS — BP 92/60 | HR 63 | Temp 98.4°F | Ht 68.0 in | Wt 173.2 lb

## 2019-02-22 DIAGNOSIS — R300 Dysuria: Secondary | ICD-10-CM | POA: Diagnosis not present

## 2019-02-22 DIAGNOSIS — N410 Acute prostatitis: Secondary | ICD-10-CM | POA: Diagnosis not present

## 2019-02-22 LAB — POC URINALSYSI DIPSTICK (AUTOMATED)
Bilirubin, UA: NEGATIVE
Blood, UA: NEGATIVE
Glucose, UA: NEGATIVE
Ketones, UA: NEGATIVE
Leukocytes, UA: NEGATIVE
Nitrite, UA: NEGATIVE
Protein, UA: NEGATIVE
Spec Grav, UA: >=1.03 — AB (ref 1.010–1.025)
Urobilinogen, UA: 0.2 E.U./dL
pH, UA: 6 (ref 5.0–8.0)

## 2019-02-22 MED ORDER — CIPROFLOXACIN HCL 500 MG PO TABS: 500.0000 mg | ORAL_TABLET | Freq: Two times a day (BID) | ORAL | 0 refills | Status: DC

## 2019-02-22 NOTE — Progress Notes (Signed)
Steven Kemp T. Steven Olds, MD Primary Care and [place] at [facility] [name], [address] Phone: [phone]  FAX: [phone]  [name] - 53 y.o. male  MRN [id number]  Date of Birth: January 09, 1966  Visit Date: 02/22/2019  PCP: Owens Loffler, MD  Referred by: Owens Loffler, MD  Chief Complaint  Patient presents with  . Dysuria  . Back Pain    Lower   Subjective:   Steven Kemp is a 53 y.o. very pleasant male patient who presents with the following:  Acute prostatitis.  Urinating and felt uncomfortable. Perineal area and ejaculation. Has hurt, but he is feeling better somewhat now. No std exposure and monogamous for a long time. No discharge.  Past Medical History, Surgical History, Social History, Family History, Problem List, Medications, and Allergies have been reviewed and updated if relevant.  Patient Active Problem List   Diagnosis Date Noted  . CAD (coronary artery disease) 12/28/2016  . Acute MI, lateral wall ([place])   . Borderline diabetes 09/08/2013  . BPPV (benign paroxysmal positional vertigo) 02/10/2013  . Hyperlipidemia LDL goal <70 10/09/2009  . Essential hypertension 10/09/2009  . EXTERNAL HEMORRHOIDS WITHOUT MENTION COMP 07/24/2009  . ALLERGIC  RHINITIS 02/03/2007    Past Medical History:  Diagnosis Date  . Borderline diabetes   . CAD (coronary artery disease), native coronary artery    a. STEMI 10/22/2016 s/p DES to the first diagonal with residual moderate disease in the LAD, right PDA and distal LAD.  Marland Kitchen Hyperlipidemia   . Hypertension     Past Surgical History:  Procedure Laterality Date  . CORONARY STENT INTERVENTION N/A 10/22/2016   Procedure: Coronary Stent Intervention;  Surgeon: Jettie Booze, MD;  Location: [place] [facility];  Service: Cardiovascular;  Laterality: N/A;  . LEFT HEART CATH AND CORONARY ANGIOGRAPHY N/A 10/22/2016   Procedure: Left Heart Cath and  Coronary Angiography;  Surgeon: Jettie Booze, MD;  Location: [place] [facility];  Service: Cardiovascular;  Laterality: N/A;    Social History   Socioeconomic History  . Marital status: Married    Spouse name: Not on file  . Number of children: 2  . Years of education: Not on file  . Highest education level: Not on file  Occupational History  . Occupation: [employer]: [employer]    Comment: Data base management  Social Needs  . Financial resource strain: Not on file  . Food insecurity    Worry: Not on file    Inability: Not on file  . Transportation needs    Medical: Not on file    Non-medical: Not on file  Tobacco Use  . Smoking status: Never Smoker  . Smokeless tobacco: Never Used  Substance and Sexual Activity  . Alcohol use: Yes    Comment: very rare  . Drug use: No  . Sexual activity: Not on file  Lifestyle  . Physical activity    Days per week: Not on file    Minutes per session: Not on file  . Stress: Not on file  Relationships  . Social Herbalist on phone: Not on file    Gets together: Not on file    Attends religious service: Not on file    Active member of club or organization: Not on file    Attends meetings of clubs or organizations: Not on file    Relationship status: Not on  file  . Intimate partner violence    Fear of current or ex partner: Not on file    Emotionally abused: Not on file    Physically abused: Not on file    Forced sexual activity: Not on file  Other Topics Concern  . Not on file  Social History Narrative  . Not on file    Family History  Problem Relation Age of Onset  . Hypertension Father     No Known Allergies  Medication list reviewed and updated in full in [place].   GEN: No acute illnesses, no fevers, chills. GI: No n/v/d, eating normally Pulm: No SOB Interactive and getting along well at home.  Otherwise, ROS is as per the HPI.  Objective:   BP 92/60    Pulse 63   Temp 98.4 F (36.9 C) (Temporal)   Ht 5\' 8"  (1.727 m)   Wt 173 lb 4 oz (78.6 kg)   SpO2 97%   BMI 26.34 kg/m   GEN: WDWN, NAD, Non-toxic, A & O x 3 HEENT: Atraumatic, Normocephalic. Neck supple. No masses, No LAD. Ears and Nose: No external deformity. CV: RRR, No M/G/R. No JVD. No thrill. No extra heart sounds. PULM: CTA B, no wheezes, crackles, rhonchi. No retractions. No resp. distress. No accessory muscle use. EXTR: No c/c/e NEURO Normal gait.  PSYCH: Normally interactive. Conversant. Not depressed or anxious appearing.  Calm demeanor.   Laboratory and Imaging Data: Results for orders placed or performed in visit on 02/22/19  POCT Urinalysis Dipstick (Automated)  Result Value Ref Range   Color, UA Yellow    Clarity, UA Clear    Glucose, UA Negative Negative   Bilirubin, UA Negative    Ketones, UA Negative    Spec Grav, UA >=1.030 (A) 1.010 - 1.025   Blood, UA Negative    pH, UA 6.0 5.0 - 8.0   Protein, UA Negative Negative   Urobilinogen, UA 0.2 0.2 or 1.0 E.U./dL   Nitrite, UA Negative    Leukocytes, UA Negative Negative     Assessment and Plan:     ICD-10-CM   1. Acute prostatitis  N41.0   2. Dysuria  R30.0 POCT Urinalysis Dipstick (Automated)   C/w acute prostatitis. Cipro, if not better at 2 weeks, will extend.   Follow-up: No follow-ups on file.  Meds ordered this encounter  Medications  . ciprofloxacin (CIPRO) 500 MG tablet    Sig: Take 1 tablet (500 mg total) by mouth 2 (two) times daily.    Dispense:  28 tablet    Refill:  0   Orders Placed This Encounter  Procedures  . POCT Urinalysis Dipstick (Automated)    Signed,  Mycah Formica T. Athel Merriweather, MD   Outpatient Encounter Medications as of 02/22/2019  Medication Sig  . atorvastatin (LIPITOR) 80 MG tablet TAKE 1 TABLET (80 MG TOTAL) BY MOUTH DAILY AT 6 PM.  . BRILINTA 90 MG TABS tablet TAKE 1 TABLET (90 MG TOTAL) BY MOUTH EVERY 12 (TWELVE) HOURS  . lisinopril (PRINIVIL,ZESTRIL) 20 MG tablet  Take 1 tablet (20 mg total) by mouth daily.  . metoprolol tartrate (LOPRESSOR) 25 MG tablet Take 0.5 tablets (12.5 mg total) by mouth 2 (two) times daily. Please make appt with Dr. Irish Lack for future refills. [phone]. Thank you  . nitroGLYCERIN (NITROSTAT) 0.4 MG SL tablet Place 1 tablet (0.4 mg total) under the tongue every 5 (five) minutes as needed.  . sildenafil (REVATIO) 20 MG tablet TAKE 2 TO 5 TABLETS (  40-100MG ) BY MOUTH 30 MINUTES PRIOR TO INTERCOURSE AS DIRECTED  . ciprofloxacin (CIPRO) 500 MG tablet Take 1 tablet (500 mg total) by mouth 2 (two) times daily.   No facility-administered encounter medications on file as of 02/22/2019.

## 2019-03-06 ENCOUNTER — Telehealth: Payer: Self-pay

## 2019-03-06 ENCOUNTER — Encounter: Payer: Self-pay | Admitting: Physician Assistant

## 2019-03-06 ENCOUNTER — Ambulatory Visit: Payer: Commercial Managed Care - PPO | Admitting: Physician Assistant

## 2019-03-06 VITALS — BP 110/72 | HR 86 | Temp 98.7°F | Ht 68.0 in | Wt 174.2 lb

## 2019-03-06 DIAGNOSIS — Z1211 Encounter for screening for malignant neoplasm of colon: Secondary | ICD-10-CM | POA: Diagnosis not present

## 2019-03-06 MED ORDER — SUPREP BOWEL PREP KIT 17.5-3.13-1.6 GM/177ML PO SOLN: 1.0000 | ORAL | 0 refills | Status: DC

## 2019-03-06 NOTE — Telephone Encounter (Signed)
Dr. Irish Lack  Can pt be off brilinta for 5 days for colonoscopy last stent in 10/22/16.  He is no longer on ASA so should he be on ASA while off brilinta?  I know he has been off brilinta in past for surgery but wanted to clarify thanks.  Please send to pre-op pool

## 2019-03-06 NOTE — Telephone Encounter (Signed)
I don't see that patient is on anticoagulation. Please send to MD for clearance on antiplatelet.

## 2019-03-06 NOTE — Progress Notes (Signed)
Chief Complaint: Consultation for screening colonoscopy on chronic anticoagulation  HPI:    Steven Kemp is a 53 year old male with a past medical history as listed below including CAD status post drug-eluting stent 10/22/2016 (10/22/2016 echo with LVEF 50-55%) on Brilinta, who was referred to me by Owens Loffler, MD for a consultation for screening colonoscopy on chronic anticoagulation.      Today, the patient presents clinic and explains that he is doing well but is in need of a screening colonoscopy.  Does tell me he has had to hold his Brilinta before for a vasectomy and everything was fine then.  Denies any GI complaints or concerns.    Social history positive for working at the [employer] on [employer].    Denies fever, chills, weight loss, nausea, vomiting, heartburn, reflux or symptoms that awaken him from sleep.  Past Medical History:  Diagnosis Date   Borderline diabetes    CAD (coronary artery disease), native coronary artery    a. STEMI 10/22/2016 s/p DES to the first diagonal with residual moderate disease in the LAD, right PDA and distal LAD.   Hyperlipidemia    Hypertension     Past Surgical History:  Procedure Laterality Date   CORONARY STENT INTERVENTION N/A 10/22/2016   Procedure: Coronary Stent Intervention;  Surgeon: Jettie Booze, MD;  Location: [place] [facility];  Service: Cardiovascular;  Laterality: N/A;   LEFT HEART CATH AND CORONARY ANGIOGRAPHY N/A 10/22/2016   Procedure: Left Heart Cath and Coronary Angiography;  Surgeon: Jettie Booze, MD;  Location: [place] [facility];  Service: Cardiovascular;  Laterality: N/A;    Current Outpatient Medications  Medication Sig Dispense Refill   atorvastatin (LIPITOR) 80 MG tablet TAKE 1 TABLET (80 MG TOTAL) BY MOUTH DAILY AT 6 PM. 90 tablet 2   BRILINTA 90 MG TABS tablet TAKE 1 TABLET (90 MG TOTAL) BY MOUTH EVERY 12 (TWELVE) HOURS 60 tablet 8   ciprofloxacin (CIPRO) 500 MG tablet Take 1 tablet (500 mg total) by  mouth 2 (two) times daily. 28 tablet 0   lisinopril (PRINIVIL,ZESTRIL) 20 MG tablet Take 1 tablet (20 mg total) by mouth daily. 90 tablet 3   metoprolol tartrate (LOPRESSOR) 25 MG tablet Take 0.5 tablets (12.5 mg total) by mouth 2 (two) times daily. Please make appt with Dr. Irish Lack for future refills. [phone]. Thank you 90 tablet 0   nitroGLYCERIN (NITROSTAT) 0.4 MG SL tablet Place 1 tablet (0.4 mg total) under the tongue every 5 (five) minutes as needed. 25 tablet 0   sildenafil (REVATIO) 20 MG tablet TAKE 2 TO 5 TABLETS (40-100MG ) BY MOUTH 30 MINUTES PRIOR TO INTERCOURSE AS DIRECTED 100 tablet 5   No current facility-administered medications for this visit.     Allergies as of 03/06/2019   (No Known Allergies)    Family History  Problem Relation Age of Onset   Hypertension Father     Social History   Socioeconomic History   Marital status: Married    Spouse name: Not on file   Number of children: 2   Years of education: Not on file   Highest education level: Not on file  Occupational History   Occupation: [employer]: [employer]    Comment: Data base management  Social Needs   Financial resource strain: Not on file   Food insecurity    Worry: Not on file    Inability: Not on file   Transportation needs  Medical: Not on file    Non-medical: Not on file  Tobacco Use   Smoking status: Never Smoker   Smokeless tobacco: Never Used  Substance and Sexual Activity   Alcohol use: Yes    Comment: very rare   Drug use: No   Sexual activity: Not on file  Lifestyle   Physical activity    Days per week: Not on file    Minutes per session: Not on file   Stress: Not on file  Relationships   Social connections    Talks on phone: Not on file    Gets together: Not on file    Attends religious service: Not on file    Active member of club or organization: Not on file    Attends meetings of clubs or organizations: Not on  file    Relationship status: Not on file   Intimate partner violence    Fear of current or ex partner: Not on file    Emotionally abused: Not on file    Physically abused: Not on file    Forced sexual activity: Not on file  Other Topics Concern   Not on file  Social History Narrative   Not on file    Review of Systems:    Constitutional: No weight loss, fever or chills Skin: No rash  Cardiovascular: No chest pain Respiratory: No SOB  Gastrointestinal: See HPI and otherwise negative Genitourinary: No dysuria  Neurological: No headache, dizziness or syncope Musculoskeletal: No new muscle or joint pain Hematologic: No bleeding Psychiatric: No history of depression or anxiety   Physical Exam:  Vital signs: BP 110/72 (BP Location: Left Arm, Patient Position: Sitting)    Pulse 86    Temp 98.7 F (37.1 C)    Ht 5\' 8"  (1.727 m)    Wt 174 lb 3.2 oz (79 kg)    SpO2 98%    BMI 26.49 kg/m   Constitutional:   Pleasant male appears to be in NAD, Well developed, Well nourished, alert and cooperative Head:  Normocephalic and atraumatic. Eyes:   PEERL, EOMI. No icterus. Conjunctiva pink. Ears:  Normal auditory acuity. Neck:  Supple Throat: Oral cavity and pharynx without inflammation, swelling or lesion.  Respiratory: Respirations even and unlabored. Lungs clear to auscultation bilaterally.   No wheezes, crackles, or rhonchi.  Cardiovascular: Normal S1, S2. No MRG. Regular rate and rhythm. No peripheral edema, cyanosis or pallor.  Gastrointestinal:  Soft, nondistended, nontender. No rebound or guarding. Normal bowel sounds. No appreciable masses or hepatomegaly. Rectal:  Not performed.  Msk:  Symmetrical without gross deformities. Without edema, no deformity or joint abnormality.  Neurologic:  Alert and  oriented x4;  grossly normal neurologically.  Skin:   Dry and intact without significant lesions or rashes. Psychiatric: Demonstrates good judgement and reason without abnormal affect or  behaviors.  MOST RECENT LABS AND IMAGING: CBC    Component Value Date/Time   WBC 5.9 02/06/2019 0759   WBC 6.4 01/19/2019 0841   RBC 5.30 02/06/2019 0759   RBC 5.55 01/19/2019 0841   HGB 15.6 02/06/2019 0759   HCT 46.4 02/06/2019 0759   PLT 176 02/06/2019 0759   MCV 88 02/06/2019 0759   MCH 29.4 02/06/2019 0759   MCH 28.5 10/23/2016 0250   MCHC 33.6 02/06/2019 0759   MCHC 34.0 01/19/2019 0841   RDW 12.0 02/06/2019 0759   LYMPHSABS 1.6 01/19/2019 0841   MONOABS 0.5 01/19/2019 0841   EOSABS 0.1 01/19/2019 0841   BASOSABS 0.0 01/19/2019  0841    CMP     Component Value Date/Time   NA 138 02/06/2019 0759   K 4.3 02/06/2019 0759   CL 102 02/06/2019 0759   CO2 23 02/06/2019 0759   GLUCOSE 104 (H) 02/06/2019 0759   GLUCOSE 102 (H) 01/19/2019 0841   BUN 11 02/06/2019 0759   CREATININE 0.98 02/06/2019 0759   CALCIUM 8.9 02/06/2019 0759   PROT 6.6 02/06/2019 0759   ALBUMIN 4.2 02/06/2019 0759   AST 23 02/06/2019 0759   ALT 31 02/06/2019 0759   ALKPHOS 71 02/06/2019 0759   BILITOT 0.4 02/06/2019 0759   GFRNONAA 88 02/06/2019 0759   GFRAA 102 02/06/2019 0759    Assessment: 1.  Screening for colorectal cancer: 32 and never had a screening colonoscopy 2.  Chronic anticoagulation: Status post stent in May 2018 on Brilinta  Plan: 1.  Scheduled patient for a colonoscopy in the [place] with Dr. Tarri Glenn, did discuss risks, benefits, limitations and alternatives and the patient agrees to proceed. 2.  Patient was advised to hold his Brilinta for 5 days prior to time procedure.  We will communicate with his cardiologist to ensure that holding his Brilinta is acceptable for him. 3.  Patient to follow in clinic per recommendations from Dr. Tarri Glenn after time of procedure.  Ellouise Newer, PA-C [facility] 03/06/2019, 8:20 AM  Cc: Owens Loffler, MD

## 2019-03-06 NOTE — Telephone Encounter (Signed)
Please see clearance to hold Brilinta x5 days prior to endoscopies.

## 2019-03-06 NOTE — Patient Instructions (Addendum)
You have been scheduled for a colonoscopy. Please follow written instructions given to you at your visit today.  Please pick up your prep supplies at the pharmacy within the next 1-3 days. If you use inhalers (even only as needed), please bring them with you on the day of your procedure.  If you are age 53 or older, your body mass index should be between 23-30. Your Body mass index is 26.49 kg/m. If this is out of the aforementioned range listed, please consider follow up with your Primary Care Provider.  If you are age 39 or younger, your body mass index should be between 19-25. Your Body mass index is 26.49 kg/m. If this is out of the aformentioned range listed, please consider follow up with your Primary Care Provider.    Thank you for choosing me and [facility].  Dennison Bulla

## 2019-03-06 NOTE — Telephone Encounter (Signed)
Request for surgical clearance:     Endoscopy Procedure  What type of surgery is being performed?   Colonoscopy  When is this surgery scheduled?     03/16/2019  What type of clearance is required ?   Pharmacy  Are there any medications that need to be held prior to surgery and how long? Brilinta x5 days   Practice name and name of physician performing surgery?      [facility]  What is your office phone and fax number?      Phone- [phone]  Fax- [phone]- Attn: [email or web address]   Anesthesia type (None, local, MAC, general) ?       MAC

## 2019-03-06 NOTE — Progress Notes (Signed)
Reviewed. I agree with documentation including the assessment and plan. Colonoscopy recommended after a Brilinta washout.  Yasmyn Bellisario L. Tarri Glenn, MD, MPH

## 2019-03-08 NOTE — Telephone Encounter (Signed)
OK to hold Brilinta for 5 days prior to colonoscopy.  OK to be off of aspirin during that time as well.   I lost the ID for the preop pool

## 2019-03-08 NOTE — Telephone Encounter (Signed)
Pt informed that it is okay to hold  Brilinta x5 days prior to procedure.

## 2019-03-15 ENCOUNTER — Telehealth: Payer: Self-pay

## 2019-03-15 NOTE — Telephone Encounter (Signed)
Covid-19 screening questions   Do you now or have you had a fever in the last 14 days?  Do you have any respiratory symptoms of shortness of breath or cough now or in the last 14 days?  Do you have any family members or close contacts with diagnosed or suspected Covid-19 in the past 14 days?  Have you been tested for Covid-19 and found to be positive?       

## 2019-03-15 NOTE — Telephone Encounter (Signed)
Pt answered "NO" to all questions °

## 2019-03-16 ENCOUNTER — Ambulatory Visit (AMBULATORY_SURGERY_CENTER): Payer: Commercial Managed Care - PPO | Admitting: Gastroenterology

## 2019-03-16 ENCOUNTER — Encounter: Payer: Self-pay | Admitting: Gastroenterology

## 2019-03-16 ENCOUNTER — Other Ambulatory Visit: Payer: Self-pay

## 2019-03-16 VITALS — BP 104/60 | HR 68 | Temp 97.5°F | Resp 12 | Ht 68.0 in | Wt 174.0 lb

## 2019-03-16 DIAGNOSIS — D124 Benign neoplasm of descending colon: Secondary | ICD-10-CM

## 2019-03-16 DIAGNOSIS — D125 Benign neoplasm of sigmoid colon: Secondary | ICD-10-CM

## 2019-03-16 DIAGNOSIS — Z1211 Encounter for screening for malignant neoplasm of colon: Secondary | ICD-10-CM | POA: Diagnosis not present

## 2019-03-16 MED ORDER — SODIUM CHLORIDE 0.9 % IV SOLN: 500.0000 mL | Freq: Once | INTRAVENOUS | Status: DC

## 2019-03-16 NOTE — Patient Instructions (Signed)
YOU HAD AN ENDOSCOPIC PROCEDURE TODAY AT [facility]:   Refer to the procedure report that was given to you for any specific questions about what was found during the examination.  If the procedure report does not answer your questions, please call your gastroenterologist to clarify.  If you requested that your care partner not be given the details of your procedure findings, then the procedure report has been included in a sealed envelope for you to review at your convenience later.  YOU SHOULD EXPECT: Some feelings of bloating in the abdomen. Passage of more gas than usual.  Walking can help get rid of the air that was put into your GI tract during the procedure and reduce the bloating. If you had a lower endoscopy (such as a colonoscopy or flexible sigmoidoscopy) you may notice spotting of blood in your stool or on the toilet paper. If you underwent a bowel prep for your procedure, you may not have a normal bowel movement for a few days.  Please Note:  You might notice some irritation and congestion in your nose or some drainage.  This is from the oxygen used during your procedure.  There is no need for concern and it should clear up in a day or so.  SYMPTOMS TO REPORT IMMEDIATELY:   Following lower endoscopy (colonoscopy or flexible sigmoidoscopy):  Excessive amounts of blood in the stool  Significant tenderness or worsening of abdominal pains  Swelling of the abdomen that is new, acute  Fever of 100F or higher For urgent or emergent issues, a gastroenterologist can be reached at any hour by calling [phone].  DIET:  We do recommend a small meal at first, but then you may proceed to your regular diet.  Drink plenty of fluids but you should avoid alcoholic beverages for 24 hours.  ACTIVITY:  You should plan to take it easy for the rest of today and you should NOT DRIVE or use heavy machinery until tomorrow (because of the sedation medicines used during the test).     FOLLOW UP: Our staff will call the number listed on your records 48-72 hours following your procedure to check on you and address any questions or concerns that you may have regarding the information given to you following your procedure. If we do not reach you, we will leave a message.  We will attempt to reach you two times.  During this call, we will ask if you have developed any symptoms of COVID 19. If you develop any symptoms (ie: fever, flu-like symptoms, shortness of breath, cough etc.) before then, please call [phone].  If you test positive for Covid 19 in the 2 weeks post procedure, please call and report this information to [place].    If any biopsies were taken you will be contacted by phone or by letter within the next 1-3 weeks.  Please call us at [phone] if you have not heard about the biopsies in 3 weeks.   SIGNATURES/CONFIDENTIALITY: You and/or your care partner have signed paperwork which will be entered into your electronic medical record.  These signatures attest to the fact that that the information above on your After Visit Summary has been reviewed and is understood.  Full responsibility of the confidentiality of this discharge information lies with you and/or your care-partner.  Await pathology  RESUME BRILINTA TOMORROW  Please read over handouts about polyps

## 2019-03-16 NOTE — Progress Notes (Signed)
When I called pt's wife to give her report and to get the car, informed her as well that patient is to restart Brilinta tomorrow (03-17-19).  Understanding voiced

## 2019-03-16 NOTE — Progress Notes (Signed)
To PACU, VSS. Report to [email or web address] 

## 2019-03-16 NOTE — Progress Notes (Signed)
Called to room to assist during endoscopic procedure.  Patient ID and intended procedure confirmed with present staff. Received instructions for my participation in the procedure from the performing physician.  

## 2019-03-16 NOTE — Progress Notes (Signed)
Temp check by JB/vital check by [place].  Reviewed medical and surgical history with no changes necessary.

## 2019-03-16 NOTE — Op Note (Signed)
[place] Patient Name: Steven Kemp Procedure Date: 03/16/2019 9:24 AM MRN: [id number] Endoscopist: Thornton Park MD, MD Age: 53 Referring MD:  Date of Birth: 1965/10/10 Gender: Male Account #: [id number] Procedure:                Colonoscopy Indications:              Screening for colorectal malignant neoplasm, This                            is the patient's first colonoscopy Medicines:                See the Anesthesia note for documentation of the                            administered medications Procedure:                Pre-Anesthesia Assessment:                           - Prior to the procedure, a History and Physical                            was performed, and patient medications and                            allergies were reviewed. The patient's tolerance of                            previous anesthesia was also reviewed. The risks                            and benefits of the procedure and the sedation                            options and risks were discussed with the patient.                            All questions were answered, and informed consent                            was obtained. Prior Anticoagulants: The patient has                            taken Brilinta, last dose was 5 days prior to                            procedure. After reviewing the risks and benefits,                            the patient was deemed in satisfactory condition to                            undergo the procedure.  After obtaining informed consent, the colonoscope                            was passed under direct vision. Throughout the                            procedure, the patient's blood pressure, pulse, and                            oxygen saturations were monitored continuously. The                            Colonoscope was introduced through the anus and                            advanced to the the terminal ileum,  with                            identification of the appendiceal orifice and IC                            valve. A second forward view of the right colon was                            performed. The colonoscopy was performed without                            difficulty. The patient tolerated the procedure                            well. The quality of the bowel preparation was                            good. The terminal ileum, ileocecal valve,                            appendiceal orifice, and rectum were photographed. Scope In: 9:28:07 AM Scope Out: 9:44:22 AM Scope Withdrawal Time: 0 hours 11 minutes 30 seconds  Total Procedure Duration: 0 hours 16 minutes 15 seconds  Findings:                 The perianal and digital rectal examinations were                            normal.                           A 4 mm polyp was found in the mid sigmoid colon.                            The polyp was sessile. The polyp was removed with a                            cold snare. Resection and retrieval  were complete.                            Estimated blood loss was minimal.                           A less than 1 mm polyp was found in the descending                            colon. The polyp was sessile. The polyp was removed                            with a cold biopsy forceps. Resection and retrieval                            were complete.                           The exam was otherwise without abnormality on                            direct and retroflexion views. Complications:            No immediate complications. Estimated blood loss:                            Minimal. Estimated Blood Loss:     Estimated blood loss was minimal. Impression:               - One 4 mm polyp in the mid sigmoid colon, removed                            with a cold snare. Resected and retrieved.                           - One less than 1 mm polyp in the descending colon,                             removed with a cold biopsy forceps. Resected and                            retrieved.                           - The examination was otherwise normal on direct                            and retroflexion views. Recommendation:           - Patient has a contact number available for                            emergencies. The signs and symptoms of potential                            delayed complications  were discussed with the                            patient. Return to normal activities tomorrow.                            Written discharge instructions were provided to the                            patient.                           - Resume previous diet today.                           - Resume Brilinta at prior dose tomorrow.                           - Await pathology results.                           - Repeat colonoscopy in 7 years for surveillance if                            the polyp is an adenoma. Consider repeat                            colonoscopy in 5 years with any family history of                            colon cancer or polyps. Thornton Park MD, MD 03/16/2019 9:52:23 AM This report has been signed electronically.

## 2019-03-20 ENCOUNTER — Telehealth: Payer: Self-pay | Admitting: *Deleted

## 2019-03-20 NOTE — Telephone Encounter (Signed)
  Follow up Call-  Call back number 03/16/2019  Post procedure Call Back phone  # [phone]  Permission to leave phone message Yes  Some recent data might be hidden     Patient questions:  Do you have a fever, pain , or abdominal swelling? No. Pain Score  0 *  Have you tolerated food without any problems? Yes.    Have you been able to return to your normal activities? Yes.    Do you have any questions about your discharge instructions: Diet   No. Medications  No. Follow up visit  No.  Do you have questions or concerns about your Care? No.  Actions: * If pain score is 4 or above: No action needed, pain <4.  1. Have you developed a fever since your procedure? no  2.   Have you had an respiratory symptoms (SOB or cough) since your procedure? no  3.   Have you tested positive for COVID 19 since your procedure no  4.   Have you had any family members/close contacts diagnosed with the COVID 19 since your procedure?  no   If yes to any of these questions please route to Joylene John, RN and Alphonsa Gin, Therapist, sports.

## 2019-03-21 ENCOUNTER — Encounter: Payer: Self-pay | Admitting: Gastroenterology

## 2019-04-14 ENCOUNTER — Other Ambulatory Visit: Payer: Self-pay | Admitting: Interventional Cardiology

## 2019-04-26 ENCOUNTER — Other Ambulatory Visit: Payer: Self-pay | Admitting: Interventional Cardiology

## 2019-04-27 ENCOUNTER — Other Ambulatory Visit: Payer: Self-pay | Admitting: Interventional Cardiology

## 2019-05-08 ENCOUNTER — Other Ambulatory Visit: Payer: Self-pay | Admitting: Physician Assistant

## 2019-05-22 ENCOUNTER — Ambulatory Visit (INDEPENDENT_AMBULATORY_CARE_PROVIDER_SITE_OTHER): Payer: Commercial Managed Care - PPO | Admitting: Family Medicine

## 2019-05-22 ENCOUNTER — Encounter: Payer: Self-pay | Admitting: Family Medicine

## 2019-05-22 ENCOUNTER — Other Ambulatory Visit: Payer: Self-pay

## 2019-05-22 VITALS — Temp 98.2°F | Ht 68.0 in

## 2019-05-22 DIAGNOSIS — R062 Wheezing: Secondary | ICD-10-CM

## 2019-05-22 DIAGNOSIS — Z9189 Other specified personal risk factors, not elsewhere classified: Secondary | ICD-10-CM | POA: Diagnosis not present

## 2019-05-22 DIAGNOSIS — R05 Cough: Secondary | ICD-10-CM | POA: Diagnosis not present

## 2019-05-22 DIAGNOSIS — R059 Cough, unspecified: Secondary | ICD-10-CM

## 2019-05-22 NOTE — Progress Notes (Addendum)
Deundra Bard T. Jenea Dake, MD Primary Care and [place] at [facility] [place], [address] Phone: [phone]   FAX: [phone]  Terrio Turin - 53 y.o. male   MRN [id number]   Date of Birth: 05-Jul-1965  Visit Date: 05/22/2019   PCP: Owens Loffler, MD   Referred by: Owens Loffler, MD Chief Complaint  Patient presents with   Cough   Wheezing   Nasal Congestion   Virtual Visit via Video Note:  I connected with  Dahani Perezhernandez on 05/22/2019 11:00 AM EST by a video enabled telemedicine application and verified that I am speaking with the correct person using two identifiers.   Location patient: home computer, tablet, or smartphone Location provider: work or home office Consent: Verbal consent directly obtained from Dara Lords. Persons participating in the virtual visit: patient, provider  I discussed the limitations of evaluation and management by telemedicine and the availability of in person appointments. The patient expressed understanding and agreed to proceed.  Interactive audio and video telecommunications were attempted between this provider and patient, however failed, due to patient having technical difficulties OR patient did not have access to video capability.  We continued and completed visit with audio only.    History of Present Illness:  Cough and wheezing - virtual visit. 7- 10 days. Last week sneezing like a madman  A little wheezy in his chest Over 1 week or more  No f, no n/v/d. No earache or sinus pain.  Review of Systems as above: See pertinent positives and pertinent negatives per HPI No acute distress verbally  Past Medical History, Surgical History, Social History, Family History, Problem List, Medications, and Allergies have been reviewed and updated if relevant.   Observations/Objective/Exam:  An attempt was made to discern vital signs over the phone and per patient if  applicable and possible.   General:    Alert, Oriented, appears well and in no acute distress HEENT:     Atraumatic, conjunctiva clear, no obvious abnormalities on inspection of external nose and ears.  Neck:    Normal movements of the head and neck Pulmonary:     On inspection no signs of respiratory distress, breathing rate appears normal, no obvious gross SOB, gasping or wheezing Cardiovascular:    No obvious cyanosis Musculoskeletal:    Moves all visible extremities without noticeable abnormality Psych / Neurological:     Pleasant and cooperative, no obvious depression or anxiety, speech and thought processing grossly intact  Assessment and Plan:    ICD-10-CM   1. Wheezing  R06.2   2. Coughing  R05   3. At increased risk of exposure to COVID-19 virus  Z91.89    >15 minutes spent in face to face time with patient, >50% spent in counselling or coordination of care   Treat presumptively for bronchitis with bronchspasm. Check Covid-19 testing.  I discussed the assessment and treatment plan with the patient. The patient was provided an opportunity to ask questions and all were answered. The patient agreed with the plan and demonstrated an understanding of the instructions.   The patient was advised to call back or seek an in-person evaluation if the symptoms worsen or if the condition fails to improve as anticipated.  Follow-up: prn unless noted otherwise below No follow-ups on file.  Meds ordered this encounter  Medications   doxycycline (VIBRA-TABS) 100 MG tablet    Sig: Take 1 tablet (100 mg total) by mouth 2 (two) times daily  for 10 days.    Dispense:  20 tablet    Refill:  0   predniSONE (DELTASONE) 20 MG tablet    Sig: 2 tabs po daily for 5 days, then 1 tab po daily for 5 days    Dispense:  15 tablet    Refill:  0   No orders of the defined types were placed in this encounter.   Signed,  [name]. Jolisa Intriago, MD

## 2019-05-23 ENCOUNTER — Telehealth: Payer: Self-pay

## 2019-05-23 MED ORDER — PREDNISONE 20 MG PO TABS: ORAL_TABLET | ORAL | 0 refills | Status: DC

## 2019-05-23 MED ORDER — DOXYCYCLINE HYCLATE 100 MG PO TABS: 100.0000 mg | ORAL_TABLET | Freq: Two times a day (BID) | ORAL | 0 refills | Status: DC

## 2019-05-23 NOTE — Telephone Encounter (Signed)
Steven Kemp notified as instructed by telephone.  Advised that Dr. Lorelei Pont has sent him a prescription for prednisone and doxycycline.  Patient states understanding.

## 2019-05-23 NOTE — Telephone Encounter (Signed)
Please apologize for me - This is entirely my fault and I forgot to do it before he left the office

## 2019-05-23 NOTE — Telephone Encounter (Signed)
Patient was seen yesterday for a virtual visit. Patient states Dr. Lorelei Pont advised he would send in Prednisone, and one other medication (patient is not sure which medication) - but when he went to the pharmacy, they did not have anything for him. I reviewed the office note and I do not see which medications were to be sent in. Dr. Lorelei Pont, could you advise on this? Thank you!

## 2019-05-23 NOTE — Addendum Note (Signed)
Addended by: Owens Loffler on: 05/23/2019 10:42 AM   Modules accepted: Orders

## 2019-06-02 ENCOUNTER — Other Ambulatory Visit: Payer: Self-pay | Admitting: Interventional Cardiology

## 2019-06-09 ENCOUNTER — Other Ambulatory Visit: Payer: Self-pay | Admitting: Interventional Cardiology

## 2019-06-14 ENCOUNTER — Other Ambulatory Visit: Payer: Self-pay | Admitting: Family Medicine

## 2019-06-14 NOTE — Telephone Encounter (Signed)
Last office visit 05/22/2019 for wheezing.  Last refilled Prednisone 05/23/2019 for #15 with no refills.  Doxycycline not on current medication list.  No future appointments

## 2019-06-14 NOTE — Telephone Encounter (Signed)
Shirlean Mylar,   Can you try and get Mr. Navratil to do a virtual visit with Dr. Lorelei Pont today.

## 2019-06-14 NOTE — Telephone Encounter (Signed)
He has enough medical problems that I would really like to at least talk to him on the phone.  See if he can do a video or telephone visit today if possible.  If he cannot, then let him know that I will call him by telephone sometime after 4 this afternoon.

## 2019-06-15 ENCOUNTER — Encounter: Payer: Self-pay | Admitting: Family Medicine

## 2019-06-15 ENCOUNTER — Ambulatory Visit (INDEPENDENT_AMBULATORY_CARE_PROVIDER_SITE_OTHER): Payer: Commercial Managed Care - PPO | Admitting: Family Medicine

## 2019-06-15 VITALS — Ht 68.0 in

## 2019-06-15 DIAGNOSIS — R05 Cough: Secondary | ICD-10-CM

## 2019-06-15 DIAGNOSIS — R059 Cough, unspecified: Secondary | ICD-10-CM

## 2019-06-15 MED ORDER — ALBUTEROL SULFATE HFA 108 (90 BASE) MCG/ACT IN AERS: 2.0000 | INHALATION_SPRAY | Freq: Four times a day (QID) | RESPIRATORY_TRACT | 0 refills | Status: DC | PRN

## 2019-06-15 NOTE — Progress Notes (Signed)
VIRTUAL VISIT Due to national recommendations of social distancing due to [place] 19, a virtual visit is felt to be most appropriate for this patient at this time.   I connected with the patient on 06/15/19 at 12:00 PM EST by virtual telehealth platform and verified that I am speaking with the correct person using two identifiers.   I discussed the limitations, risks, security and privacy concerns of performing an evaluation and management service by  virtual telehealth platform and the availability of in person appointments. I also discussed with the patient that there may be a patient responsible charge related to this service. The patient expressed understanding and agreed to proceed.  Patient location: Home Provider Location: [facility] Participants: Steven Kemp and Dara Lords   Chief Complaint  Patient presents with  . Wheezing    Seen by Dr. Lorelei Pont on 05/22/2019 and treated with Prednison and Doxy.  Patient requested refill yesterday  . Cough    History of Present Illness: 53 year old male seen virtually by PCP on  05/22/2019 Felt possible bronchitis. Sent for covid testing. He did not go. Treated with doxy and prednisone.   He reports that his symptoms improved significantly then in last week symptoms returned. Productive cough, wheezing. No significant SOB.No fever, ST, ear pain, no face pain. Achiness in chest when cough. No exertional chest pain.  No chronic lung issue. Hx/CAD   Using mucinex DM.  COVID 19 screen No recent travel or known exposure to COVID19 The patient denies respiratory symptoms of COVID 19 at this time.  The importance of social distancing was discussed today.   Review of Systems  Constitutional: Negative for chills and fever.  HENT: Negative for congestion and ear pain.   Eyes: Negative for pain and redness.  Respiratory: Positive for cough and wheezing. Negative for shortness of breath.   Cardiovascular: Negative for chest  pain, palpitations and leg swelling.  Gastrointestinal: Negative for abdominal pain, blood in stool, constipation, diarrhea, nausea and vomiting.  Genitourinary: Negative for dysuria.  Musculoskeletal: Negative for falls and myalgias.  Skin: Negative for rash.  Neurological: Negative for dizziness.  Psychiatric/Behavioral: Negative for depression. The patient is not nervous/anxious.       Past Medical History:  Diagnosis Date  . Borderline diabetes   . CAD (coronary artery disease), native coronary artery    a. STEMI 10/22/2016 s/p DES to the first diagonal with residual moderate disease in the LAD, right PDA and distal LAD.  Marland Kitchen Hyperlipidemia   . Hypertension     reports that he has never smoked. He has never used smokeless tobacco. He reports current alcohol use. He reports that he does not use drugs.   Current Outpatient Medications:  .  atorvastatin (LIPITOR) 80 MG tablet, TAKE 1 TABLET (80 MG TOTAL) BY MOUTH DAILY AT 6 PM., Disp: 90 tablet, Rfl: 2 .  BRILINTA 90 MG TABS tablet, TAKE 1 TABLET (90 MG TOTAL) BY MOUTH EVERY 12 (TWELVE) HOURS, Disp: 60 tablet, Rfl: 6 .  lisinopril (ZESTRIL) 20 MG tablet, TAKE 1 TABLET BY MOUTH EVERY DAY, Disp: 90 tablet, Rfl: 3 .  metoprolol tartrate (LOPRESSOR) 25 MG tablet, Take 0.5 tablets (12.5 mg total) by mouth 2 (two) times daily., Disp: 30 tablet, Rfl: 0 .  nitroGLYCERIN (NITROSTAT) 0.4 MG SL tablet, Place 1 tablet (0.4 mg total) under the tongue every 5 (five) minutes as needed., Disp: 25 tablet, Rfl: 0 .  sildenafil (REVATIO) 20 MG tablet, TAKE 2 TO 5 TABLETS (40-100MG )  BY MOUTH 30 MINUTES PRIOR TO INTERCOURSE AS DIRECTED, Disp: 100 tablet, Rfl: 5 .  doxycycline (VIBRA-TABS) 100 MG tablet, TAKE 1 TABLET BY MOUTH TWICE A DAY FOR 10 DAYS, Disp: 20 tablet, Rfl: 0 .  predniSONE (DELTASONE) 20 MG tablet, TAKE 2 TABS DAILY FOR 5 DAYS, THEN 1 TAB DAILY FOR 5 DAYS, Disp: 15 tablet, Rfl: 0   Observations/Objective: Height 5\' 8"  (1.727 m).  Physical Exam   Physical Exam Constitutional:      General: The patient is not in acute distress. Pulmonary:     Effort: Pulmonary effort is normal. No respiratory distress.  Neurological:     Mental Status: The patient is alert and oriented to person, place, and time.  Psychiatric:        Mood and Affect: Mood normal.        Behavior: Behavior normal.   Assessment and Plan   Coughing Repeat course of prednisone and doxy. If not improving he will need to be seen in person. Go to ER of Severe shortness of breath.  Use albuterol prn.   I discussed the assessment and treatment plan with the patient. The patient was provided an opportunity to ask questions and all were answered. The patient agreed with the plan and demonstrated an understanding of the instructions.   The patient was advised to call back or seek an in-person evaluation if the symptoms worsen or if the condition fails to improve as anticipated.     Steven Lofts, MD

## 2019-06-15 NOTE — Telephone Encounter (Signed)
Virtual appointment with Dr Diona Browner 12/24 @ 12

## 2019-07-03 ENCOUNTER — Other Ambulatory Visit: Payer: Self-pay | Admitting: Family Medicine

## 2019-07-06 ENCOUNTER — Other Ambulatory Visit: Payer: Self-pay | Admitting: Interventional Cardiology

## 2019-07-17 DIAGNOSIS — R05 Cough: Secondary | ICD-10-CM | POA: Insufficient documentation

## 2019-07-17 DIAGNOSIS — R059 Cough, unspecified: Secondary | ICD-10-CM | POA: Insufficient documentation

## 2019-07-17 NOTE — Assessment & Plan Note (Signed)
Repeat course of prednisone and doxy. If not improving he will need to be seen in person. Go to ER of Severe shortness of breath.  Use albuterol prn.

## 2019-07-25 ENCOUNTER — Ambulatory Visit: Payer: Commercial Managed Care - PPO | Admitting: Interventional Cardiology

## 2019-07-31 NOTE — Progress Notes (Signed)
Cardiology Office Note   Date:  08/01/2019   ID:  Steven Kemp, DOB [birth year], MRN [id number]  PCP:  Steven Loffler, MD    No chief complaint on file.  CAD/Old MI  Wt Readings from Last 3 Encounters:  08/01/19 177 lb 6.4 oz (80.5 kg)  03/16/19 174 lb (78.9 kg)  03/06/19 174 lb 3.2 oz (79 kg)       History of Present Illness: Steven Kemp is a 54 y.o. male  with h/o HTN, HLD and borderline DM, who presented to [facility] on 10/22/16 with CC of substernal chest and left arm pain with diaphoresis. He was brought in by EMS. On EMS arrival,EKG showed acute changes in the inferior lateral leads. Code STEMI was called. He was given 324 mg of ASAand 1 SL nitro with resolution of pain. He was brought to the cath lab emergently for cardiac catheterization.   Emergent LHC was performed and he was found to have an occluded 1st diagonal vessel. S/p DES x1 to 1st Diag with residualmoderate disease in the LAD, right PDA and distal LAD. LV gram showed preserved LV function. He was continued on IV tirofiban for 2 hours post cath. Plan isfor DAPT with ASA/Brilinta for at least one year. His statin was increased to Lipitor 80mg  and hestarted on metoprolol 12.5mg  BID. His home lisinopril was restarted prior to discharge.    Cath in 5/18 showed:  Ost RPDA to RPDA lesion, 80 %stenosed. Bifurcation of PDA /PLA occurs more proximally.  Prox Cx lesion, 50 %stenosed.  Prox LAD to Mid LAD lesion, 25 %stenosed.  Dist LAD lesion, 90 %stenosed. This is at the apex and not amenable to PCI.  1st Diag lesion, 95 %stenosed. A STENT PROMUS PREM MR 2.25X16 drug eluting stent was successfully placed, postdilated to > 2.5 mm.  Post intervention, there is a 0% residual stenosis.  The left ventricular systolic function is normal.  LV end diastolic pressure is normal.  The left ventricular ejection fraction is 50-55% by visual estimate. There is no aortic valve stenosis.  In 2019, he was  planning a vasectomy.  He was changed to clopidogrel monotherapy due to a fair amount of diffuse, small vessel CAD.  Sin the last visit, he has gained some weight with COVID.   Exercise dropped off, but he is trying to get more exercise.  Denies : Chest pain. Dizziness. Leg edema. Nitroglycerin use. Orthopnea. Palpitations. Paroxysmal nocturnal dyspnea. Shortness of breath. Syncope.   Past Medical History:  Diagnosis Date  . Borderline diabetes   . CAD (coronary artery disease), native coronary artery    a. STEMI 10/22/2016 s/p DES to the first diagonal with residual moderate disease in the LAD, right PDA and distal LAD.  Marland Kitchen Hyperlipidemia   . Hypertension     Past Surgical History:  Procedure Laterality Date  . CORONARY STENT INTERVENTION N/A 10/22/2016   Procedure: Coronary Stent Intervention;  Surgeon: Jettie Booze, MD;  Location: [place] [facility];  Service: Cardiovascular;  Laterality: N/A;  . LEFT HEART CATH AND CORONARY ANGIOGRAPHY N/A 10/22/2016   Procedure: Left Heart Cath and Coronary Angiography;  Surgeon: Jettie Booze, MD;  Location: [place] [facility];  Service: Cardiovascular;  Laterality: N/A;     Current Outpatient Medications  Medication Sig Dispense Refill  . albuterol (VENTOLIN HFA) 108 (90 Base) MCG/ACT inhaler TAKE 2 PUFFS BY MOUTH EVERY 6 HOURS AS NEEDED FOR WHEEZE OR SHORTNESS OF BREATH 6.7 g 2  . atorvastatin (LIPITOR)  80 MG tablet TAKE 1 TABLET (80 MG TOTAL) BY MOUTH DAILY AT 6 PM. 90 tablet 2  . BRILINTA 90 MG TABS tablet TAKE 1 TABLET (90 MG TOTAL) BY MOUTH EVERY 12 (TWELVE) HOURS 60 tablet 6  . lisinopril (ZESTRIL) 20 MG tablet TAKE 1 TABLET BY MOUTH EVERY DAY 90 tablet 3  . metoprolol tartrate (LOPRESSOR) 25 MG tablet Take 0.5 tablets (12.5 mg total) by mouth 2 (two) times daily. Pt will need to make appt by May, 2021 for further refills 90 tablet 0  . nitroGLYCERIN (NITROSTAT) 0.4 MG SL tablet Place 1 tablet (0.4 mg total) under the tongue every  5 (five) minutes as needed. 25 tablet 0  . sildenafil (REVATIO) 20 MG tablet TAKE 2 TO 5 TABLETS (40-100MG ) BY MOUTH 30 MINUTES PRIOR TO INTERCOURSE AS DIRECTED 100 tablet 5   No current facility-administered medications for this visit.    Allergies:   Patient has no known allergies.    Social History:  The patient  reports that he has never smoked. He has never used smokeless tobacco. He reports current alcohol use. He reports that he does not use drugs.   Family History:  The patient's family history includes Hypertension in his father.    ROS:  Please see the history of present illness.   Otherwise, review of systems are positive for less exercise with COVID.   All other systems are reviewed and negative.    PHYSICAL EXAM: VS:  BP 120/80   Pulse 67   Ht 5\' 8"  (1.727 m)   Wt 177 lb 6.4 oz (80.5 kg)   SpO2 97%   BMI 26.97 kg/m  , BMI Body mass index is 26.97 kg/m. GEN: Well nourished, well developed, in no acute distress  HEENT: normal  Neck: no JVD, carotid bruits, or masses Cardiac: RRR; no murmurs, rubs, or gallops,no edema  Respiratory:  clear to auscultation bilaterally, normal work of breathing GI: soft, nontender, nondistended, + BS MS: no deformity or atrophy  Skin: warm and dry, no rash Neuro:  Strength and sensation are intact Psych: euthymic mood, full affect   EKG:   The ekg ordered today demonstrates NSR, no ST changes   Recent Labs: 02/06/2019: ALT 31; BUN 11; Creatinine, Ser 0.98; Hemoglobin 15.6; Platelets 176; Potassium 4.3; Sodium 138   Lipid Panel    Component Value Date/Time   CHOL 97 (L) 02/06/2019 0759   TRIG 62 02/06/2019 0759   HDL 34 (L) 02/06/2019 0759   CHOLHDL 2.9 02/06/2019 0759   CHOLHDL 3 01/19/2019 0841   VLDL 11.6 01/19/2019 0841   LDLCALC 51 02/06/2019 0759   LDLDIRECT 103.0 06/18/2015 1603     Other studies Reviewed: Additional studies/ records that were reviewed today with results demonstrating: labs  reviewed.   ASSESSMENT AND PLAN:  1. CAD/Old MI: Residual, small vessel disease present.  This has been managed with medical therapy.  He has improved his diet.  Watch for any signs of heart failure, fluid overload.  CLopidogrel monotherapy.  2. Hyperlipidemia: Continue high-dose statin. 3. Hypertension: The current medical regimen is effective;  continue present plan and medications. 4. Borderline diabetes: This improved with lifestyle changes.  A1C 6.1 in 12/2018.   Current medicines are reviewed at length with the patient today.  The patient concerns regarding his medicines were addressed.  The following changes have been made:  No change  Labs/ tests ordered today include:   Orders Placed This Encounter  Procedures  . EKG 12-Lead  Recommend 150 minutes/week of aerobic exercise Low fat, low carb, high fiber diet recommended  Disposition:   FU in 1 year   Signed, Larae Grooms, MD  08/01/2019 [facility] [place], [place], [place]  [address] Phone: [phone]; Fax: [phone]

## 2019-08-01 ENCOUNTER — Ambulatory Visit: Payer: Commercial Managed Care - PPO | Admitting: Interventional Cardiology

## 2019-08-01 ENCOUNTER — Other Ambulatory Visit: Payer: Self-pay

## 2019-08-01 ENCOUNTER — Encounter: Payer: Self-pay | Admitting: Interventional Cardiology

## 2019-08-01 VITALS — BP 120/80 | HR 67 | Ht 68.0 in | Wt 177.4 lb

## 2019-08-01 DIAGNOSIS — R7303 Prediabetes: Secondary | ICD-10-CM

## 2019-08-01 DIAGNOSIS — E785 Hyperlipidemia, unspecified: Secondary | ICD-10-CM

## 2019-08-01 DIAGNOSIS — I10 Essential (primary) hypertension: Secondary | ICD-10-CM | POA: Diagnosis not present

## 2019-08-01 DIAGNOSIS — I2510 Atherosclerotic heart disease of native coronary artery without angina pectoris: Secondary | ICD-10-CM

## 2019-08-01 NOTE — Patient Instructions (Signed)

## 2019-09-23 ENCOUNTER — Other Ambulatory Visit: Payer: Self-pay | Admitting: Physician Assistant

## 2019-10-09 ENCOUNTER — Other Ambulatory Visit: Payer: Self-pay | Admitting: Interventional Cardiology

## 2019-10-28 ENCOUNTER — Other Ambulatory Visit: Payer: Self-pay | Admitting: Physician Assistant

## 2019-12-22 ENCOUNTER — Other Ambulatory Visit: Payer: Self-pay | Admitting: Interventional Cardiology

## 2020-05-07 ENCOUNTER — Other Ambulatory Visit: Payer: Self-pay | Admitting: Interventional Cardiology

## 2020-05-29 ENCOUNTER — Other Ambulatory Visit: Payer: Self-pay | Admitting: Physician Assistant

## 2020-07-18 ENCOUNTER — Other Ambulatory Visit: Payer: Self-pay | Admitting: Interventional Cardiology

## 2020-08-13 ENCOUNTER — Other Ambulatory Visit: Payer: Self-pay | Admitting: Interventional Cardiology

## 2020-08-24 ENCOUNTER — Other Ambulatory Visit: Payer: Self-pay | Admitting: Interventional Cardiology

## 2020-09-09 ENCOUNTER — Other Ambulatory Visit: Payer: Self-pay | Admitting: Interventional Cardiology

## 2020-09-10 ENCOUNTER — Other Ambulatory Visit: Payer: Self-pay | Admitting: Interventional Cardiology

## 2020-09-12 ENCOUNTER — Other Ambulatory Visit: Payer: Self-pay | Admitting: Interventional Cardiology

## 2020-09-30 ENCOUNTER — Emergency Department (HOSPITAL_COMMUNITY)
Admission: EM | Admit: 2020-09-30 | Discharge: 2020-09-30 | Disposition: A | Discharge status: Home / Self Care | Payer: BC Managed Care – PPO | Attending: Emergency Medicine | Admitting: Emergency Medicine

## 2020-09-30 ENCOUNTER — Other Ambulatory Visit: Payer: Self-pay

## 2020-09-30 ENCOUNTER — Emergency Department (HOSPITAL_COMMUNITY): Payer: BC Managed Care – PPO

## 2020-09-30 DIAGNOSIS — R0789 Other chest pain: Secondary | ICD-10-CM | POA: Diagnosis not present

## 2020-09-30 DIAGNOSIS — I10 Essential (primary) hypertension: Secondary | ICD-10-CM | POA: Diagnosis not present

## 2020-09-30 DIAGNOSIS — I2510 Atherosclerotic heart disease of native coronary artery without angina pectoris: Secondary | ICD-10-CM | POA: Insufficient documentation

## 2020-09-30 DIAGNOSIS — R202 Paresthesia of skin: Secondary | ICD-10-CM | POA: Diagnosis not present

## 2020-09-30 DIAGNOSIS — Z79899 Other long term (current) drug therapy: Secondary | ICD-10-CM | POA: Diagnosis not present

## 2020-09-30 LAB — BASIC METABOLIC PANEL
Anion gap: 8 (ref 5–15)
BUN: 12 mg/dL (ref 6–20)
CO2: 22 mmol/L (ref 22–32)
Calcium: 9 mg/dL (ref 8.9–10.3)
Chloride: 105 mmol/L (ref 98–111)
Creatinine, Ser: 0.85 mg/dL (ref 0.61–1.24)
GFR, Estimated: >60 mL/min (ref >60)
Glucose, Bld: 97 mg/dL (ref 70–99)
Potassium: 4.3 mmol/L (ref 3.5–5.1)
Sodium: 135 mmol/L (ref 135–145)

## 2020-09-30 LAB — CBC WITH DIFFERENTIAL/PLATELET
Abs Immature Granulocytes: 0.02 10*3/uL (ref 0.00–0.07)
Basophils Absolute: 0 10*3/uL (ref 0.0–0.1)
Basophils Relative: 0 %
Eosinophils Absolute: 0.2 10*3/uL (ref 0.0–0.5)
Eosinophils Relative: 2 %
HCT: 45.6 % (ref 39.0–52.0)
Hemoglobin: 15.6 g/dL (ref 13.0–17.0)
Immature Granulocytes: 0 %
Lymphocytes Relative: 26 %
Lymphs Abs: 1.9 10*3/uL (ref 0.7–4.0)
MCH: 30 pg (ref 26.0–34.0)
MCHC: 34.2 g/dL (ref 30.0–36.0)
MCV: 87.7 fL (ref 80.0–100.0)
Monocytes Absolute: 0.6 10*3/uL (ref 0.1–1.0)
Monocytes Relative: 8 %
Neutro Abs: 4.7 10*3/uL (ref 1.7–7.7)
Neutrophils Relative %: 64 %
Platelets: 213 10*3/uL (ref 150–400)
RBC: 5.2 MIL/uL (ref 4.22–5.81)
RDW: 12 % (ref 11.5–15.5)
WBC: 7.4 10*3/uL (ref 4.0–10.5)
nRBC: 0 % (ref 0.0–0.2)

## 2020-09-30 LAB — TROPONIN I (HIGH SENSITIVITY)
Troponin I (High Sensitivity): 5 ng/L (ref <18)
Troponin I (High Sensitivity): 4 ng/L (ref <18)

## 2020-09-30 NOTE — ED Triage Notes (Signed)
Emergency Medicine Provider Triage Evaluation Note  Steven Kemp , a 55 y.o. male  was evaluated in triage.  Pt complains of chest pain.  Patient states he has a history of MI.  Unsure if this feels similar.  He was seen by [facility].  Patient states he just felt "off today."  He felt he had some "twinges" to his left chest.  States he intermittently felt some tingling into his left arm.  He did have this when he had his prior MI.  Pain is worse with movement.  He does not have any exertional chest pain.  Denies any unilateral leg swelling, redness or warmth.  Described as an aching.  He has no current chest pain. No facial droop, difficulty with word finding   Review of Systems  Positive: Chest pain Negative: Nausea, vomiting, headache, weakness  Physical Exam  BP (!) 123/98   Pulse 72   Temp 98.2 F (36.8 C) (Oral)   Resp 16   SpO2 95%  Gen:   Awake, no distress   HEENT:  Atraumatic  Resp:  Normal effort  Cardiac:  Normal rate  Abd:   Nondistended, nontender  MSK:   Moves extremities without difficulty  Neuro:  Speech clear   Medical Decision Making  Medically screening exam initiated at 5:48 PM.  Appropriate orders placed.  Delmas Faucett was informed that the remainder of the evaluation will be completed by another provider, this initial triage assessment does not replace that evaluation, and the importance of remaining in the ED until their evaluation is complete.  Clinical Impression  Chest pain   Caiden Arteaga A, PA-C 09/30/20 1754

## 2020-09-30 NOTE — ED Triage Notes (Signed)
C/O intermittent chest pain and left arm numbness while at work today, endorsed prior MI. ASA 324 mg given PTA

## 2020-09-30 NOTE — ED Provider Notes (Signed)
[place] EMERGENCY DEPARTMENT Provider Note   CSN: [id number] Arrival date & time: 09/30/20  1744     History Chief Complaint  Patient presents with  . Chest Pain    Steven Kemp is a 55 y.o. male w PMHx HTN, HLD, CAD s/p DES, presenting for evaluation of chest pain that began this morning.  Patient states pain is to the left pectoral region brought on by movement of his torso or arms.  Tingling in arm/shoulder.  Pulls on a heavy safe door at the [employer], his place of employment, feels like he pulled a muscle. Considering his CAD hx, was concerned for cardiac cause and therefore treated with full dose ASA and presents for evaluation. No assoc SOB, nausea, diaphoresis.  Does not feel similar to prior MI.  Compliant on Brilinta.  Patient is followed by Dr. Irish Lack with cardiology.  He had normal stress test in 2019 following stent placement in 2018.    The history is provided by the patient.       Past Medical History:  Diagnosis Date  . Borderline diabetes   . CAD (coronary artery disease), native coronary artery    a. STEMI 10/22/2016 s/p DES to the first diagonal with residual moderate disease in the LAD, right PDA and distal LAD.  Marland Kitchen Hyperlipidemia   . Hypertension     Patient Active Problem List   Diagnosis Date Noted  . Coughing 07/17/2019  . CAD (coronary artery disease) 12/28/2016  . Acute MI, lateral wall ([place])   . Borderline diabetes 09/08/2013  . BPPV (benign paroxysmal positional vertigo) 02/10/2013  . Hyperlipidemia LDL goal <70 10/09/2009  . Essential hypertension 10/09/2009  . EXTERNAL HEMORRHOIDS WITHOUT MENTION COMP 07/24/2009  . ALLERGIC  RHINITIS 02/03/2007    Past Surgical History:  Procedure Laterality Date  . CORONARY STENT INTERVENTION N/A 10/22/2016   Procedure: Coronary Stent Intervention;  Surgeon: Jettie Booze, MD;  Location: [place] [facility];  Service: Cardiovascular;  Laterality: N/A;  . LEFT HEART CATH AND  CORONARY ANGIOGRAPHY N/A 10/22/2016   Procedure: Left Heart Cath and Coronary Angiography;  Surgeon: Jettie Booze, MD;  Location: [place] [facility];  Service: Cardiovascular;  Laterality: N/A;       Family History  Problem Relation Age of Onset  . Hypertension Father   . Colon cancer Neg Hx   . Esophageal cancer Neg Hx   . Rectal cancer Neg Hx   . Stomach cancer Neg Hx     Social History   Tobacco Use  . Smoking status: Never Smoker  . Smokeless tobacco: Never Used  Vaping Use  . Vaping Use: Never used  Substance Use Topics  . Alcohol use: Yes    Comment: very rare  . Drug use: No    Home Medications Prior to Admission medications   Medication Sig Start Date End Date Taking? Authorizing Provider  albuterol (VENTOLIN HFA) 108 (90 Base) MCG/ACT inhaler TAKE 2 PUFFS BY MOUTH EVERY 6 HOURS AS NEEDED FOR WHEEZE OR SHORTNESS OF BREATH 07/03/19   Copland, Frederico Hamman, MD  atorvastatin (LIPITOR) 80 MG tablet TAKE 1 TABLET BY MOUTH DAILY AT 6 PM. 09/09/20   Jettie Booze, MD  BRILINTA 90 MG TABS tablet TAKE 1 TABLET BY MOUTH TWICE A DAY MUST HAVE OV FOR REFILL 09/10/20   Jettie Booze, MD  lisinopril (ZESTRIL) 20 MG tablet TAKE 1 TABLET BY MOUTH EVERY DAY 09/13/20   Jettie Booze, MD  metoprolol tartrate (LOPRESSOR)  25 MG tablet TAKE 1/2 TABLET BY MOUTH 2 TIMES DAILY. 05/07/20   Jettie Booze, MD  nitroGLYCERIN (NITROSTAT) 0.4 MG SL tablet PLACE 1 TABLET (0.4 MG TOTAL) UNDER THE TONGUE EVERY 5 (FIVE) MINUTES AS NEEDED. 09/25/19   Jettie Booze, MD  sildenafil (REVATIO) 20 MG tablet TAKE 2 TO 5 TABLETS (40-100MG ) BY MOUTH 30 MINUTES PRIOR TO INTERCOURSE AS DIRECTED 01/31/18   Copland, Frederico Hamman, MD    Allergies    Patient has no known allergies.  Review of Systems   Review of Systems  Cardiovascular: Positive for chest pain.  All other systems reviewed and are negative.   Physical Exam Updated Vital Signs BP 117/80   Pulse (!) 50   Temp (!) 97.5  F (36.4 C) (Oral)   Resp 13   SpO2 98%   Physical Exam Vitals and nursing note reviewed.  Constitutional:      General: He is not in acute distress.    Appearance: He is well-developed. He is not ill-appearing.  HENT:     Head: Normocephalic and atraumatic.  Eyes:     Conjunctiva/sclera: Conjunctivae normal.  Cardiovascular:     Rate and Rhythm: Normal rate and regular rhythm.  Pulmonary:     Effort: Pulmonary effort is normal. No respiratory distress.     Breath sounds: Normal breath sounds.  Chest:     Chest wall: No tenderness.  Abdominal:     General: Bowel sounds are normal.     Palpations: Abdomen is soft.     Tenderness: There is no abdominal tenderness.  Musculoskeletal:     Right lower leg: No edema.     Left lower leg: No edema.  Skin:    General: Skin is warm.  Neurological:     Mental Status: He is alert.  Psychiatric:        Behavior: Behavior normal.     ED Results / Procedures / Treatments   Labs (all labs ordered are listed, but only abnormal results are displayed) Labs Reviewed  CBC WITH DIFFERENTIAL/PLATELET  BASIC METABOLIC PANEL  TROPONIN I (HIGH SENSITIVITY)  TROPONIN I (HIGH SENSITIVITY)    EKG EKG Interpretation  Date/Time:  Monday September 30 2020 17:48:21 EDT Ventricular Rate:  61 PR Interval:  146 QRS Duration: 94 QT Interval:  362 QTC Calculation: 364 R Axis:   121 Text Interpretation: Normal sinus rhythm Right axis deviation Septal infarct , age undetermined Abnormal ECG Confirmed by Lacretia Leigh ([id number]) on 09/30/2020 10:58:25 PM   Radiology DG Chest 2 View  Result Date: 09/30/2020 CLINICAL DATA:  Left-sided chest pain and left arm tingling. EXAM: CHEST - 2 VIEW COMPARISON:  None. FINDINGS: The heart size and mediastinal contours are within normal limits. Both lungs are clear. The visualized skeletal structures are unremarkable. IMPRESSION: No active cardiopulmonary disease. Electronically Signed   By: Virgina Norfolk M.D.    On: 09/30/2020 19:22    Procedures Procedures   Medications Ordered in ED Medications - No data to display  ED Course  I have reviewed the triage vital signs and the nursing notes.  Pertinent labs & imaging results that were available during my care of the patient were reviewed by me and considered in my medical decision making (see chart for details).    MDM Rules/Calculators/A&P                          Patient with history of MI status post stenting in 2018,  followed by normal stress test in 2019, presenting for evaluation of left-sided chest pain that began today.  Chest pain feels like a pulled muscle, is only brought on when he does particular movements of his torso and arms.  It has since resolved.  No associated symptoms other than some radiation towards his left arm.  He is very well-appearing in no distress.  EKG is nonischemic.  Troponins x2 are flat and negative.  Metabolic panel and CBC are also within normal limits.  X-ray is negative.  Patient is most consistent with chest wall pain.  He is followed by Dr. Irish Lack and seems reliable for follow-up.  Hear score of 4, believe patient stable for discharge to home with close follow-up with his cardiologist.  Strict return precautions.  Discussed patient with Dr. Zenia Resides, who agrees with workup and care plan.  Final Clinical Impression(s) / ED Diagnoses Final diagnoses:  Chest wall pain    Rx / DC Orders ED Discharge Orders    None       Jerome Otter, [place] N, PA-C 09/30/20 2258    Lacretia Leigh, MD 10/03/20 1115

## 2020-09-30 NOTE — Discharge Instructions (Addendum)
Please read instructions below. Follow up with your cardiologist in 2 days.  Return to the ER for new or worsening symptoms; including worsening chest pain, shortness of breath, pain that radiates to the arm or neck, pain or shortness of breath worsened with exertion.

## 2020-10-04 ENCOUNTER — Other Ambulatory Visit: Payer: Self-pay | Admitting: Interventional Cardiology

## 2020-10-07 ENCOUNTER — Telehealth: Payer: Self-pay | Admitting: Interventional Cardiology

## 2020-10-07 ENCOUNTER — Other Ambulatory Visit: Payer: Self-pay | Admitting: Interventional Cardiology

## 2020-10-07 MED ORDER — TICAGRELOR 90 MG PO TABS: ORAL_TABLET | ORAL | 0 refills | Status: DC

## 2020-10-07 NOTE — Telephone Encounter (Signed)
*  STAT* If patient is at the pharmacy, call can be transferred to refill team.   1. Which medications need to be refilled? (please list name of each medication and dose if known) Brilinta   2. Which pharmacy/location (including street and city if local pharmacy) is medication to be sent to? [facility]   3. Do they need a 30 day or 90 day supply? 90 patient has made a appt.

## 2020-10-07 NOTE — Telephone Encounter (Signed)
Pt's medication was sent to pt's pharmacy as requested. Confirmation received.  °

## 2020-10-28 ENCOUNTER — Encounter (HOSPITAL_COMMUNITY): Payer: Self-pay | Admitting: Pharmacy Technician

## 2020-10-28 ENCOUNTER — Telehealth: Payer: Self-pay | Admitting: Interventional Cardiology

## 2020-10-28 ENCOUNTER — Other Ambulatory Visit: Payer: Self-pay

## 2020-10-28 ENCOUNTER — Emergency Department (HOSPITAL_COMMUNITY): Payer: BC Managed Care – PPO

## 2020-10-28 ENCOUNTER — Emergency Department (HOSPITAL_COMMUNITY)
Admission: EM | Admit: 2020-10-28 | Discharge: 2020-10-28 | Disposition: A | Discharge status: Home / Self Care | Payer: BC Managed Care – PPO | Attending: Emergency Medicine | Admitting: Emergency Medicine

## 2020-10-28 DIAGNOSIS — R079 Chest pain, unspecified: Secondary | ICD-10-CM

## 2020-10-28 DIAGNOSIS — I10 Essential (primary) hypertension: Secondary | ICD-10-CM | POA: Diagnosis not present

## 2020-10-28 DIAGNOSIS — Z79899 Other long term (current) drug therapy: Secondary | ICD-10-CM | POA: Diagnosis not present

## 2020-10-28 DIAGNOSIS — I2510 Atherosclerotic heart disease of native coronary artery without angina pectoris: Secondary | ICD-10-CM | POA: Insufficient documentation

## 2020-10-28 LAB — BASIC METABOLIC PANEL
Anion gap: 6 (ref 5–15)
BUN: 12 mg/dL (ref 6–20)
CO2: 26 mmol/L (ref 22–32)
Calcium: 9.1 mg/dL (ref 8.9–10.3)
Chloride: 105 mmol/L (ref 98–111)
Creatinine, Ser: 1.01 mg/dL (ref 0.61–1.24)
GFR, Estimated: >60 mL/min (ref >60)
Glucose, Bld: 107 mg/dL — ABNORMAL HIGH (ref 70–99)
Potassium: 3.7 mmol/L (ref 3.5–5.1)
Sodium: 137 mmol/L (ref 135–145)

## 2020-10-28 LAB — CBC
HCT: 45.5 % (ref 39.0–52.0)
Hemoglobin: 15.3 g/dL (ref 13.0–17.0)
MCH: 28.9 pg (ref 26.0–34.0)
MCHC: 33.6 g/dL (ref 30.0–36.0)
MCV: 86 fL (ref 80.0–100.0)
Platelets: 188 10*3/uL (ref 150–400)
RBC: 5.29 MIL/uL (ref 4.22–5.81)
RDW: 12.2 % (ref 11.5–15.5)
WBC: 7 10*3/uL (ref 4.0–10.5)
nRBC: 0 % (ref 0.0–0.2)

## 2020-10-28 LAB — TROPONIN I (HIGH SENSITIVITY)
Troponin I (High Sensitivity): 3 ng/L (ref <18)
Troponin I (High Sensitivity): 3 ng/L (ref <18)

## 2020-10-28 MED ORDER — ASPIRIN 81 MG PO CHEW
324.0000 mg | CHEWABLE_TABLET | Freq: Once | ORAL | Status: AC
  Administered 2020-10-28: 324 mg via ORAL
  Filled 2020-10-28: qty 4

## 2020-10-28 MED ORDER — TICAGRELOR 90 MG PO TABS
90.0000 mg | ORAL_TABLET | Freq: Once | ORAL | Status: AC
  Administered 2020-10-28: 90 mg via ORAL
  Filled 2020-10-28: qty 1

## 2020-10-28 NOTE — ED Provider Notes (Addendum)
[facility] EMERGENCY DEPARTMENT Provider Note   CSN: [id number] Arrival date & time: 10/28/20  1554     History Chief Complaint  Patient presents with  . Chest Pain    Steven Kemp is a 55 y.o. male.  HPI   This patient is a 55 year old male, he presents to the hospital with a complaint of chest pain, this is been left-sided with radiation into the left arm starting last night, intermittent, has a tingling sensation going down the arm.  He is not nauseated or sweaty with it.  He does not have any shortness of breath and he is pain-free at this time, seems that this is coming and going intermittently.  He is concerned because he had a myocardial infarction with a stent about 4 years ago, taken care of by Dr. Emeterio Reeve.  The patient took 4 aspirin, he took his Brilinta this morning but has not had it this evening.  The patient denies any swelling of the legs or any other symptoms.  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  He does not smoke, he drinks the occasional wine cooler.    Past Medical History:  Diagnosis Date  . Borderline diabetes   . CAD (coronary artery disease), native coronary artery    a. STEMI 10/22/2016 s/p DES to the first diagonal with residual moderate disease in the LAD, right PDA and distal LAD.  Marland Kitchen Hyperlipidemia   . Hypertension     Patient Active Problem List   Diagnosis Date Noted  . Coughing 07/17/2019  . CAD (coronary artery disease) 12/28/2016  . Acute MI, lateral wall ([place])   . Borderline diabetes 09/08/2013  . BPPV (benign paroxysmal positional vertigo) 02/10/2013  . Hyperlipidemia LDL goal <70 10/09/2009  . Essential hypertension 10/09/2009  . EXTERNAL HEMORRHOIDS WITHOUT MENTION COMP 07/24/2009  . ALLERGIC  RHINITIS 02/03/2007    Past Surgical History:  Procedure Laterality Date  . CORONARY STENT INTERVENTION N/A 10/22/2016   Procedure: Coronary Stent Intervention;   Surgeon: Jettie Booze, MD;  Location: [facility];  Service: Cardiovascular;  Laterality: N/A;  . LEFT HEART CATH AND CORONARY ANGIOGRAPHY N/A 10/22/2016   Procedure: Left Heart Cath and Coronary Angiography;  Surgeon: Jettie Booze, MD;  Location: [facility];  Service: Cardiovascular;  Laterality: N/A;       Family History  Problem Relation Age of Onset  . Hypertension Father   . Colon cancer Neg Hx   . Esophageal cancer Neg Hx   . Rectal cancer Neg Hx   . Stomach cancer Neg Hx     Social History   Tobacco Use  . Smoking status: Never Smoker  . Smokeless tobacco: Never Used  Vaping Use  . Vaping Use: Never used  Substance Use Topics  . Alcohol use: Yes    Comment: very rare  . Drug use: No    Home Medications Prior to Admission medications   Medication Sig Start Date End Date Taking? Authorizing Provider  albuterol (VENTOLIN HFA) 108 (90 Base) MCG/ACT inhaler TAKE 2 PUFFS BY MOUTH EVERY 6 HOURS AS NEEDED FOR WHEEZE OR SHORTNESS OF BREATH 07/03/19  Yes Copland, Frederico Hamman, MD  atorvastatin (LIPITOR) 80 MG tablet Take 1 tablet (80 mg total) by mouth daily at 6 PM. Please make overdue appt with Dr. Irish Lack before anymore refills. Thank you 1st attempt 10/04/20  Yes Jettie Booze, MD  lisinopril (ZESTRIL) 20 MG tablet TAKE  1 TABLET BY MOUTH EVERY DAY Patient taking differently: Take 20 mg by mouth daily. 09/13/20  Yes Jettie Booze, MD  metoprolol tartrate (LOPRESSOR) 25 MG tablet TAKE 1/2 TABLET BY MOUTH 2 TIMES DAILY. Patient taking differently: Take 12.5 mg by mouth 2 (two) times daily. 05/07/20  Yes Jettie Booze, MD  nitroGLYCERIN (NITROSTAT) 0.4 MG SL tablet PLACE 1 TABLET (0.4 MG TOTAL) UNDER THE TONGUE EVERY 5 (FIVE) MINUTES AS NEEDED. 09/25/19  Yes Jettie Booze, MD  sildenafil (REVATIO) 20 MG tablet TAKE 2 TO 5 TABLETS (40-100MG ) BY MOUTH 30 MINUTES PRIOR TO INTERCOURSE AS DIRECTED 01/31/18  Yes Copland, Frederico Hamman, MD   ticagrelor (BRILINTA) 90 MG TABS tablet TAKE 1 TABLET BY MOUTH TWICE A DAY. Please keep upcoming appt in June 2022 with Dr. Irish Lack before anymore refills. Thank you Final Attempt 10/07/20  Yes Jettie Booze, MD    Allergies    Patient has no known allergies.  Review of Systems   Review of Systems  All other systems reviewed and are negative.   Physical Exam Updated Vital Signs BP 128/89 (BP Location: Right Arm)   Pulse (!) 56   Temp 97.8 F (36.6 C) (Oral)   Resp 19   SpO2 98%   Physical Exam Vitals and nursing note reviewed.  Constitutional:      General: He is not in acute distress.    Appearance: He is well-developed.  HENT:     Head: Normocephalic and atraumatic.     Mouth/Throat:     Pharynx: No oropharyngeal exudate.  Eyes:     General: No scleral icterus.       Right eye: No discharge.        Left eye: No discharge.     Conjunctiva/sclera: Conjunctivae normal.     Pupils: Pupils are equal, round, and reactive to light.  Neck:     Thyroid: No thyromegaly.     Vascular: No JVD.  Cardiovascular:     Rate and Rhythm: Normal rate and regular rhythm.     Heart sounds: Normal heart sounds. No murmur heard. No friction rub. No gallop.   Pulmonary:     Effort: Pulmonary effort is normal. No respiratory distress.     Breath sounds: Normal breath sounds. No wheezing or rales.  Abdominal:     General: Bowel sounds are normal. There is no distension.     Palpations: Abdomen is soft. There is no mass.     Tenderness: There is no abdominal tenderness.  Musculoskeletal:        General: No tenderness. Normal range of motion.     Cervical back: Normal range of motion and neck supple.  Lymphadenopathy:     Cervical: No cervical adenopathy.  Skin:    General: Skin is warm and dry.     Findings: No erythema or rash.  Neurological:     Mental Status: He is alert.     Coordination: Coordination normal.  Psychiatric:        Behavior: Behavior normal.     ED  Results / Procedures / Treatments   Labs (all labs ordered are listed, but only abnormal results are displayed) Labs Reviewed  BASIC METABOLIC PANEL - Abnormal; Notable for the following components:      Result Value   Glucose, Bld 107 (*)    All other components within normal limits  CBC  TROPONIN I (HIGH SENSITIVITY)  TROPONIN I (HIGH SENSITIVITY)    EKG EKG Interpretation  Date/Time:  Monday Oct 28 2020 16:18:46 EDT Ventricular Rate:  66 PR Interval:  140 QRS Duration: 96 QT Interval:  366 QTC Calculation: 383 R Axis:   130 Text Interpretation: Normal sinus rhythm Right axis deviation Incomplete right bundle branch block Septal infarct , age undetermined Abnormal ECG since last tracing no significant change Confirmed by Noemi Chapel [phone]) on 10/28/2020 7:29:06 PM   Radiology DG Chest 2 View  Result Date: 10/28/2020 CLINICAL DATA:  Chest pain and left arm pain. EXAM: CHEST - 2 VIEW COMPARISON:  September 30, 2020 FINDINGS: The heart size and mediastinal contours are within normal limits. Both lungs are clear. The visualized skeletal structures are unremarkable. IMPRESSION: No active cardiopulmonary disease. Electronically Signed   By: Virgina Norfolk M.D.   On: 10/28/2020 16:59    Procedures Procedures   Medications Ordered in ED Medications  aspirin chewable tablet 324 mg (324 mg Oral Given by Other 10/28/20 2021)  ticagrelor (BRILINTA) tablet 90 mg (90 mg Oral Given 10/28/20 2022)    ED Course  I have reviewed the triage vital signs and the nursing notes.  Pertinent labs & imaging results that were available during my care of the patient were reviewed by me and considered in my medical decision making (see chart for details).    MDM Rules/Calculators/A&P                          This patient has significant risk for coronary disease given his history, that being said his EKG is unchanged and his troponin is unremarkable, that being said his pain is intermittent.  I  will discuss with cardiology, he may need trending of his troponins and a stress test.  The patient is agreeable to the plan.  He will get his evening dose of Brilinta as well  I discussed the care with the cardiologist on-call at 8:30 PM who will come to see the patient.  Thankfully troponin is negative, chest x-ray unremarkable  I discussed the case with the on-call cardiologist who came to see the patient in person, he recommends that the patient be discharged if the second troponin is normal to follow-up in the outpatient setting, the patient's primary cardiologist will be made aware by on-call cardiologist, office will reach out to patient for appointment.  Patient agreeable, second troponin negative, at 1025 the patient was reevaluated and appears well, chest pain-free and stable for discharge, understands indications for return.  Final Clinical Impression(s) / ED Diagnoses Final diagnoses:  Chest pain, unspecified type      Noemi Chapel, MD 10/28/20 2227    Noemi Chapel, MD 10/28/20 2230

## 2020-10-28 NOTE — ED Provider Notes (Signed)
Emergency Medicine Provider Triage Evaluation Note  Steven Kemp , a 55 y.o. male  was evaluated in triage.  Pt complains of dull midsternal chest pain onset last night while working on his daughter's car using a grinder.  Reports numbness down his left arm.  Pain has been intermittent since that time.  No associated shortness of breath, nausea, vomiting, diaphoresis.  Reports prior MI few years ago, has 1 stent, managed by Dr. Miachel Roux.  Review of Systems  Positive: CP Negative: SHOB, nausea, diaphoresis   Physical Exam  There were no vitals taken for this visit. Gen:   Awake, no distress   Resp:  Normal effort  MSK:   Moves extremities without difficulty  Other:    Medical Decision Making  Medically screening exam initiated at 4:04 PM.  Appropriate orders placed.  Steven Kemp was informed that the remainder of the evaluation will be completed by another provider, this initial triage assessment does not replace that evaluation, and the importance of remaining in the ED until their evaluation is complete.     Steven Learn, PA-C 10/28/20 1608    Lucrezia Starch, MD 10/29/20 [phone]

## 2020-10-28 NOTE — Discharge Instructions (Signed)
After our cardiologist saw you they recommended that you be seen in the office since your tests have been normal, no signs of heart attack.  Return to the ER for severe or worsening symptoms.  Call the office in the morning to make the next available appointment.

## 2020-10-28 NOTE — Telephone Encounter (Signed)
May need to see him on Thursday or Friday rather than June.

## 2020-10-28 NOTE — ED Triage Notes (Incomplete)
Pt here with

## 2020-10-28 NOTE — Telephone Encounter (Signed)
Pt c/o of Chest Pain: STAT if CP now or developed within 24 hours  1. Are you having CP right now? no  2. Are you experiencing any other symptoms (ex. SOB, nausea, vomiting, sweating)? no  3. How long have you been experiencing CP? Last night   4. Is your CP continuous or coming and going? Came and went  5. Have you taken Nitroglycerin? no  Patient states he had chest pain last night and took 4 aspirin. He states his left arm has also been tingling all day. He states he was working on a car and it vibrated his arm so he is not sure if that is why. He states he also was at the hospital a couple weeks ago and his wife wants him to be seen by the office or the ED.  ?

## 2020-10-28 NOTE — ED Notes (Signed)
MD miller at bedside

## 2020-10-28 NOTE — Telephone Encounter (Signed)
RN received direct call. Patient states that the pain started last night and he "just felt different." Patient states his left arm began to tingle and feel heavy. Patient took 4 aspirin in which he reported helped some, but the pain and tingling in left arm has since returned today. Patient is unable to take his BP and HR. Patient denies shortness of breath or nausea and vomiting, but reports "he just doesn't feel himself." Patient wanted to speak to a nurse before he went to the ER. His wife feels very strongly that he needs to be seen at the ER. RN advised that given his history and his concern it was a good idea to be seen at the ER. Patient seen at [facility] on 4/11 for similar episode. Patient in agreement and verbalized understanding. Will route to Dr. Irish Lack and Pat,RN for [facility].

## 2020-10-29 ENCOUNTER — Other Ambulatory Visit: Payer: Self-pay | Admitting: Interventional Cardiology

## 2020-10-29 NOTE — Telephone Encounter (Signed)
I spoke with patient and scheduled him to see Dr Irish Lack on 5/13 at 9:00

## 2020-10-29 NOTE — Consult Note (Signed)
Cardiology Consult    Patient ID: Steven Kemp MRN: [id number], DOB/AGE: Oct 16, 1965   Admit date: 10/28/2020 Date of Consult: 10/29/2020  Primary Physician: Owens Loffler, MD Primary Cardiologist: Larae Grooms, MD Requesting Provider: Noemi Chapel, MD  Patient Profile    Steven Kemp is a 55 y.o. male with a history of CAD w/ prior STEMI in 11/01/2016 at which time he had DES to D1 w/ nonobstructive diffuse disease otherwise presenting with chest pain.  History of Present Illness    55 year old male with CAD as above, HTN, HLD who presents to ED with complaint of chest pain and some arm tingling.  He reports that he was working on his daughter's car over the weekend with a vibrating sander resurfacing it on Sunday.  He developed some sharp left sided, parasternal chest pain with an associated vibrating symptom in his arm this morning.  The vibrating symptom persisted until late this afternoon but has abated.  He's had a few intermittent episodes of the sharp chest pain.  It is characteristically different than the pain when he had his MI, he has no exertional component, and it is not alleviated by rest or nitroglycerin.  He took 4 baby aspirin when the pain came on and it went away.  He had his initial MI in 10/2016.  Following this he had ongoing chest pain and performed an exercise MPI without inducible ischemia or infarct by imaging and exercising 11.5 mets.  Past Medical History   Past Medical History:  Diagnosis Date  . Borderline diabetes   . CAD (coronary artery disease), native coronary artery    a. STEMI 10/22/2016 s/p DES to the first diagonal with residual moderate disease in the LAD, right PDA and distal LAD.  Marland Kitchen Hyperlipidemia   . Hypertension     Past Surgical History:  Procedure Laterality Date  . CORONARY STENT INTERVENTION N/A 10/22/2016   Procedure: Coronary Stent Intervention;  Surgeon: Jettie Booze, MD;  Location: [facility];   Service: Cardiovascular;  Laterality: N/A;  . LEFT HEART CATH AND CORONARY ANGIOGRAPHY N/A 10/22/2016   Procedure: Left Heart Cath and Coronary Angiography;  Surgeon: Jettie Booze, MD;  Location: [facility];  Service: Cardiovascular;  Laterality: N/A;     No Known Allergies Inpatient Medications    Aspirin  Family History    Family History  Problem Relation Age of Onset  . Hypertension Father   . Colon cancer Neg Hx   . Esophageal cancer Neg Hx   . Rectal cancer Neg Hx   . Stomach cancer Neg Hx    He indicated that his mother is deceased. He indicated that his father is deceased. He indicated that his maternal grandmother is deceased. He indicated that his maternal grandfather is deceased. He indicated that his paternal grandmother is deceased. He indicated that his paternal grandfather is deceased. He indicated that the status of his neg hx is unknown.   Social History    Social History   Socioeconomic History  . Marital status: Married    Spouse name: Not on file  . Number of children: 2  . Years of education: Not on file  . Highest education level: Not on file  Occupational History  . Occupation: [employer]    Comment: Data base management  Tobacco Use  . Smoking status: Never Smoker  . Smokeless tobacco: Never Used  Vaping Use  . Vaping Use: Never  used  Substance and Sexual Activity  . Alcohol use: Yes    Comment: very rare  . Drug use: No  . Sexual activity: Not on file  Other Topics Concern  . Not on file  Social History Narrative  . Not on file   Social Determinants of Health   Financial Resource Strain: Not on file  Food Insecurity: Not on file  Transportation Needs: Not on file  Physical Activity: Not on file  Stress: Not on file  Social Connections: Not on file  Intimate Partner Violence: Not on file     Review of Systems    General:  No chills, fever, night sweats or weight changes.   Cardiovascular:  No chest pain, dyspnea on exertion, edema, orthopnea, palpitations, paroxysmal nocturnal dyspnea. Dermatological: No rash, lesions/masses Respiratory: No cough, dyspnea Urologic: No hematuria, dysuria Abdominal:   No nausea, vomiting, diarrhea, bright red blood per rectum, melena, or hematemesis Neurologic:  No visual changes, wkns, changes in mental status. All other systems reviewed and are otherwise negative except as noted above.  Physical Exam    Blood pressure 128/89, pulse (!) 56, temperature 97.8 F (36.6 C), temperature source Oral, resp. rate 19, SpO2 98 %.    No intake or output data in the 24 hours ending 10/29/20 0341 Wt Readings from Last 3 Encounters:  08/01/19 80.5 kg  03/16/19 78.9 kg  03/06/19 79 kg    CONSTITUTIONAL: alert and conversant, well-appearing, nourished, no distress HEENT: oropharynx clear and moist, no mucosal lesions, normal dentition, conjunctiva normal, EOM intact, pupils equal, no lid lag. NECK: supple, no cervical adenopathy, no thyromegaly CARDIOVASCULAR: Regular rhythm. No gallop, murmur, or rub. Normal S1/S2. Radial pulses intact. JVP flat. No carotid bruits. 2+ bilateral DP pulses PULMONARY/CHEST WALL: no deformities, normal breath sounds bilaterally, normal work of breathing ABDOMINAL: soft, non-tender, non-distended EXTREMITIES: no edema or muscle atrophy, warm and well-perfused SKIN: Dry and intact without apparent rashes or wounds.  NEUROLOGIC: alert, normal gait, no abnormal movements, cranial nerves grossly intact.   Labs    hsTrop I 3, 3 4 hours apart  Lab Results  Component Value Date   WBC 7.0 10/28/2020   HGB 15.3 10/28/2020   HCT 45.5 10/28/2020   MCV 86.0 10/28/2020   PLT 188 10/28/2020    Recent Labs  Lab 10/28/20 1609  NA 137  K 3.7  CL 105  CO2 26  BUN 12  CREATININE 1.01  CALCIUM 9.1  GLUCOSE 107*   Lab Results  Component Value Date   CHOL 97 (L) 02/06/2019   HDL 34 (L) 02/06/2019    LDLCALC 51 02/06/2019   TRIG 62 02/06/2019   No results found for: [facility]   Radiology Studies    DG Chest 2 View  Result Date: 10/28/2020 CLINICAL DATA:  Chest pain and left arm pain. EXAM: CHEST - 2 VIEW COMPARISON:  September 30, 2020 FINDINGS: The heart size and mediastinal contours are within normal limits. Both lungs are clear. The visualized skeletal structures are unremarkable. IMPRESSION: No active cardiopulmonary disease. Electronically Signed   By: Virgina Norfolk M.D.   On: 10/28/2020 16:59   DG Chest 2 View  Result Date: 09/30/2020 CLINICAL DATA:  Left-sided chest pain and left arm tingling. EXAM: CHEST - 2 VIEW COMPARISON:  None. FINDINGS: The heart size and mediastinal contours are within normal limits. Both lungs are clear. The visualized skeletal structures are unremarkable. IMPRESSION: No active cardiopulmonary disease. Electronically Signed   By: Joyce Gross.D.  On: 09/30/2020 19:22    ECG & Cardiac Imaging    NSR with septal infarct pattern otherwise unremarkable. Stress MPI 11.5 mets without inducible ischemia or infarct. LHC for STEMI 10/22/16, D1 culprit w/ 2.25 x 16  Mm DES.  Residual prox LCx 50%, dLAD 90% (not feasible), pLAD 25%  Assessment & Plan   84M presented with chest pain in the setting of known CAD with HTN and HLD.  At this time chest pain sounds atypical and uncharacteristic of angina.  He confirms symptoms are resolved and very different than initial presentation.  Troponin is below threshold twice 4 hours apart.  ECG is unchanged from prior  Overall presentation is inconsistent with ACS.  Suspect atypical chest pain, possibly musculoskeletal given the work with the sander over the weekend.  Nevertheless encouraged him to follow-up with Dr. Irish Lack for risk stratification v. Repeat angiogram given recurrent presentations with chest pain and he's amenable to outpatient management.  Recommendations #CAD s/p PCI D1, residual CAD 2018 - Continue  high intensity statin - Continue beta blocker, lisinopril unchanged with well controlled BP today - Continue ticagrelor monotherapy - Lifestyle modification - Recommend follow-up with Dr. Irish Lack to which he's agreeable.  Signed, Delight Hoh, MD 10/29/2020, 3:41 AM  For questions or updates, please contact   Please consult [email or web address] for contact info under Cardiology/STEMI.

## 2020-10-29 NOTE — Telephone Encounter (Signed)
Patient calling back. He states he went to the ED and was told to have a stress test, but there is no order

## 2020-10-30 NOTE — Progress Notes (Signed)
Cardiology Office Note   Date:  10/31/2020   ID:  Steven Kemp, DOB 24-Nov-1965, MRN [id number]  PCP:  Owens Loffler, MD    No chief complaint on file.  CAD  Wt Readings from Last 3 Encounters:  10/31/20 174 lb 6.4 oz (79.1 kg)  08/01/19 177 lb 6.4 oz (80.5 kg)  03/16/19 174 lb (78.9 kg)       History of Present Illness: Steven Kemp is a 55 y.o. male   with h/o HTN, HLD and borderline DM, who presented to [facility] on 10/22/16 with CC of substernal chest and left arm pain with diaphoresis. He was brought in by EMS. On EMS arrival,EKG showed acute changes in the inferior lateral leads. Code STEMI was called. He was given 324 mg of ASAand 1 SL nitro with resolution of pain. He was brought to the cath lab emergently for cardiac catheterization.   Emergent LHC was performed and he was found to have an occluded 1st diagonal vessel. S/p DES x1 to 1st Diag with residualmoderate disease in the LAD, right PDA and distal LAD. LV gram showed preserved LV function. He was continued on IV tirofiban for 2 hours post cath. Plan isfor DAPT with ASA/Brilinta for at least one year. His statin was increased to Lipitor 80mg  and hestarted on metoprolol 12.5mg  BID. His home lisinopril was restarted prior to discharge.    Cath in 5/18 showed:  Ost RPDA to RPDA lesion, 80 %stenosed. Bifurcation of PDA /PLA occurs more proximally.  Prox Cx lesion, 50 %stenosed.  Prox LAD to Mid LAD lesion, 25 %stenosed.  Dist LAD lesion, 90 %stenosed. This is at the apex and not amenable to PCI.  1st Diag lesion, 95 %stenosed. A STENT PROMUS PREM MR 2.25X16 drug eluting stent was successfully placed, postdilated to > 2.5 mm.  Post intervention, there is a 0% residual stenosis.  The left ventricular systolic function is normal.  LV end diastolic pressure is normal.  The left ventricular ejection fraction is 50-55% by visual estimate. There is no aortic valve stenosis.  In 2019, he was  planning a vasectomy.  He was changed to clopidogrel monotherapy due to a fair amount of diffuse, small vessel CAD.  He has made two trips to the ER for chest pain.  They were not like prior angina.  He took some aspirin with relief.  Negative w/u in the ER.      Past Medical History:  Diagnosis Date  . Borderline diabetes   . CAD (coronary artery disease), native coronary artery    a. STEMI 10/22/2016 s/p DES to the first diagonal with residual moderate disease in the LAD, right PDA and distal LAD.  Marland Kitchen Hyperlipidemia   . Hypertension     Past Surgical History:  Procedure Laterality Date  . CORONARY STENT INTERVENTION N/A 10/22/2016   Procedure: Coronary Stent Intervention;  Surgeon: Jettie Booze, MD;  Location: [facility];  Service: Cardiovascular;  Laterality: N/A;  . LEFT HEART CATH AND CORONARY ANGIOGRAPHY N/A 10/22/2016   Procedure: Left Heart Cath and Coronary Angiography;  Surgeon: Jettie Booze, MD;  Location: [facility];  Service: Cardiovascular;  Laterality: N/A;     Current Outpatient Medications  Medication Sig Dispense Refill  . albuterol (VENTOLIN HFA) 108 (90 Base) MCG/ACT inhaler TAKE 2 PUFFS BY MOUTH EVERY 6 HOURS AS NEEDED FOR WHEEZE OR SHORTNESS OF BREATH 6.7 g 2  . lisinopril (ZESTRIL) 20 MG tablet TAKE 1 TABLET BY MOUTH EVERY  DAY 90 tablet 3  . metoprolol tartrate (LOPRESSOR) 25 MG tablet TAKE 1/2 TABLET BY MOUTH 2 TIMES DAILY. 90 tablet 1  . nitroGLYCERIN (NITROSTAT) 0.4 MG SL tablet PLACE 1 TABLET (0.4 MG TOTAL) UNDER THE TONGUE EVERY 5 (FIVE) MINUTES AS NEEDED. 25 tablet 3  . sildenafil (REVATIO) 20 MG tablet TAKE 2 TO 5 TABLETS (40-100MG ) BY MOUTH 30 MINUTES PRIOR TO INTERCOURSE AS DIRECTED 100 tablet 5  . ticagrelor (BRILINTA) 90 MG TABS tablet TAKE 1 TABLET BY MOUTH TWICE A DAY. Please keep upcoming appt in June 2022 with Dr. Irish Lack before anymore refills. Thank you Final Attempt 180 tablet 0  . atorvastatin (LIPITOR) 80 MG  tablet Take 1 tablet (80 mg total) by mouth daily at 6 PM. 90 tablet 3   No current facility-administered medications for this visit.    Allergies:   Patient has no known allergies.    Social History:  The patient  reports that he has never smoked. He has never used smokeless tobacco. He reports current alcohol use. He reports that he does not use drugs.   Family History:  The patient's family history includes Hypertension in his father.    ROS:  Please see the history of present illness.   Otherwise, review of systems are positive for recent chest pain- different from MI sensation.   All other systems are reviewed and negative.    PHYSICAL EXAM: VS:  BP 122/68   Pulse 74   Ht 5\' 8"  (1.727 m)   Wt 174 lb 6.4 oz (79.1 kg)   SpO2 99%   BMI 26.52 kg/m  , BMI Body mass index is 26.52 kg/m. GEN: Well nourished, well developed, in no acute distress  HEENT: normal  Neck: no JVD, carotid bruits, or masses Cardiac: RRR; no murmurs, rubs, or gallops,no edema  Respiratory:  clear to auscultation bilaterally, normal work of breathing GI: soft, nontender, nondistended, + BS MS: no deformity or atrophy  Skin: warm and dry, no rash Neuro:  Strength and sensation are intact Psych: euthymic mood, full affect   EKG:   The ekg ordered in 5/22 in ER demonstrates NSR, no ST changes   Recent Labs: 10/28/2020: BUN 12; Creatinine, Ser 1.01; Hemoglobin 15.3; Platelets 188; Potassium 3.7; Sodium 137   Lipid Panel    Component Value Date/Time   CHOL 97 (L) 02/06/2019 0759   TRIG 62 02/06/2019 0759   HDL 34 (L) 02/06/2019 0759   CHOLHDL 2.9 02/06/2019 0759   CHOLHDL 3 01/19/2019 0841   VLDL 11.6 01/19/2019 0841   LDLCALC 51 02/06/2019 0759   LDLDIRECT 103.0 06/18/2015 1603     Other studies Reviewed: Additional studies/ records that were reviewed today with results demonstrating: labs reviewed.   ASSESSMENT AND PLAN:  1.   CAD/Old MI: Atypical chest pain with prior history of PCI and  known small vessel coronary disease.  Plan for exercise nuclear stress test.  He is agreeable. 2.   Hyperlipidemia: LDL 51 in August 2020.  Recheck lipids when fasting.  Continue atorvastatin. 3.   HTN: The current medical regimen is effective;  continue present plan and medications.  Well-controlled on lisinopril.  4.   Borderline DM: A1C 6.1 in 2020.  Will recheck.  Whole food, plant-based diet recommended.  Avoid processed foods.   Current medicines are reviewed at length with the patient today.  The patient concerns regarding his medicines were addressed.  The following changes have been made:  No change  Labs/ tests ordered  today include:   Orders Placed This Encounter  Procedures  . Lipid Profile  . Hepatic function panel  . HgB A1c  . MYOCARDIAL PERFUSION IMAGING    Recommend 150 minutes/week of aerobic exercise Low fat, low carb, high fiber diet recommended  Disposition:   FU for stress test   Signed, Larae Grooms, MD  10/31/2020 12:17 PM    [facility] [place], [place], [place]  [address] Phone: [phone]; Fax: [phone]

## 2020-10-31 ENCOUNTER — Encounter: Payer: Self-pay | Admitting: Interventional Cardiology

## 2020-10-31 ENCOUNTER — Ambulatory Visit (INDEPENDENT_AMBULATORY_CARE_PROVIDER_SITE_OTHER): Payer: BC Managed Care – PPO | Admitting: Interventional Cardiology

## 2020-10-31 ENCOUNTER — Telehealth (HOSPITAL_COMMUNITY): Payer: Self-pay

## 2020-10-31 ENCOUNTER — Other Ambulatory Visit: Payer: Self-pay

## 2020-10-31 ENCOUNTER — Encounter: Payer: Self-pay | Admitting: *Deleted

## 2020-10-31 VITALS — BP 122/68 | HR 74 | Ht 68.0 in | Wt 174.4 lb

## 2020-10-31 DIAGNOSIS — E785 Hyperlipidemia, unspecified: Secondary | ICD-10-CM | POA: Diagnosis not present

## 2020-10-31 DIAGNOSIS — R7303 Prediabetes: Secondary | ICD-10-CM | POA: Diagnosis not present

## 2020-10-31 DIAGNOSIS — I10 Essential (primary) hypertension: Secondary | ICD-10-CM | POA: Diagnosis not present

## 2020-10-31 DIAGNOSIS — I25118 Atherosclerotic heart disease of native coronary artery with other forms of angina pectoris: Secondary | ICD-10-CM

## 2020-10-31 DIAGNOSIS — I2510 Atherosclerotic heart disease of native coronary artery without angina pectoris: Secondary | ICD-10-CM

## 2020-10-31 DIAGNOSIS — R079 Chest pain, unspecified: Secondary | ICD-10-CM

## 2020-10-31 MED ORDER — ATORVASTATIN CALCIUM 80 MG PO TABS: 1.0000 | ORAL_TABLET | Freq: Every day | ORAL | 3 refills | Status: DC

## 2020-10-31 NOTE — Patient Instructions (Addendum)
Medication Instructions:  Your physician recommends that you continue on your current medications as directed. Please refer to the Current Medication list given to you today.  *If you need a refill on your cardiac medications before your next appointment, please call your pharmacy*   Lab Work: Your physician recommends that you return for lab work on day of stress test.  This will be fasting--Lipid, Liver, A1c  If you have labs (blood work) drawn today and your tests are completely normal, you will receive your results only by: Marland Kitchen MyChart Message (if you have MyChart) OR . A paper copy in the mail If you have any lab test that is abnormal or we need to change your treatment, we will call you to review the results.   Testing/Procedures: Your physician has requested that you have an exercise stress myoview. For further information please visit [email or web address]. Please follow instruction sheet, as given.    Follow-Up: At [facility], you and your health needs are our priority.  As part of our continuing mission to provide you with exceptional heart care, we have created designated Provider Care Teams.  These Care Teams include your primary Cardiologist (physician) and Advanced Practice Providers (APPs -  Physician Assistants and Nurse Practitioners) who all work together to provide you with the care you need, when you need it.  We recommend signing up for the patient portal called "MyChart".  Sign up information is provided on this After Visit Summary.  MyChart is used to connect with patients for Virtual Visits (Telemedicine).  Patients are able to view lab/test results, encounter notes, upcoming appointments, etc.  Non-urgent messages can be sent to your provider as well.   To learn more about what you can do with MyChart, go to [email or web address].    Your next appointment:   Based on test results  The format for your next appointment:   In Person  Provider:   You may see  Larae Grooms, MD or one of the following Advanced Practice Providers on your designated Care Team:    Melina Copa, PA-C  Ermalinda Barrios, PA-C    Other Instructions

## 2020-10-31 NOTE — Telephone Encounter (Signed)
Spoke with the patient, detailed instructions given. He stated that he would be here for his test. Asked to call back with any questions. S.Evangelyne Loja EMTP 

## 2020-11-01 ENCOUNTER — Ambulatory Visit: Payer: BC Managed Care – PPO | Admitting: Interventional Cardiology

## 2020-11-05 ENCOUNTER — Other Ambulatory Visit: Payer: Self-pay

## 2020-11-05 ENCOUNTER — Other Ambulatory Visit: Payer: BC Managed Care – PPO

## 2020-11-05 ENCOUNTER — Ambulatory Visit (HOSPITAL_COMMUNITY): Payer: BC Managed Care – PPO | Attending: Interventional Cardiology

## 2020-11-05 DIAGNOSIS — R079 Chest pain, unspecified: Secondary | ICD-10-CM

## 2020-11-05 DIAGNOSIS — I25118 Atherosclerotic heart disease of native coronary artery with other forms of angina pectoris: Secondary | ICD-10-CM | POA: Diagnosis not present

## 2020-11-05 DIAGNOSIS — I10 Essential (primary) hypertension: Secondary | ICD-10-CM | POA: Diagnosis not present

## 2020-11-05 DIAGNOSIS — R7303 Prediabetes: Secondary | ICD-10-CM

## 2020-11-05 DIAGNOSIS — E785 Hyperlipidemia, unspecified: Secondary | ICD-10-CM

## 2020-11-05 LAB — HEMOGLOBIN A1C
Est. average glucose Bld gHb Est-mCnc: 128 mg/dL
Hgb A1c MFr Bld: 6.1 % — ABNORMAL HIGH (ref 4.8–5.6)

## 2020-11-05 LAB — LIPID PANEL
Chol/HDL Ratio: 3.3 ratio (ref 0.0–5.0)
Cholesterol, Total: 116 mg/dL (ref 100–199)
HDL: 35 mg/dL — ABNORMAL LOW (ref >39)
LDL Chol Calc (NIH): 68 mg/dL (ref 0–99)
Triglycerides: 61 mg/dL (ref 0–149)
VLDL Cholesterol Cal: 13 mg/dL (ref 5–40)

## 2020-11-05 LAB — HEPATIC FUNCTION PANEL
ALT: 25 IU/L (ref 0–44)
AST: 24 IU/L (ref 0–40)
Albumin: 4.4 g/dL (ref 3.8–4.9)
Alkaline Phosphatase: 96 IU/L (ref 44–121)
Bilirubin Total: 0.5 mg/dL (ref 0.0–1.2)
Bilirubin, Direct: 0.15 mg/dL (ref 0.00–0.40)
Total Protein: 7.1 g/dL (ref 6.0–8.5)

## 2020-11-05 LAB — MYOCARDIAL PERFUSION IMAGING
Estimated workload: 9.3 METS
Exercise duration (min): 7 min
Exercise duration (sec): 30 s
LV dias vol: 69 mL (ref 62–150)
LV sys vol: 28 mL
MPHR: 166 {beats}/min
Peak HR: 162 {beats}/min
Percent HR: 97 %
RPE: 19
Rest HR: 59 {beats}/min
SDS: 1
SRS: 1
SSS: 2
TID: 0.95

## 2020-11-05 MED ORDER — TECHNETIUM TC 99M TETROFOSMIN IV KIT
31.5000 | PACK | Freq: Once | INTRAVENOUS | Status: AC | PRN
Start: 2020-11-05 — End: 2020-11-05
  Administered 2020-11-05: 31.5 via INTRAVENOUS
  Filled 2020-11-05: qty 32

## 2020-11-05 MED ORDER — TECHNETIUM TC 99M TETROFOSMIN IV KIT
10.2000 | PACK | Freq: Once | INTRAVENOUS | Status: AC | PRN
  Administered 2020-11-05: 10.2 via INTRAVENOUS
  Filled 2020-11-05: qty 11

## 2020-11-12 ENCOUNTER — Other Ambulatory Visit: Payer: Self-pay | Admitting: Interventional Cardiology

## 2020-11-14 ENCOUNTER — Telehealth: Payer: Self-pay | Admitting: *Deleted

## 2020-11-14 ENCOUNTER — Other Ambulatory Visit: Payer: Self-pay | Admitting: *Deleted

## 2020-11-14 NOTE — Telephone Encounter (Signed)
Mr. Steven Kemp left voicemail on triage line stating he tested positive for Covid and was started on Molnupiravir 200 mg.  He now has hives on his upper torso up to his neck. Spoke with Steven Kemp. He states the hives are itchy but not unbearable. He denies any SOB or trouble swallowing.   He states he is still coughing up a lot of phelgm.     He started the [facility] on Monday and should finish up tomorrow.  Last dose was today at 7:00 am.  Wants to know what he should do.  Please advise.

## 2020-11-14 NOTE — Telephone Encounter (Signed)
Please call  If he has hives on that much of his body, then he needs to stop it.  Take Benadryl 50 mg now, and repeat in the evening before bed.  Take 3 times a day tomorrow.

## 2020-11-14 NOTE — Telephone Encounter (Signed)
[facility] [place] Day - Client TELEPHONE ADVICE RECORD AccessNurse Patient Name: PHARRELL LEDFORD Gender: Male DOB: [birth year] Age: 55 Y 64 M 11 D Return Phone Number: [phone] (Primary) Address: [place] [address] Client [facility] - Client Client Site [place] - Day Physician Copland, Frederico Hamman - MD Contact Type Call Who Is Calling Patient / Member / Family / Caregiver Call Type Triage / Clinical Relationship To Patient Self Return Phone Number [phone] (Primary) Chief Complaint Hives Reason for Call Symptomatic / Request for [place] states he has tested positive for COVID19 and he has been taking Allopurinol and now he is breaking out into hives. The office wants him triaged. Additional Comment He is also coughing up green mucus. Translation No Nurse Assessment Nurse: Nyoka Cowden, RN, Tanika Date/Time (Eastern Time): 11/14/2020 2:07:32 PM Confirm and document reason for call. If symptomatic, describe symptoms. ---Caller states he has tested positive for COVID19 started Allopurinol on monday and now he is breaking out into hives yesterday. hives to upper torso, itchy Does the patient have any new or worsening symptoms? ---Yes Will a triage be completed? ---Yes Related visit to physician within the last 2 weeks? ---No Does the PT have any chronic conditions? (i.e. diabetes, asthma, this includes High risk factors for pregnancy, etc.) ---No Is this a behavioral health or substance abuse call? ---No Guidelines Guideline Title Affirmed Question Affirmed Notes Nurse Date/Time Eilene Ghazi Time) Hives Localized hives Nyoka Cowden, RN, Tanika 11/14/2020 2:08:48 PM Disp. Time Eilene Ghazi Time) Disposition Final User 11/14/2020 2:15:02 PM Home Care Yes Nyoka Cowden, RN, Tanika PLEASE NOTE: All timestamps contained within this report are represented as  [place] Standard Time. CONFIDENTIALTY NOTICE: This fax transmission is intended only for the addressee. It contains information that is legally privileged, confidential or otherwise protected from use or disclosure. If you are not the intended recipient, you are strictly prohibited from reviewing, disclosing, copying using or disseminating any of this information or taking any action in reliance on or regarding this information. If you have received this fax in error, please notify us immediately by telephone so that we can arrange for its return to [place]. Phone: [phone], Toll-Free: [phone], Fax: [phone] Page: 2 of 2 Call Id: [id number] Caller Disagree/Comply Comply Caller Understands Yes PreDisposition Call Doctor Care Advice Given Per Guideline HOME CARE: * You should be able to treat this at home. * For localized hives, wash the allergic substance off the skin with soap and water. LOCALIZED HIVES: HYDROCORTISONE CREAM FOR VERY ITCHY SPOTS: * Put 1% hydrocortisone cream on the itchy area(s) 3 times a day. Use it for a couple days, until it feels better. This will help decrease the itching. * You become worse * Hives last over 1 week * Severe hives or severe itching persist over 24 hours despite taking an antihistamine CALL BACK IF: CARE ADVICE given per Hives (Adult) guideline. Comments User: Gorden Harms, RN Date/Time Eilene Ghazi Time): 11/14/2020 2:09:39 PM correction molnupiravir User: Gorden Harms, RN Date/Time Eilene Ghazi Time): 11/14/2020 2:15:00 PM states will call office for a doctors excuse for work.

## 2020-11-14 NOTE — Telephone Encounter (Signed)
Please see Dr Lorelei Pont and Butch Penny CMA notes below this access nurse note.

## 2020-11-14 NOTE — Telephone Encounter (Signed)
Mr. Pflaum notified as instructed by telephone.  Patient states understanding.

## 2020-11-29 ENCOUNTER — Ambulatory Visit: Payer: BC Managed Care – PPO | Admitting: Interventional Cardiology

## 2020-12-10 ENCOUNTER — Other Ambulatory Visit: Payer: Self-pay | Admitting: Interventional Cardiology

## 2021-01-10 ENCOUNTER — Other Ambulatory Visit: Payer: Self-pay | Admitting: Interventional Cardiology

## 2021-03-19 ENCOUNTER — Ambulatory Visit (INDEPENDENT_AMBULATORY_CARE_PROVIDER_SITE_OTHER): Payer: BC Managed Care – PPO | Admitting: Family Medicine

## 2021-03-19 ENCOUNTER — Other Ambulatory Visit: Payer: Self-pay

## 2021-03-19 ENCOUNTER — Encounter: Payer: Self-pay | Admitting: Family Medicine

## 2021-03-19 VITALS — BP 120/80 | HR 62 | Temp 98.3°F | Ht 67.0 in | Wt 177.0 lb

## 2021-03-19 DIAGNOSIS — Z111 Encounter for screening for respiratory tuberculosis: Secondary | ICD-10-CM | POA: Diagnosis not present

## 2021-03-19 DIAGNOSIS — Z125 Encounter for screening for malignant neoplasm of prostate: Secondary | ICD-10-CM | POA: Diagnosis not present

## 2021-03-19 DIAGNOSIS — Z79899 Other long term (current) drug therapy: Secondary | ICD-10-CM

## 2021-03-19 DIAGNOSIS — Z1159 Encounter for screening for other viral diseases: Secondary | ICD-10-CM

## 2021-03-19 DIAGNOSIS — Z0000 Encounter for general adult medical examination without abnormal findings: Secondary | ICD-10-CM | POA: Diagnosis not present

## 2021-03-19 LAB — POC URINALSYSI DIPSTICK (AUTOMATED)
Bilirubin, UA: NEGATIVE
Glucose, UA: NEGATIVE
Ketones, UA: NEGATIVE
Leukocytes, UA: NEGATIVE
Nitrite, UA: NEGATIVE
Protein, UA: NEGATIVE
Spec Grav, UA: 1.025 (ref 1.010–1.025)
Urobilinogen, UA: 0.2 E.U./dL
pH, UA: 5.5 (ref 5.0–8.0)

## 2021-03-19 MED ORDER — SILDENAFIL CITRATE 20 MG PO TABS: ORAL_TABLET | ORAL | 5 refills | Status: DC

## 2021-03-19 MED ORDER — PERMETHRIN 5 % EX CREA: TOPICAL_CREAM | CUTANEOUS | 0 refills | Status: DC

## 2021-03-19 NOTE — Progress Notes (Signed)
Steven Kemp T. Fares Ramthun, MD, [place] at [facility] [place], [address]  Phone: [phone]  FAX: [phone]  Steven Kemp - 55 y.o. male  MRN [id number]  Date of Birth: Mar 27, 1966  Date: 03/19/2021  PCP: Owens Loffler, MD  Referral: Owens Loffler, MD  Chief Complaint  Patient presents with   Annual Exam    This visit occurred during the SARS-CoV-2 public health emergency.  Safety protocols were in place, including screening questions prior to the visit, additional usage of staff PPE, and extensive cleaning of exam room while observing appropriate contact time as indicated for disinfecting solutions.   Patient Care Team: Owens Loffler, MD as PCP - General Jettie Booze, MD as PCP - Cardiology (Cardiology) Subjective:   Steven Kemp is a 55 y.o. pleasant patient who presents with the following:  Preventative Health Maintenance Visit:  Health Maintenance Summary Reviewed and updated, unless pt declines services.  Tobacco History Reviewed. Alcohol: No concerns, no excessive use Exercise Habits: Some activity, rec at least 30 mins 5 times a week STD concerns: no risk or activity to increase risk Drug Use: None  Now on DAPT medical therapy S/p prior PCI, distant  Quant TB Rubella  Shingrix Covid bivalent  MAIL THE RESULTS OF ALL TESTING   LOOK UP CHEWABLE ED MEDICATION - decided he did not want.  Health Maintenance  Topic Date Due   Hepatitis C Screening  Never done   Zoster Vaccines- Shingrix (1 of 2) Never done   COVID-19 Vaccine (3 - Booster for Pfizer series) 09/03/2020   INFLUENZA VACCINE  09/19/2021 (Originally 01/20/2021)   COLONOSCOPY (Pts 45-53yrs Insurance coverage will need to be confirmed)  03/15/2024   TETANUS/TDAP  02/17/2028   HIV Screening  Completed   HPV VACCINES  Aged Out   Immunization History  Administered Date(s) Administered   PFIZER(Purple  Top)SARS-COV-2 Vaccination 03/15/2020, 04/05/2020   Tdap 02/16/2018   Patient Active Problem List   Diagnosis Date Noted   Coughing 07/17/2019   CAD (coronary artery disease) 12/28/2016   Acute MI, lateral wall ([place])    Borderline diabetes 09/08/2013   BPPV (benign paroxysmal positional vertigo) 02/10/2013   Hyperlipidemia LDL goal <70 10/09/2009   Essential hypertension 10/09/2009   EXTERNAL HEMORRHOIDS WITHOUT MENTION COMP 07/24/2009   ALLERGIC  RHINITIS 02/03/2007    Past Medical History:  Diagnosis Date   Borderline diabetes    CAD (coronary artery disease), native coronary artery    a. STEMI 10/22/2016 s/p DES to the first diagonal with residual moderate disease in the LAD, right PDA and distal LAD.   Hyperlipidemia    Hypertension     Past Surgical History:  Procedure Laterality Date   CORONARY STENT INTERVENTION N/A 10/22/2016   Procedure: Coronary Stent Intervention;  Surgeon: Jettie Booze, MD;  Location: [place] [facility];  Service: Cardiovascular;  Laterality: N/A;   LEFT HEART CATH AND CORONARY ANGIOGRAPHY N/A 10/22/2016   Procedure: Left Heart Cath and Coronary Angiography;  Surgeon: Jettie Booze, MD;  Location: [place] [facility];  Service: Cardiovascular;  Laterality: N/A;    Family History  Problem Relation Age of Onset   Hypertension Father    Colon cancer Neg Hx    Esophageal cancer Neg Hx    Rectal cancer Neg Hx    Stomach cancer Neg Hx     Past Medical History, Surgical History, Social History, Family History, Problem List, Medications, and Allergies have been  reviewed and updated if relevant.  Review of Systems: Pertinent positives are listed above.  Otherwise, a full 14 point review of systems has been done in full and it is negative except where it is noted positive.  Objective:   BP 120/80   Pulse 62   Temp 98.3 F (36.8 C) (Temporal)   Ht 5\' 7"  (1.702 m)   Wt 177 lb (80.3 kg)   SpO2 98%   BMI 27.72 kg/m  Ideal Body Weight:  Weight in (lb) to have BMI = 25: 159.3  Ideal Body Weight: Weight in (lb) to have BMI = 25: 159.3 Vision Screening   Right eye Left eye Both eyes  Without correction 20/40 20/40 20/40   With correction 20/20 20/20 20/25    Depression screen [facility] 2/9 03/19/2021 02/16/2018  Decreased Interest 0 0  Down, Depressed, Hopeless 0 0  PHQ - 2 Score 0 0     GEN: well developed, well nourished, no acute distress Eyes: conjunctiva and lids normal, PERRLA, EOMI ENT: TM clear, nares clear, oral exam WNL Neck: supple, no lymphadenopathy, no thyromegaly, no JVD Pulm: clear to auscultation and percussion, respiratory effort normal CV: regular rate and rhythm, S1-S2, no murmur, rub or gallop, no bruits, peripheral pulses normal and symmetric, no cyanosis, clubbing, edema or varicosities GI: soft, non-tender; no hepatosplenomegaly, masses; active bowel sounds all quadrants GU: deferred Lymph: no cervical, axillary or inguinal adenopathy MSK: gait normal, muscle tone and strength WNL, no joint swelling, effusions, discoloration, crepitus  SKIN: clear, good turgor, color WNL, no rashes, lesions, or ulcerations Neuro: normal mental status, normal strength, sensation, and motion Psych: alert; oriented to person, place and time, normally interactive and not anxious or depressed in appearance.  All labs reviewed with patient. Results for orders placed or performed in visit on 03/19/21  POCT Urinalysis Dipstick (Automated)  Result Value Ref Range   Color, UA Yellow    Clarity, UA Clear    Glucose, UA Negative Negative   Bilirubin, UA Negative    Ketones, UA Negative    Spec Grav, UA 1.025 1.010 - 1.025   Blood, UA Small (1+)    pH, UA 5.5 5.0 - 8.0   Protein, UA Negative Negative   Urobilinogen, UA 0.2 0.2 or 1.0 E.U./dL   Nitrite, UA Negative    Leukocytes, UA Negative Negative   Results for orders placed or performed in visit on 94/85/46  Basic metabolic panel  Result Value Ref Range   Sodium 138  135 - 145 mEq/L   Potassium 4.3 3.5 - 5.1 mEq/L   Chloride 100 96 - 112 mEq/L   CO2 29 19 - 32 mEq/L   Glucose, Bld 88 70 - 99 mg/dL   BUN 14 6 - 23 mg/dL   Creatinine, Ser 1.04 0.40 - 1.50 mg/dL   GFR 81.15 >60.00 mL/min   Calcium 9.6 8.4 - 10.5 mg/dL  CBC with Differential/Platelet  Result Value Ref Range   WBC 7.3 4.0 - 10.5 K/uL   RBC 5.77 4.22 - 5.81 Mil/uL   Hemoglobin 16.8 13.0 - 17.0 g/dL   HCT 50.2 39.0 - 52.0 %   MCV 87.0 78.0 - 100.0 fl   MCHC 33.4 30.0 - 36.0 g/dL   RDW 12.8 11.5 - 15.5 %   Platelets 174.0 150.0 - 400.0 K/uL   Neutrophils Relative % 61.8 43.0 - 77.0 %   Lymphocytes Relative 26.1 12.0 - 46.0 %   Monocytes Relative 8.6 3.0 - 12.0 %   Eosinophils Relative  3.3 0.0 - 5.0 %   Basophils Relative 0.2 0.0 - 3.0 %   Neutro Abs 4.5 1.4 - 7.7 K/uL   Lymphs Abs 1.9 0.7 - 4.0 K/uL   Monocytes Absolute 0.6 0.1 - 1.0 K/uL   Eosinophils Absolute 0.2 0.0 - 0.7 K/uL   Basophils Absolute 0.0 0.0 - 0.1 K/uL  Hepatitis C antibody  Result Value Ref Range   Hepatitis C Ab NON-REACTIVE NON-REACTIVE   SIGNAL TO CUT-OFF 0.01 <1.00  PSA, Total with Reflex to PSA, Free  Result Value Ref Range   PSA, Total 1.6 < OR = 4.0 ng/mL  Rubella screen  Result Value Ref Range   Rubella <0.90 (L) Index  QuantiFERON-TB Gold Plus  Result Value Ref Range   QuantiFERON-TB Gold Plus POSITIVE (A) NEGATIVE   NIL 0.06 IU/mL   Mitogen-NIL >10.00 IU/mL   TB1-NIL 1.58 IU/mL   TB2-NIL <0.00 IU/mL  Urinalysis, microscopic only  Result Value Ref Range   WBC, UA 0-2/hpf 0-2/hpf   RBC / HPF none seen 0-2/hpf   Squamous Epithelial / LPF Rare(0-4/hpf) Rare(0-4/hpf)   Ca Oxalate Crys, UA Presence of (A) None   Amorphous Present (A) None;Present  POCT Urinalysis Dipstick (Automated)  Result Value Ref Range   Color, UA Yellow    Clarity, UA Clear    Glucose, UA Negative Negative   Bilirubin, UA Negative    Ketones, UA Negative    Spec Grav, UA 1.025 1.010 - 1.025   Blood, UA Small (1+)     pH, UA 5.5 5.0 - 8.0   Protein, UA Negative Negative   Urobilinogen, UA 0.2 0.2 or 1.0 E.U./dL   Nitrite, UA Negative    Leukocytes, UA Negative Negative     Assessment and Plan:     ICD-10-CM   1. Healthcare maintenance  Z00.00 POCT Urinalysis Dipstick (Automated)    Urinalysis, microscopic only    2. Screening for tuberculosis  Z11.1 QuantiFERON-TB Gold Plus    3. Screening for rubella  Z11.59 Rubella screen    4. Screening for prostate cancer  Z12.5 PSA, Total with Reflex to PSA, Free    5. Need for hepatitis C screening test  Z11.59 Hepatitis C antibody    6. Encounter for long-term (current) use of medications  G18.299 Basic metabolic panel    CBC with Differential/Platelet     He has forms for teaching driving.  There are multiple things needed to be eligible.  He is [place] non-immune, and he will need a booster shot. His TB screening is positive.  I am going to have to discuss this case with some of my colleagues.  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  Health Maintenance Exam: The patient's preventative maintenance and recommended screening tests for an annual wellness exam were reviewed in full today. Brought up to date unless services declined.  Counselled on the importance of diet, exercise, and its role in overall health and mortality. The patient's FH and SH was reviewed, including their home life, tobacco status, and drug and alcohol status.  Follow-up in 1 year for physical exam or additional follow-up below.  Follow-up: No follow-ups on file. Or follow-up in 1 year if not noted.  Meds ordered this encounter  Medications   permethrin (ELIMITE) 5 % cream    Sig: Apply all over body and leave on for 12 hours, then was off    Dispense:  60 g    Refill:  0  There are no discontinued medications. Orders Placed This Encounter  Procedures   Basic metabolic panel   CBC with Differential/Platelet   Hepatitis C  antibody   PSA, Total with Reflex to PSA, Free   Rubella screen   QuantiFERON-TB Gold Plus   Urinalysis, microscopic only   POCT Urinalysis Dipstick (Automated)    Signed,  Jahseh Lucchese T. Keiandra Sullenger, MD   Allergies as of 03/19/2021   No Known Allergies      Medication List        Accurate as of March 19, 2021  4:23 PM. If you have any questions, ask your nurse or doctor.          albuterol 108 (90 Base) MCG/ACT inhaler Commonly known as: VENTOLIN HFA TAKE 2 PUFFS BY MOUTH EVERY 6 HOURS AS NEEDED FOR WHEEZE OR SHORTNESS OF BREATH   atorvastatin 80 MG tablet Commonly known as: LIPITOR Take 1 tablet (80 mg total) by mouth daily at 6 PM.   Brilinta 90 MG Tabs tablet Generic drug: ticagrelor TAKE 1 TAB BY MOUTH TWICE A DAY. KEEP UPCOMING APPT JUNE 2022 WITH DR Irish Lack BEFORE ANY REFILLS   lisinopril 20 MG tablet Commonly known as: ZESTRIL TAKE 1 TABLET BY MOUTH EVERY DAY   metoprolol tartrate 25 MG tablet Commonly known as: LOPRESSOR TAKE 1/2 TABLET BY MOUTH 2 TIMES DAILY.   nitroGLYCERIN 0.4 MG SL tablet Commonly known as: NITROSTAT PLACE 1 TABLET (0.4 MG TOTAL) UNDER THE TONGUE EVERY 5 (FIVE) MINUTES AS NEEDED.   permethrin 5 % cream Commonly known as: ELIMITE Apply all over body and leave on for 12 hours, then was off Started by: Owens Loffler, MD   sildenafil 20 MG tablet Commonly known as: REVATIO TAKE 2 TO 5 TABLETS (40-100MG ) BY MOUTH 30 MINUTES PRIOR TO INTERCOURSE AS DIRECTED

## 2021-03-20 LAB — HEPATITIS C ANTIBODY
Hepatitis C Ab: NONREACTIVE
SIGNAL TO CUT-OFF: 0.01 (ref <1.00)

## 2021-03-20 LAB — BASIC METABOLIC PANEL
BUN: 14 mg/dL (ref 6–23)
CO2: 29 mEq/L (ref 19–32)
Calcium: 9.6 mg/dL (ref 8.4–10.5)
Chloride: 100 mEq/L (ref 96–112)
Creatinine, Ser: 1.04 mg/dL (ref 0.40–1.50)
GFR: 81.15 mL/min (ref >60.00)
Glucose, Bld: 88 mg/dL (ref 70–99)
Potassium: 4.3 mEq/L (ref 3.5–5.1)
Sodium: 138 mEq/L (ref 135–145)

## 2021-03-20 LAB — URINALYSIS, MICROSCOPIC ONLY: RBC / HPF: NONE SEEN (ref 0–?)

## 2021-03-20 LAB — CBC WITH DIFFERENTIAL/PLATELET
Basophils Absolute: 0 10*3/uL (ref 0.0–0.1)
Basophils Relative: 0.2 % (ref 0.0–3.0)
Eosinophils Absolute: 0.2 10*3/uL (ref 0.0–0.7)
Eosinophils Relative: 3.3 % (ref 0.0–5.0)
HCT: 50.2 % (ref 39.0–52.0)
Hemoglobin: 16.8 g/dL (ref 13.0–17.0)
Lymphocytes Relative: 26.1 % (ref 12.0–46.0)
Lymphs Abs: 1.9 10*3/uL (ref 0.7–4.0)
MCHC: 33.4 g/dL (ref 30.0–36.0)
MCV: 87 fl (ref 78.0–100.0)
Monocytes Absolute: 0.6 10*3/uL (ref 0.1–1.0)
Monocytes Relative: 8.6 % (ref 3.0–12.0)
Neutro Abs: 4.5 10*3/uL (ref 1.4–7.7)
Neutrophils Relative %: 61.8 % (ref 43.0–77.0)
Platelets: 174 10*3/uL (ref 150.0–400.0)
RBC: 5.77 Mil/uL (ref 4.22–5.81)
RDW: 12.8 % (ref 11.5–15.5)
WBC: 7.3 10*3/uL (ref 4.0–10.5)

## 2021-03-20 LAB — PSA, TOTAL WITH REFLEX TO PSA, FREE: PSA, Total: 1.6 ng/mL (ref <=4.0)

## 2021-03-20 LAB — RUBELLA SCREEN: Rubella: <0.9 Index — ABNORMAL LOW

## 2021-03-23 LAB — QUANTIFERON-TB GOLD PLUS
Mitogen-NIL: >10 IU/mL
NIL: 0.06 IU/mL
QuantiFERON-TB Gold Plus: POSITIVE — AB
TB1-NIL: 1.58 IU/mL
TB2-NIL: <0 IU/mL

## 2021-03-26 ENCOUNTER — Other Ambulatory Visit: Payer: Self-pay | Admitting: Interventional Cardiology

## 2021-04-02 ENCOUNTER — Telehealth: Payer: Self-pay | Admitting: Family Medicine

## 2021-04-02 DIAGNOSIS — R7611 Nonspecific reaction to tuberculin skin test without active tuberculosis: Secondary | ICD-10-CM

## 2021-04-02 NOTE — Telephone Encounter (Signed)
Pt is calling in checking on the status of a form being filled out concerning his last dos 03/19/21 a cpe. Please call pt when complete at [phone], he is wanting to come by and pick up.

## 2021-04-02 NOTE — Telephone Encounter (Signed)
Pt

## 2021-04-03 NOTE — Telephone Encounter (Signed)
Pt called to follow up on CPE form

## 2021-04-06 ENCOUNTER — Other Ambulatory Visit: Payer: Self-pay | Admitting: Interventional Cardiology

## 2021-04-07 NOTE — Telephone Encounter (Signed)
He can get MMR  The TB test is more difficult - I have never had one positive before.  I do not think that he would have to stop work, but I think that the health department can help direct.  I spoke to a colleague in ID over the weekend, and I think that is what we need to do.

## 2021-04-07 NOTE — Telephone Encounter (Signed)
Steven Kemp notified as instructed by telephone.  He is agreeable to what ever he needs to get done.  We only have MMR vaccine.  Okay for him to get the MMR or does he need to go to HD for just the Rubella vaccine?  Also,  have we heard back from HD on what needs to be done for positive TB test?  Is it okay for him to continue going into work at the [employer] since he has a positive TB test?

## 2021-04-07 NOTE — Telephone Encounter (Signed)
Can you call him?  We need to get him set up for a Rubella vaccination. His antibody titer for Rubella showed that he was non-immune.  His TB screening test came back positive, so I am going to have to get the health department to help follow about this.  It is very difficult to know if this is a false positive or a true positive, so we are going to need assistance.   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   Some of this might be confusing, so I can call him if he would like.

## 2021-04-07 NOTE — Telephone Encounter (Signed)
Spoke with Steven Kemp.  MMR scheduled for 04/10/2021 at 10:30 am on nurse schedule at [facility].  He is aware that Dr. Lorelei Pont is working on next step for positive Quantiferon-TB gold test with ID and Health Department.

## 2021-04-08 NOTE — Telephone Encounter (Signed)
Can you let Steven Kemp know that the health department wanted me to repeat the blood TB test.  Get at the same time as his MMR vaccine.  I am not sure if he needs a lab appointment?  They thought it was probably a false positive.  If the test is negative, nothing to do.  if it does come back positive again, then they can help setting up some additional testing at the health department.

## 2021-04-08 NOTE — Telephone Encounter (Signed)
Mr. Sahli notified as instructed by telephone.  Lab appointment scheduled at [facility] 04/10/2021 at 11:05 am.  Future orders in Epic.

## 2021-04-10 ENCOUNTER — Ambulatory Visit (INDEPENDENT_AMBULATORY_CARE_PROVIDER_SITE_OTHER): Payer: BC Managed Care – PPO

## 2021-04-10 ENCOUNTER — Other Ambulatory Visit (INDEPENDENT_AMBULATORY_CARE_PROVIDER_SITE_OTHER): Payer: BC Managed Care – PPO

## 2021-04-10 ENCOUNTER — Other Ambulatory Visit: Payer: Self-pay

## 2021-04-10 DIAGNOSIS — Z23 Encounter for immunization: Secondary | ICD-10-CM

## 2021-04-10 DIAGNOSIS — R7611 Nonspecific reaction to tuberculin skin test without active tuberculosis: Secondary | ICD-10-CM | POA: Diagnosis not present

## 2021-04-14 LAB — QUANTIFERON-TB GOLD PLUS
Mitogen-NIL: >10 IU/mL
NIL: 0.02 IU/mL
QuantiFERON-TB Gold Plus: POSITIVE — AB
TB1-NIL: 0.92 IU/mL
TB2-NIL: 0.02 IU/mL

## 2021-06-17 IMAGING — DX DG CHEST 2V
2 series · 2 of 2 positions shown · non-contrast
Comparison: September 30, 2020

CLINICAL DATA: Chest pain and left arm pain.

EXAM:
CHEST - 2 VIEW

[chest pa]
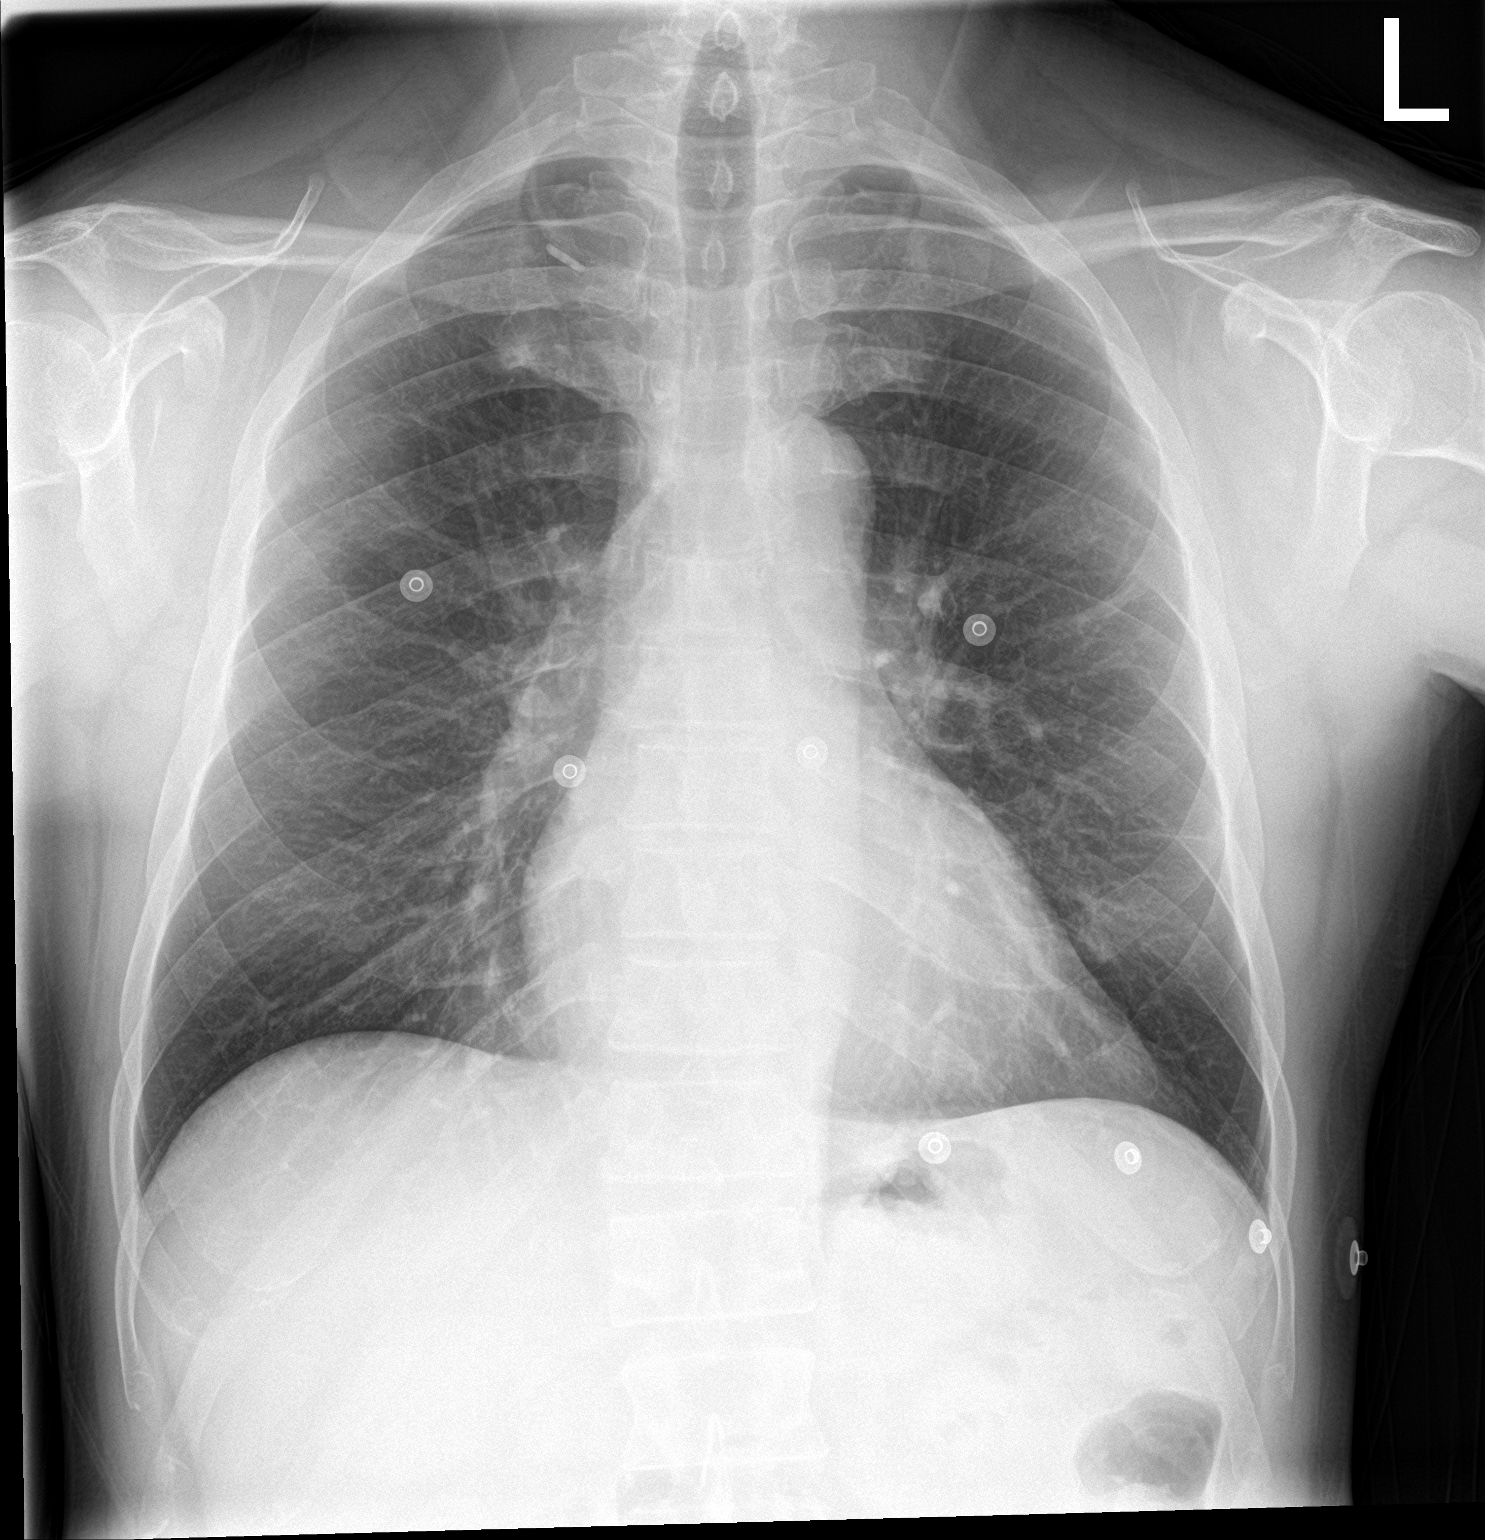

[chest lat]
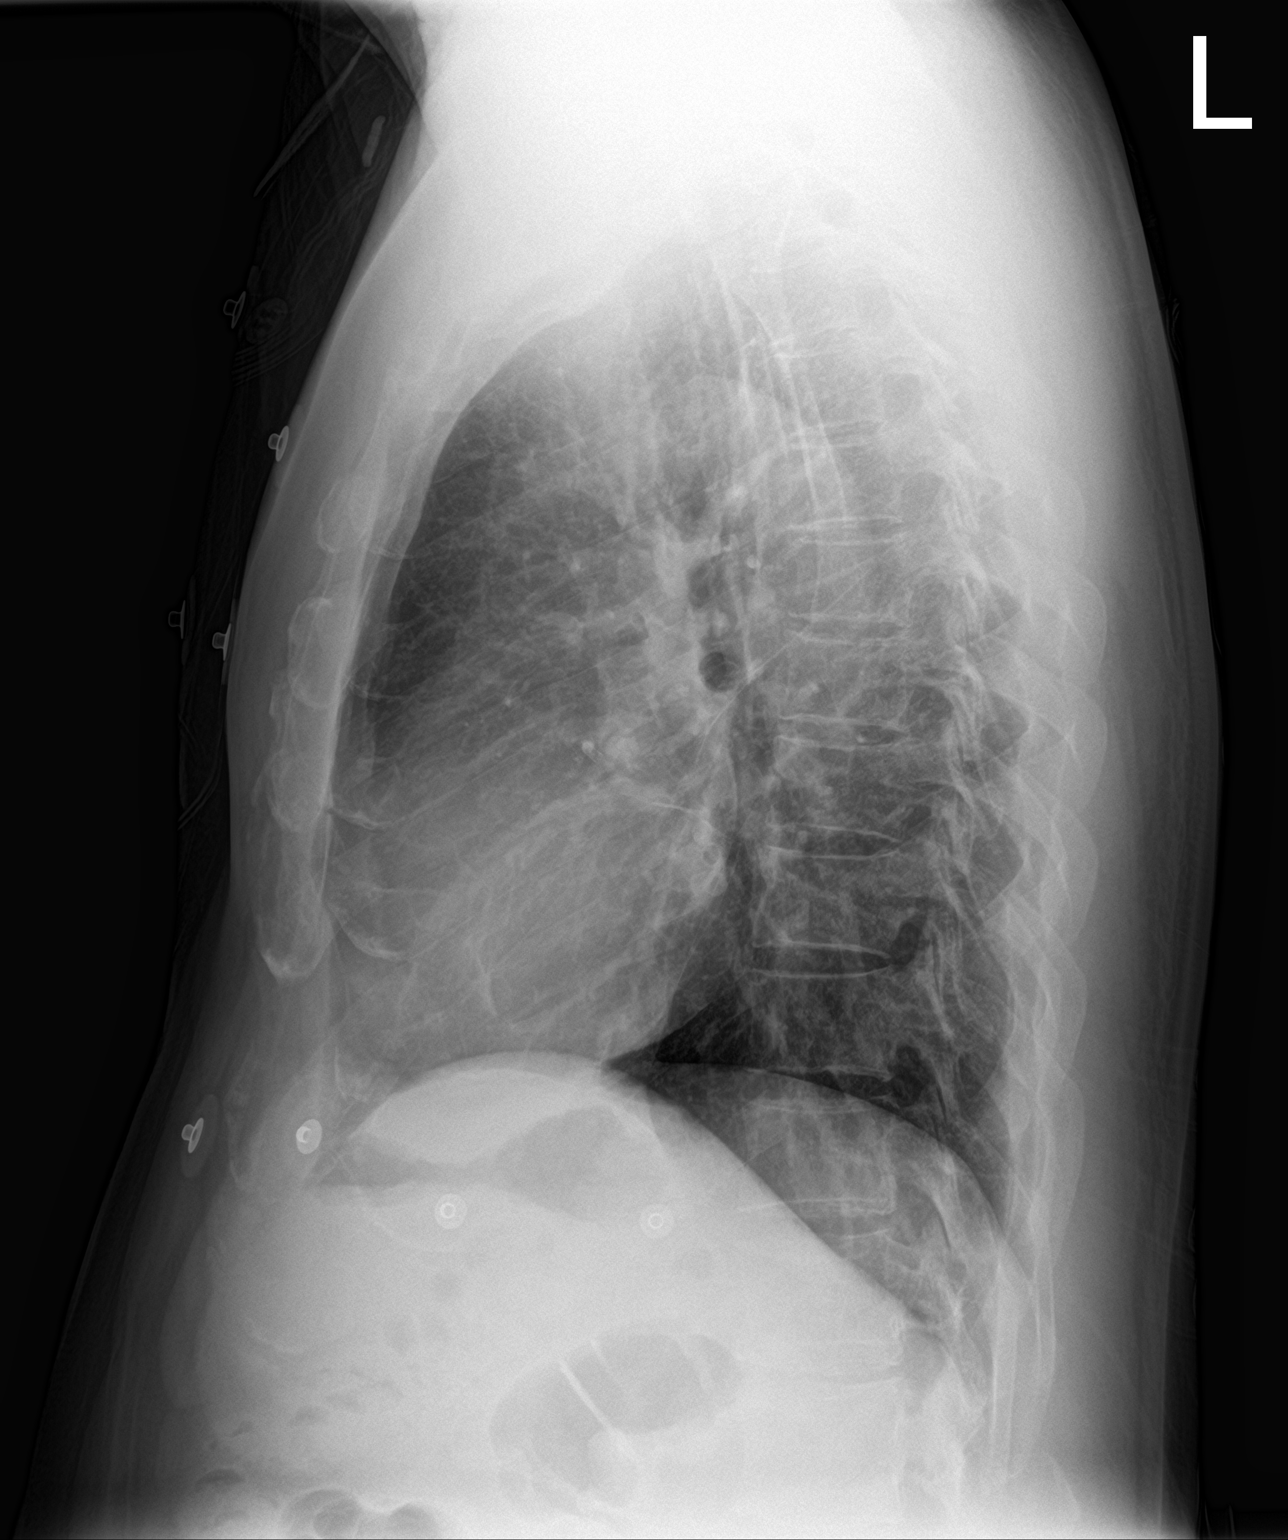

[2 of 2 positions shown; findings below may reference images not displayed]

FINDINGS: The heart size and mediastinal contours are within normal limits.
Both lungs are clear. The visualized skeletal structures are
unremarkable.
IMPRESSION: No active cardiopulmonary disease.

## 2021-07-02 ENCOUNTER — Other Ambulatory Visit: Payer: Self-pay

## 2021-07-02 ENCOUNTER — Encounter: Payer: Self-pay | Admitting: Family Medicine

## 2021-07-02 ENCOUNTER — Telehealth: Payer: BC Managed Care – PPO | Admitting: Family Medicine

## 2021-07-02 VITALS — Ht 67.0 in

## 2021-07-02 DIAGNOSIS — H10023 Other mucopurulent conjunctivitis, bilateral: Secondary | ICD-10-CM

## 2021-07-02 MED ORDER — POLYMYXIN B-TRIMETHOPRIM 10000-0.1 UNIT/ML-% OP SOLN: 1.0000 [drp] | OPHTHALMIC | 0 refills | Status: DC

## 2021-07-02 NOTE — Progress Notes (Signed)
° ° ° ° °  [name], MD Primary Care and Sports Medicine [facility] at [facility] [place], [address] Phone: [phone]   FAX: [phone]  Steven Kemp - 56 y.o. male   MRN [id number]   Date of Birth: 1965/10/29  Visit Date: 07/02/2021   PCP: Owens Loffler, MD   Referred by: Owens Loffler, MD  Virtual Visit via Video Note:  I connected with  Steven Kemp on 07/02/2021 11:40 AM EST by a video enabled telemedicine application and verified that I am speaking with the correct person using two identifiers.   Location patient: home computer, tablet, or smartphone Location provider: work or home office Consent: Verbal consent directly obtained from Steven Kemp. Persons participating in the virtual visit: patient, provider  I discussed the limitations of evaluation and management by telemedicine and the availability of in person appointments. The patient expressed understanding and agreed to proceed.  Chief Complaint  Patient presents with   Conjunctivitis    History of Present Illness:  I was not able to speak to him.  Sent in polytrim.  No charge.

## 2021-08-26 ENCOUNTER — Other Ambulatory Visit: Payer: Self-pay

## 2021-08-26 ENCOUNTER — Encounter (HOSPITAL_COMMUNITY): Payer: Self-pay

## 2021-08-26 ENCOUNTER — Ambulatory Visit (HOSPITAL_COMMUNITY)
Admission: EM | Admit: 2021-08-26 | Discharge: 2021-08-26 | Disposition: A | Discharge status: Home / Self Care | Payer: BC Managed Care – PPO | Attending: Nurse Practitioner | Admitting: Nurse Practitioner

## 2021-08-26 DIAGNOSIS — J029 Acute pharyngitis, unspecified: Secondary | ICD-10-CM | POA: Insufficient documentation

## 2021-08-26 DIAGNOSIS — J069 Acute upper respiratory infection, unspecified: Secondary | ICD-10-CM | POA: Diagnosis not present

## 2021-08-26 DIAGNOSIS — Z20822 Contact with and (suspected) exposure to covid-19: Secondary | ICD-10-CM | POA: Diagnosis not present

## 2021-08-26 LAB — POCT RAPID STREP A, ED / UC: Streptococcus, Group A Screen (Direct): POSITIVE — AB

## 2021-08-26 LAB — SARS CORONAVIRUS 2 (TAT 6-24 HRS): SARS Coronavirus 2: NEGATIVE

## 2021-08-26 MED ORDER — AMOXICILLIN 500 MG PO CAPS: 500.0000 mg | ORAL_CAPSULE | Freq: Two times a day (BID) | ORAL | 0 refills | Status: AC

## 2021-08-26 NOTE — ED Provider Notes (Signed)
[facility]    CSN: [id number] Arrival date & time: 08/26/21  9485      History   Chief Complaint Chief Complaint  Patient presents with   Sore Throat    HPI Steven Kemp is a 56 y.o. male.   Patient reports 1 day history of body aches, fever, sore throat.  Reports occasional dry cough.  Denies shortness of breath, wheezing, chest pain, congestion, runny nose, swollen glands, headache, ear pain, nausea, vomiting, diarrhea, change in appetite.  Reports he is more tired than normal.  He also reports he works at the [employer] and is regularly exposed to the community.  He has tried Mucinex with some relief of symptoms.    Past Medical History:  Diagnosis Date   Borderline diabetes    CAD (coronary artery disease), native coronary artery    a. STEMI 10/22/2016 s/p DES to the first diagonal with residual moderate disease in the LAD, right PDA and distal LAD.   Hyperlipidemia    Hypertension     Patient Active Problem List   Diagnosis Date Noted   Coughing 07/17/2019   CAD (coronary artery disease) 12/28/2016   Acute MI, lateral wall ([place])    Borderline diabetes 09/08/2013   BPPV (benign paroxysmal positional vertigo) 02/10/2013   Hyperlipidemia LDL goal <70 10/09/2009   Essential hypertension 10/09/2009   EXTERNAL HEMORRHOIDS WITHOUT MENTION COMP 07/24/2009   ALLERGIC  RHINITIS 02/03/2007    Past Surgical History:  Procedure Laterality Date   CORONARY STENT INTERVENTION N/A 10/22/2016   Procedure: Coronary Stent Intervention;  Surgeon: Jettie Booze, MD;  Location: [place] [facility];  Service: Cardiovascular;  Laterality: N/A;   LEFT HEART CATH AND CORONARY ANGIOGRAPHY N/A 10/22/2016   Procedure: Left Heart Cath and Coronary Angiography;  Surgeon: Jettie Booze, MD;  Location: [place] [facility];  Service: Cardiovascular;  Laterality: N/A;       Home Medications    Prior to Admission medications   Medication Sig Start Date End Date Taking?  Authorizing Provider  atorvastatin (LIPITOR) 80 MG tablet Take 1 tablet (80 mg total) by mouth daily at 6 PM. 10/31/20   Jettie Booze, MD  BRILINTA 90 MG TABS tablet TAKE 1 TAB BY MOUTH TWICE A DAY. KEEP UPCOMING APPT JUNE 2022 WITH DR Irish Lack BEFORE ANY REFILLS 01/13/21   Jettie Booze, MD  lisinopril (ZESTRIL) 20 MG tablet TAKE 1 TABLET BY MOUTH EVERY DAY 03/27/21   Jettie Booze, MD  metoprolol tartrate (LOPRESSOR) 25 MG tablet TAKE 1/2 TABLET BY MOUTH 2 TIMES DAILY. 11/12/20   Jettie Booze, MD  nitroGLYCERIN (NITROSTAT) 0.4 MG SL tablet PLACE 1 TABLET UNDER THE TONGUE EVERY 5 MINUTES AS NEEDED. 04/07/21   Jettie Booze, MD  sildenafil (REVATIO) 20 MG tablet TAKE 2 TO 5 TABLETS (40-'100MG'$ ) BY MOUTH 30 MINUTES PRIOR TO INTERCOURSE AS DIRECTED 03/19/21   Copland, Frederico Hamman, MD  trimethoprim-polymyxin b (POLYTRIM) ophthalmic solution Place 1 drop into both eyes every 4 (four) hours. 07/02/21   Copland, Frederico Hamman, MD    Family History Family History  Problem Relation Age of Onset   Hypertension Father    Colon cancer Neg Hx    Esophageal cancer Neg Hx    Rectal cancer Neg Hx    Stomach cancer Neg Hx     Social History Social History   Tobacco Use   Smoking status: Never   Smokeless tobacco: Never  Vaping Use   Vaping Use: Never used  Substance Use Topics   Alcohol use: Yes    Comment: very rare   Drug use: No     Allergies   Patient has no known allergies.   Review of Systems Review of Systems Per HPI  Physical Exam Triage Vital Signs ED Triage Vitals  Enc Vitals Group     BP 08/26/21 0837 127/83     Pulse Rate 08/26/21 0837 92     Resp 08/26/21 0837 18     Temp 08/26/21 0837 98.5 F (36.9 C)     Temp Source 08/26/21 0837 Oral     SpO2 08/26/21 0837 95 %     Weight --      Height --      Head Circumference --      Peak Flow --      Pain Score 08/26/21 0836 6     Pain Loc --      Pain Edu? --      Excl. in [place]? --    No data  found.  Updated Vital Signs BP 127/83 (BP Location: Left Arm)    Pulse 92    Temp 98.5 F (36.9 C) (Oral)    Resp 18    SpO2 95%   Visual Acuity Right Eye Distance:   Left Eye Distance:   Bilateral Distance:    Right Eye Near:   Left Eye Near:    Bilateral Near:     Physical Exam Vitals and nursing note reviewed.  Constitutional:      General: He is not in acute distress.    Appearance: He is well-developed. He is not toxic-appearing.  HENT:     Head: Normocephalic and atraumatic.     Right Ear: Tympanic membrane and ear canal normal. No middle ear effusion. Tympanic membrane is not erythematous.     Left Ear: Tympanic membrane and ear canal normal.  No middle ear effusion. Tympanic membrane is not erythematous.     Nose: No congestion or rhinorrhea.     Mouth/Throat:     Mouth: Mucous membranes are moist.     Pharynx: Posterior oropharyngeal erythema present. No oropharyngeal exudate.     Tonsils: No tonsillar exudate or tonsillar abscesses. 2+ on the right. 2+ on the left.  Cardiovascular:     Rate and Rhythm: Normal rate and regular rhythm.  Pulmonary:     Effort: Pulmonary effort is normal. No respiratory distress.     Breath sounds: Normal breath sounds. No wheezing, rhonchi or rales.  Abdominal:     General: Bowel sounds are normal. There is no distension.     Palpations: Abdomen is soft.     Tenderness: There is no abdominal tenderness.  Skin:    General: Skin is warm and dry.     Capillary Refill: Capillary refill takes less than 2 seconds.     Coloration: Skin is not pale.     Findings: No erythema or rash.  Neurological:     General: No focal deficit present.     Mental Status: He is alert and oriented to person, place, and time.     UC Treatments / Results  Labs (all labs ordered are listed, but only abnormal results are displayed) Labs Reviewed  CULTURE, GROUP A STREP ([place])  SARS CORONAVIRUS 2 (TAT 6-24 HRS)  POCT RAPID STREP A, ED / UC     EKG   Radiology No results found.  Procedures Procedures (including critical care time)  Medications Ordered in UC Medications -  No data to display  Initial Impression / Assessment and Plan / UC Course  I have reviewed the triage vital signs and the nursing notes.  Pertinent labs & imaging results that were available during my care of the patient were reviewed by me and considered in my medical decision making (see chart for details).    Will obtain strep and COVID testing today.  Treat as indicated; he would be a good candidate for antiviral therapy if COVID +.  Reassured patient that symptoms and exam findings are most consistent with a viral upper respiratory infection and explained lack of efficacy of antibiotics against viruses.  Discussed expected course and features suggestive of secondary bacterial infection.  Continue supportive care. Increase fluid intake with water or electrolyte solution like pedialyte. Encouraged acetaminophen as needed for fever/pain. Encouraged salt water gargling, chloraseptic spray and throat lozenges. Encouraged OTC guaifenesin. Encouraged saline sinus flushes and/or neti with humidified air.  Follow up with PCP if symptoms persist more than 7 days without improvement.  If symptoms worsen or with any sudden onset new chest pain, dizziness, sweating, or shortness of breath, go to ED.  Note given for work.    Final Clinical Impressions(s) / UC Diagnoses   Final diagnoses:  Acute pharyngitis, unspecified etiology  Acute upper respiratory infection     Discharge Instructions      We will let you know with any positive test results.  Your symptoms and exam findings are most consistent with a viral upper respiratory infection. These usually run their course in 5-7 days. Some things that can make you feel better are: - Increased rest - Increasing fluid with water/sugar free electrolytes - Acetaminophen as needed for fever/pain.  - Salt water  gargling, chloraseptic spray and throat lozenges - OTC guaifenesin (Mucinex).  - Saline sinus flushes or a neti pot.  - Humidifying the air. Please stay out of work until we have the results of the COVID-19 test.      ED Prescriptions   None    PDMP not reviewed this encounter.   Eulogio Bear, NP 08/26/21 [phone]

## 2021-08-26 NOTE — Discharge Instructions (Addendum)
We will let you know with any positive test results.  Your symptoms and exam findings are most consistent with a viral upper respiratory infection. These usually run their course in 5-7 days. Some things that can make you feel better are: ?- Increased rest ?- Increasing fluid with water/sugar free electrolytes ?- Acetaminophen as needed for fever/pain.  ?- Salt water gargling, chloraseptic spray and throat lozenges ?- OTC guaifenesin (Mucinex).  ?- Saline sinus flushes or a neti pot.  ?- Humidifying the air. ?Please stay out of work until we have the results of the COVID-19 test.  ?

## 2021-08-26 NOTE — ED Triage Notes (Signed)
Onset yesterday of body aches, sore throat with dysphagia, fatigue and productive cough. Has been taking mucinex. No v/d.  ?

## 2021-08-27 LAB — CULTURE, GROUP A STREP (THRC)

## 2021-09-01 ENCOUNTER — Other Ambulatory Visit: Payer: Self-pay | Admitting: Interventional Cardiology

## 2021-09-19 ENCOUNTER — Telehealth: Payer: Self-pay | Admitting: Family Medicine

## 2021-09-19 NOTE — Telephone Encounter (Signed)
Physical Exam Report form placed in Dr. Lillie Fragmin office in box to complete.  ?

## 2021-09-19 NOTE — Telephone Encounter (Signed)
Pt brought in some paperwork to be filled out ?Type of forms received:cpe examination report ? ?Routed LS:LHTDS ? ?Paperwork received by : terrill ? ? ?Individual made aware of 3-5 business day turn around (Y/N): ?y ?Form completed and patient made aware of charges(Y/N): ?y ? ?Faxed to :  ? ?Form location:  dr copland box ?

## 2021-09-23 ENCOUNTER — Other Ambulatory Visit: Payer: Self-pay | Admitting: Interventional Cardiology

## 2021-09-24 ENCOUNTER — Other Ambulatory Visit: Payer: Self-pay | Admitting: Interventional Cardiology

## 2021-10-09 NOTE — Telephone Encounter (Signed)
Mr. Busbee notified paperwork is ready to be picked up at the front desk. ?

## 2021-11-20 ENCOUNTER — Emergency Department (HOSPITAL_COMMUNITY): Payer: BC Managed Care – PPO

## 2021-11-20 ENCOUNTER — Emergency Department (HOSPITAL_COMMUNITY)
Admission: EM | Admit: 2021-11-20 | Discharge: 2021-11-20 | Disposition: A | Discharge status: Home / Self Care | Payer: BC Managed Care – PPO | Attending: Emergency Medicine | Admitting: Emergency Medicine

## 2021-11-20 ENCOUNTER — Encounter (HOSPITAL_COMMUNITY): Payer: Self-pay

## 2021-11-20 ENCOUNTER — Other Ambulatory Visit: Payer: Self-pay

## 2021-11-20 DIAGNOSIS — I10 Essential (primary) hypertension: Secondary | ICD-10-CM | POA: Insufficient documentation

## 2021-11-20 DIAGNOSIS — Z79899 Other long term (current) drug therapy: Secondary | ICD-10-CM | POA: Insufficient documentation

## 2021-11-20 DIAGNOSIS — I2510 Atherosclerotic heart disease of native coronary artery without angina pectoris: Secondary | ICD-10-CM | POA: Insufficient documentation

## 2021-11-20 DIAGNOSIS — R079 Chest pain, unspecified: Secondary | ICD-10-CM

## 2021-11-20 LAB — CBC
HCT: 50.4 % (ref 39.0–52.0)
Hemoglobin: 17 g/dL (ref 13.0–17.0)
MCH: 29.3 pg (ref 26.0–34.0)
MCHC: 33.7 g/dL (ref 30.0–36.0)
MCV: 86.7 fL (ref 80.0–100.0)
Platelets: 206 10*3/uL (ref 150–400)
RBC: 5.81 MIL/uL (ref 4.22–5.81)
RDW: 12.3 % (ref 11.5–15.5)
WBC: 7.3 10*3/uL (ref 4.0–10.5)
nRBC: 0 % (ref 0.0–0.2)

## 2021-11-20 LAB — BASIC METABOLIC PANEL
Anion gap: 8 (ref 5–15)
BUN: 12 mg/dL (ref 6–20)
CO2: 23 mmol/L (ref 22–32)
Calcium: 9.4 mg/dL (ref 8.9–10.3)
Chloride: 104 mmol/L (ref 98–111)
Creatinine, Ser: 0.89 mg/dL (ref 0.61–1.24)
GFR, Estimated: >60 mL/min (ref >60)
Glucose, Bld: 103 mg/dL — ABNORMAL HIGH (ref 70–99)
Potassium: 4.2 mmol/L (ref 3.5–5.1)
Sodium: 135 mmol/L (ref 135–145)

## 2021-11-20 LAB — TROPONIN I (HIGH SENSITIVITY)
Troponin I (High Sensitivity): 3 ng/L (ref <18)
Troponin I (High Sensitivity): 4 ng/L (ref <18)

## 2021-11-20 NOTE — ED Triage Notes (Signed)
Pt BIB [facility] from home c/o centralized CP that started at 3am this morning that radiates down his left arm. Pt took 1 nitro at 8am and another at 12pm. Pt has a hx of MI and decided to come get checked out.

## 2021-11-20 NOTE — ED Provider Notes (Signed)
[facility] EMERGENCY DEPARTMENT Provider Note   CSN: [id number] Arrival date & time: 11/20/21  1346     History  Chief Complaint  Patient presents with   Chest Pain    Steven Kemp is a 56 y.o. male.   Chest Pain Associated symptoms: no fever    HPI: A 56 year old patient with a history of hypertension and hypercholesterolemia presents for evaluation of chest pain. Initial onset of pain was more than 6 hours ago. The patient's chest pain is sharp and is not worse with exertion. The patient's chest pain is middle- or left-sided, is not well-localized, is not described as heaviness/pressure/tightness and does radiate to the arms/jaw/neck. The patient does not complain of nausea and denies diaphoresis. The patient has no history of stroke, has no history of peripheral artery disease, has not smoked in the past 90 days, denies any history of treated diabetes, has no relevant family history of coronary artery disease (first degree relative at less than age 69) and does not have an elevated BMI (>=30).   Home Medications Prior to Admission medications   Medication Sig Start Date End Date Taking? Authorizing Provider  atorvastatin (LIPITOR) 80 MG tablet Take 1 tablet (80 mg total) by mouth daily at 6 PM. 10/31/20   Jettie Booze, MD  BRILINTA 90 MG TABS tablet TAKE 1 TAB BY MOUTH TWICE A DAY. 09/23/21   Jettie Booze, MD  lisinopril (ZESTRIL) 20 MG tablet TAKE 1 TABLET BY MOUTH EVERY DAY 03/27/21   Jettie Booze, MD  metoprolol tartrate (LOPRESSOR) 25 MG tablet TAKE 1/2 TABLET BY MOUTH 2 TIMES DAILY 09/24/21   Jettie Booze, MD  nitroGLYCERIN (NITROSTAT) 0.4 MG SL tablet PLACE 1 TABLET UNDER THE TONGUE EVERY 5 MINUTES AS NEEDED. 09/01/21   Jettie Booze, MD  sildenafil (REVATIO) 20 MG tablet TAKE 2 TO 5 TABLETS (40-'100MG'$ ) BY MOUTH 30 MINUTES PRIOR TO INTERCOURSE AS DIRECTED 03/19/21   Copland, Frederico Hamman, MD  trimethoprim-polymyxin b (POLYTRIM)  ophthalmic solution Place 1 drop into both eyes every 4 (four) hours. 07/02/21   Owens Loffler, MD      Allergies    Patient has no known allergies.    Review of Systems   Review of Systems  Constitutional:  Negative for fever.  Cardiovascular:  Positive for chest pain.   Physical Exam Updated Vital Signs BP (!) 147/82   Pulse 62   Temp 98.1 F (36.7 C)   Resp 18   Ht 1.727 m ('5\' 8"'$ )   Wt 82.1 kg   SpO2 99%   BMI 27.52 kg/m  Physical Exam Vitals and nursing note reviewed.  Constitutional:      General: He is not in acute distress.    Appearance: He is well-developed.  HENT:     Head: Normocephalic and atraumatic.     Right Ear: External ear normal.     Left Ear: External ear normal.  Eyes:     General: No scleral icterus.       Right eye: No discharge.        Left eye: No discharge.     Conjunctiva/sclera: Conjunctivae normal.  Neck:     Trachea: No tracheal deviation.  Cardiovascular:     Rate and Rhythm: Normal rate and regular rhythm.  Pulmonary:     Effort: Pulmonary effort is normal. No respiratory distress.     Breath sounds: Normal breath sounds. No stridor. No wheezing or rales.  Abdominal:  General: Bowel sounds are normal. There is no distension.     Palpations: Abdomen is soft.     Tenderness: There is no abdominal tenderness. There is no guarding or rebound.  Musculoskeletal:        General: No tenderness or deformity.     Cervical back: Neck supple.  Skin:    General: Skin is warm and dry.     Findings: No rash.  Neurological:     General: No focal deficit present.     Mental Status: He is alert.     Cranial Nerves: No cranial nerve deficit (no facial droop, extraocular movements intact, no slurred speech).     Sensory: No sensory deficit.     Motor: No abnormal muscle tone or seizure activity.     Coordination: Coordination normal.  Psychiatric:        Mood and Affect: Mood normal.    ED Results / Procedures / Treatments    Labs (all labs ordered are listed, but only abnormal results are displayed) Labs Reviewed  BASIC METABOLIC PANEL - Abnormal; Notable for the following components:      Result Value   Glucose, Bld 103 (*)    All other components within normal limits  CBC  TROPONIN I (HIGH SENSITIVITY)  TROPONIN I (HIGH SENSITIVITY)    EKG EKG Interpretation  Date/Time:  Thursday November 20 2021 13:46:10 EDT Ventricular Rate:  59 PR Interval:  132 QRS Duration: 92 QT Interval:  364 QTC Calculation: 360 R Axis:   52 Text Interpretation: Sinus bradycardia Septal infarct , age undetermined Abnormal ECG When compared with ECG of 28-Oct-2020 16:18, No significant change since last tracing Confirmed by Dorie Rank [id number]) on 11/20/2021 4:00:25 PM  Radiology DG Chest 1 View  Result Date: 11/20/2021 CLINICAL DATA:  Chest pain EXAM: CHEST  1 VIEW COMPARISON:  10/28/2020 FINDINGS: Heart size upper limits of normal. Mediastinal shadows are normal. The lungs are clear. The vascularity is normal. No effusions. No significant bone finding. IMPRESSION: No active disease. Electronically Signed   By: Nelson Chimes M.D.   On: 11/20/2021 14:30    Procedures Procedures    Medications Ordered in ED Medications - No data to display  ED Course/ Medical Decision Making/ A&P Clinical Course as of 11/20/21 1809  Thu Nov 20, 2021  1605 Troponin I (High Sensitivity) nl [JK]  0347 Basic metabolic panel(!) nl [JK]  1606 CBC nl [JK]    Clinical Course User Index [JK] Dorie Rank, MD   HEAR Score: 3                       Medical Decision Making Problems Addressed: Chest pain, unspecified type: complicated acute illness or injury Coronary artery disease involving native coronary artery of native heart without angina pectoris: chronic illness or injury Hypertension, unspecified type: chronic illness or injury  Amount and/or Complexity of Data Reviewed External Data Reviewed: notes.    Details: Cardiology notes Labs:  ordered. Decision-making details documented in ED Course. Radiology: ordered and independent interpretation performed.    Details: No acute findings noted on x-ray   Patient presented to the ED for evaluation of chest pain.  Patient has history of cardiac disease but symptoms were different today than his prior anginal symptoms.  ED work-up is reassuring.  No signs of cardiac ischemia.  Low risk heart score.  At this point he seems appropriate for discharge.  Recommend he follow-up with his cardiologist for outpatient eval  Final Clinical Impression(s) / ED Diagnoses Final diagnoses:  Chest pain, unspecified type  Hypertension, unspecified type  Coronary artery disease involving native coronary artery of native heart without angina pectoris  Coronary artery disease involving native heart, unspecified vessel or lesion type, unspecified whether angina present    Rx / DC Orders ED Discharge Orders          Ordered    Ambulatory referral to Cardiology       Comments: If you have not heard from the Cardiology office within the next 72 hours please call [phone].   11/20/21 1443              Dorie Rank, MD 11/20/21 [phone]

## 2021-11-20 NOTE — Discharge Instructions (Signed)
Continue your current medications.  Contact your cardiologist to let them know about the episode you had this evening.  Return to the ED for recurrent symptoms.

## 2021-11-20 NOTE — ED Provider Triage Note (Signed)
Emergency Medicine Provider Triage Evaluation Note  Steven Kemp , a 56 y.o. male  was evaluated in triage.  Pt complains of chest pain.  Patient reports that chest pain woke him up this morning at 3 AM.  Patient states that he was able to go to bed at this time.  Patient states that upon awakening at 8 AM, chest pain was lingering.  Patient took nitroglycerin at this time.  Patient states he had some relief from the nitro however he still has lingering left arm numbness so he took another nitro at 12 noon.  Patient states the chest pain is centralized in location, does not radiate.  Patient denies shortness of breath, leg swelling.  Patient has history of myocardial infarction 4 years ago.  Review of Systems  Positive:  Negative:   Physical Exam  BP 131/85 (BP Location: Left Arm)   Pulse 70   Temp 98.4 F (36.9 C) (Oral)   Resp 18   Ht '5\' 8"'$  (1.727 m)   Wt 82.1 kg   SpO2 96%   BMI 27.52 kg/m  Gen:   Awake, no distress   Resp:  Normal effort  MSK:   Moves extremities without difficulty  Other:  Lung sounds clear.  No lower extremity swelling.  Medical Decision Making  Medically screening exam initiated at 1:53 PM.  Appropriate orders placed.  Jamey Harman was informed that the remainder of the evaluation will be completed by another provider, this initial triage assessment does not replace that evaluation, and the importance of remaining in the ED until their evaluation is complete.     Yechiel, Erny, PA-C 11/20/21 1354

## 2021-11-22 ENCOUNTER — Other Ambulatory Visit: Payer: Self-pay | Admitting: Interventional Cardiology

## 2021-12-05 ENCOUNTER — Other Ambulatory Visit: Payer: Self-pay | Admitting: Interventional Cardiology

## 2021-12-09 ENCOUNTER — Encounter: Payer: Self-pay | Admitting: *Deleted

## 2021-12-24 ENCOUNTER — Other Ambulatory Visit: Payer: Self-pay | Admitting: Interventional Cardiology

## 2021-12-28 ENCOUNTER — Other Ambulatory Visit: Payer: Self-pay | Admitting: Interventional Cardiology

## 2022-01-06 ENCOUNTER — Other Ambulatory Visit: Payer: Self-pay | Admitting: Interventional Cardiology

## 2022-01-12 ENCOUNTER — Other Ambulatory Visit: Payer: Self-pay | Admitting: Interventional Cardiology

## 2022-01-15 ENCOUNTER — Encounter: Payer: Self-pay | Admitting: Emergency Medicine

## 2022-01-15 ENCOUNTER — Ambulatory Visit
Admission: EM | Admit: 2022-01-15 | Discharge: 2022-01-15 | Disposition: A | Discharge status: Home / Self Care | Payer: BLUE CROSS/BLUE SHIELD | Attending: Emergency Medicine | Admitting: Emergency Medicine

## 2022-01-15 DIAGNOSIS — L02415 Cutaneous abscess of right lower limb: Secondary | ICD-10-CM

## 2022-01-15 MED ORDER — CEPHALEXIN 500 MG PO CAPS: 500.0000 mg | ORAL_CAPSULE | Freq: Two times a day (BID) | ORAL | 0 refills | Status: AC

## 2022-01-15 NOTE — ED Provider Notes (Signed)
Steven Kemp    CSN: [id number] Arrival date & time: 01/15/22  1823      History   Chief Complaint Chief Complaint  Patient presents with   Insect Bite    HPI Steven Kemp is a 56 y.o. male.   Patient presents with erythema, tenderness, puslike drainage and swelling to the left lower ankle 2 days ago after insect bite.  Bite was witnessed, endorses offending agent is an ant.  Has iced the area but has not taken any oral medications.  Ice has helped reduce swelling.  Denies fever or chills.  Has full range of motion of ankle.  Denies numbness or tingling.  Past Medical History:  Diagnosis Date   Borderline diabetes    CAD (coronary artery disease), native coronary artery    a. STEMI 10/22/2016 s/p DES to the first diagonal with residual moderate disease in the LAD, right PDA and distal LAD.   Hyperlipidemia    Hypertension     Patient Active Problem List   Diagnosis Date Noted   Coughing 07/17/2019   CAD (coronary artery disease) 12/28/2016   Acute MI, lateral wall ([place])    Borderline diabetes 09/08/2013   BPPV (benign paroxysmal positional vertigo) 02/10/2013   Hyperlipidemia LDL goal <70 10/09/2009   Essential hypertension 10/09/2009   EXTERNAL HEMORRHOIDS WITHOUT MENTION COMP 07/24/2009   ALLERGIC  RHINITIS 02/03/2007    Past Surgical History:  Procedure Laterality Date   CORONARY STENT INTERVENTION N/A 10/22/2016   Procedure: Coronary Stent Intervention;  Surgeon: Jettie Booze, MD;  Location: [place] [facility];  Service: Cardiovascular;  Laterality: N/A;   LEFT HEART CATH AND CORONARY ANGIOGRAPHY N/A 10/22/2016   Procedure: Left Heart Cath and Coronary Angiography;  Surgeon: Jettie Booze, MD;  Location: [place] [facility];  Service: Cardiovascular;  Laterality: N/A;       Home Medications    Prior to Admission medications   Medication Sig Start Date End Date Taking? Authorizing Provider  atorvastatin (LIPITOR) 80 MG tablet Take 1  tablet (80 mg total) by mouth daily. 01/06/22  Yes Jettie Booze, MD  BRILINTA 90 MG TABS tablet TAKE 1 TAB BY MOUTH TWICE A DAY. 09/23/21  Yes Jettie Booze, MD  cephALEXin (KEFLEX) 500 MG capsule Take 1 capsule (500 mg total) by mouth 2 (two) times daily for 5 days. 01/15/22 01/20/22 Yes Firmin Belisle R, NP  lisinopril (ZESTRIL) 20 MG tablet TAKE 1 TABLET BY MOUTH EVERY DAY 12/29/21  Yes Jettie Booze, MD  metoprolol tartrate (LOPRESSOR) 25 MG tablet TAKE 1/2 TABLET BY MOUTH 2 TIMES DAILY 12/24/21  Yes Jettie Booze, MD  nitroGLYCERIN (NITROSTAT) 0.4 MG SL tablet PLACE 1 TABLET UNDER THE TONGUE EVERY 5 MINUTES AS NEEDED. 11/24/21   Jettie Booze, MD  sildenafil (REVATIO) 20 MG tablet TAKE 2 TO 5 TABLETS (40-'100MG'$ ) BY MOUTH 30 MINUTES PRIOR TO INTERCOURSE AS DIRECTED 03/19/21   Copland, Frederico Hamman, MD  trimethoprim-polymyxin b (POLYTRIM) ophthalmic solution Place 1 drop into both eyes every 4 (four) hours. 07/02/21   CoplandFrederico Hamman, MD    Family History Family History  Problem Relation Age of Onset   Hypertension Father    Colon cancer Neg Hx    Esophageal cancer Neg Hx    Rectal cancer Neg Hx    Stomach cancer Neg Hx     Social History Social History   Tobacco Use   Smoking status: Never   Smokeless tobacco: Never  Vaping Use  Vaping Use: Never used  Substance Use Topics   Alcohol use: Yes    Comment: very rare   Drug use: No     Allergies   Patient has no known allergies.   Review of Systems Review of Systems  Constitutional: Negative.   Respiratory: Negative.    Cardiovascular: Negative.   Skin:  Positive for wound. Negative for color change, pallor and rash.  Neurological: Negative.      Physical Exam Triage Vital Signs ED Triage Vitals  Enc Vitals Group     BP 01/15/22 1828 (!) 138/94     Pulse Rate 01/15/22 1828 73     Resp 01/15/22 1828 16     Temp 01/15/22 1828 97.9 F (36.6 C)     Temp Source 01/15/22 1828 Oral     SpO2  01/15/22 1828 94 %     Weight --      Height --      Head Circumference --      Peak Flow --      Pain Score 01/15/22 1831 1     Pain Loc --      Pain Edu? --      Excl. in [place]? --    No data found.  Updated Vital Signs BP (!) 138/94 (BP Location: Right Arm)   Pulse 73   Temp 97.9 F (36.6 C) (Oral)   Resp 16   SpO2 94%   Visual Acuity Right Eye Distance:   Left Eye Distance:   Bilateral Distance:    Right Eye Near:   Left Eye Near:    Bilateral Near:     Physical Exam Constitutional:      Appearance: Normal appearance.  HENT:     Head: Normocephalic.  Eyes:     Extraocular Movements: Extraocular movements intact.  Pulmonary:     Effort: Pulmonary effort is normal.  Skin:    Comments: Mild to moderate swelling with erythema noted to the anterior of the left ankle with 2 puncture marks to the center of the the site, nontender, unable to expel drainage with mild palpation, range of motion intact, 2+ dorsalis pedis pulse, sensation intact  Neurological:     Mental Status: He is alert and oriented to person, place, and time. Mental status is at baseline.  Psychiatric:        Mood and Affect: Mood normal.        Behavior: Behavior normal.      UC Treatments / Results  Labs (all labs ordered are listed, but only abnormal results are displayed) Labs Reviewed - No data to display  EKG   Radiology No results found.  Procedures Procedures (including critical care time)  Medications Ordered in UC Medications - No data to display  Initial Impression / Assessment and Plan / UC Course  I have reviewed the triage vital signs and the nursing notes.  Pertinent labs & imaging results that were available during my care of the patient were reviewed by me and considered in my medical decision making (see chart for details).  Abscess of skin of right ankle  Site appears to be infect, discussed with patient, keflex course prescribed, recommended warm compresses to  help to facilitate drainage,  may use otc analgesics, topical antihistamines or hydrocortisone cream topically, given strict precautions for nonhealing nondraining site to follow-up with urgent care for reevaluation Final Clinical Impressions(s) / UC Diagnoses   Final diagnoses:  Abscess of skin of right ankle  Discharge Instructions      Today you are being treated for bacterial infections that most likely occurred due to the break in your skin from the bug bites  Take keflex every morning and every evening for the next 5 days  Over-the-counter Tylenol or ibuprofen as per management of discomfort  May use over-the-counter topical Benadryl or hydrocortisone cream topically for management of itching  May cleanse daily with normal hygiene using diluted soapy water, pat dry may cover if drainage is occurring with a Band-Aid  Hold warm-hot compresses to affected area at least 4 times a day, this helps to facilitate draining, the more the better  Please return for evaluation for increased swelling, increased tenderness or pain, non healing site, non draining site, you begin to have fever or chills     ED Prescriptions     Medication Sig Dispense Auth. Provider   cephALEXin (KEFLEX) 500 MG capsule Take 1 capsule (500 mg total) by mouth 2 (two) times daily for 5 days. 10 capsule Hans Eden, NP      PDMP not reviewed this encounter.   Hans Eden, [place] 01/15/22 [phone]

## 2022-01-15 NOTE — ED Triage Notes (Signed)
Patient c/o Insect bite that happened 2 days ago.   Patient endorses swelling and redness on RT ankle where bite occurred.   Patient endorses "pus" drainage.   Patient endorses ants caused bite.   Patient endorses pain.   Patient hasn't taken any medications for symptoms.

## 2022-01-15 NOTE — Discharge Instructions (Signed)
Today you are being treated for bacterial infections that most likely occurred due to the break in your skin from the bug bites  Take keflex every morning and every evening for the next 5 days  Over-the-counter Tylenol or ibuprofen as per management of discomfort  May use over-the-counter topical Benadryl or hydrocortisone cream topically for management of itching  May cleanse daily with normal hygiene using diluted soapy water, pat dry may cover if drainage is occurring with a Band-Aid  Hold warm-hot compresses to affected area at least 4 times a day, this helps to facilitate draining, the more the better  Please return for evaluation for increased swelling, increased tenderness or pain, non healing site, non draining site, you begin to have fever or chills

## 2022-01-18 ENCOUNTER — Other Ambulatory Visit: Payer: Self-pay | Admitting: Interventional Cardiology

## 2022-01-25 ENCOUNTER — Other Ambulatory Visit: Payer: Self-pay | Admitting: Interventional Cardiology

## 2022-01-27 ENCOUNTER — Other Ambulatory Visit: Payer: Self-pay | Admitting: Interventional Cardiology

## 2022-01-27 ENCOUNTER — Telehealth: Payer: Self-pay | Admitting: Interventional Cardiology

## 2022-01-27 MED ORDER — METOPROLOL TARTRATE 25 MG PO TABS: ORAL_TABLET | ORAL | 1 refills | Status: DC

## 2022-01-27 MED ORDER — TICAGRELOR 90 MG PO TABS: ORAL_TABLET | ORAL | 1 refills | Status: DC

## 2022-01-27 NOTE — Telephone Encounter (Signed)
*  STAT* If patient is at the pharmacy, call can be transferred to refill team.   1. Which medications need to be refilled? (please list name of each medication and dose if known) BRILINTA 90 MG TABS tablet; metoprolol tartrate (LOPRESSOR) 25 MG tablet  2. Which pharmacy/location (including street and city if local pharmacy) is medication to be sent to? [id number]- [place], [place] - [address]  3. Do they need a 30 day or 90 day supply? [address]

## 2022-01-27 NOTE — Telephone Encounter (Signed)
Pt's medication was sent to pt's pharmacy as requested. Confirmation received.  °

## 2022-02-03 ENCOUNTER — Other Ambulatory Visit: Payer: Self-pay | Admitting: Interventional Cardiology

## 2022-02-09 ENCOUNTER — Telehealth: Payer: Self-pay | Admitting: Interventional Cardiology

## 2022-02-09 DIAGNOSIS — R7303 Prediabetes: Secondary | ICD-10-CM

## 2022-02-09 DIAGNOSIS — I25118 Atherosclerotic heart disease of native coronary artery with other forms of angina pectoris: Secondary | ICD-10-CM

## 2022-02-09 DIAGNOSIS — E785 Hyperlipidemia, unspecified: Secondary | ICD-10-CM

## 2022-02-09 NOTE — Telephone Encounter (Signed)
If not checked in the last one year, can check CBC, CMet, lipids and A1C

## 2022-02-09 NOTE — Telephone Encounter (Signed)
Patient called wanting to get orders for labs to be drawn prior his appointment on 03/12/22.

## 2022-02-09 NOTE — Telephone Encounter (Signed)
Dr. Irish Lack, pt will be seeing you for his regular one year follow-up appt on 03/12/22.  Pt is inquiring if you would like for him to have labs done prior to this office visit, and if so, what lab orders would you like for him to have?  Last set of labs (lipids, LFTs, and A1C) completed by our office was back on 11/05/20.  A1C was stable and pt had well controlled lipids and normal LFTs at that time.   Please advise and send reply back to triage.  Thanks!

## 2022-02-10 NOTE — Telephone Encounter (Signed)
Left the pt a message to call the office back to endorse lab orders and arrange lab appt, prior to his appointment with Dr. Irish Lack on 9/21.

## 2022-02-13 NOTE — Telephone Encounter (Signed)
Pt called back in to discuss labs and to schedule. Transferred to Mardene Celeste, Therapist, sports.

## 2022-02-13 NOTE — Telephone Encounter (Signed)
I spoke with patient.  He will come in for fasting lab work on March 09, 2022.

## 2022-02-18 ENCOUNTER — Other Ambulatory Visit: Payer: Self-pay | Admitting: Interventional Cardiology

## 2022-03-02 ENCOUNTER — Other Ambulatory Visit: Payer: Self-pay | Admitting: Interventional Cardiology

## 2022-03-09 ENCOUNTER — Ambulatory Visit: Payer: BLUE CROSS/BLUE SHIELD | Attending: Interventional Cardiology

## 2022-03-09 DIAGNOSIS — E785 Hyperlipidemia, unspecified: Secondary | ICD-10-CM

## 2022-03-09 DIAGNOSIS — I25118 Atherosclerotic heart disease of native coronary artery with other forms of angina pectoris: Secondary | ICD-10-CM

## 2022-03-09 DIAGNOSIS — R7303 Prediabetes: Secondary | ICD-10-CM

## 2022-03-10 LAB — COMPREHENSIVE METABOLIC PANEL
ALT: 28 IU/L (ref 0–44)
AST: 25 IU/L (ref 0–40)
Albumin/Globulin Ratio: 1.8 (ref 1.2–2.2)
Albumin: 4.4 g/dL (ref 3.8–4.9)
Alkaline Phosphatase: 97 IU/L (ref 44–121)
BUN/Creatinine Ratio: 14 (ref 9–20)
BUN: 15 mg/dL (ref 6–24)
Bilirubin Total: 0.5 mg/dL (ref 0.0–1.2)
CO2: 27 mmol/L (ref 20–29)
Calcium: 9.1 mg/dL (ref 8.7–10.2)
Chloride: 100 mmol/L (ref 96–106)
Creatinine, Ser: 1.05 mg/dL (ref 0.76–1.27)
Globulin, Total: 2.5 g/dL (ref 1.5–4.5)
Glucose: 113 mg/dL — ABNORMAL HIGH (ref 70–99)
Potassium: 4.6 mmol/L (ref 3.5–5.2)
Sodium: 136 mmol/L (ref 134–144)
Total Protein: 6.9 g/dL (ref 6.0–8.5)
eGFR: 84 mL/min/{1.73_m2} (ref >59)

## 2022-03-10 LAB — LIPID PANEL
Chol/HDL Ratio: 3.3 ratio (ref 0.0–5.0)
Cholesterol, Total: 127 mg/dL (ref 100–199)
HDL: 38 mg/dL — ABNORMAL LOW (ref >39)
LDL Chol Calc (NIH): 74 mg/dL (ref 0–99)
Triglycerides: 71 mg/dL (ref 0–149)
VLDL Cholesterol Cal: 15 mg/dL (ref 5–40)

## 2022-03-10 LAB — CBC
Hematocrit: 50.9 % (ref 37.5–51.0)
Hemoglobin: 16.9 g/dL (ref 13.0–17.7)
MCH: 29.6 pg (ref 26.6–33.0)
MCHC: 33.2 g/dL (ref 31.5–35.7)
MCV: 89 fL (ref 79–97)
Platelets: 191 10*3/uL (ref 150–450)
RBC: 5.71 x10E6/uL (ref 4.14–5.80)
RDW: 12.5 % (ref 11.6–15.4)
WBC: 6.8 10*3/uL (ref 3.4–10.8)

## 2022-03-10 LAB — HEMOGLOBIN A1C
Est. average glucose Bld gHb Est-mCnc: 137 mg/dL
Hgb A1c MFr Bld: 6.4 % — ABNORMAL HIGH (ref 4.8–5.6)

## 2022-03-10 NOTE — Progress Notes (Signed)
Cardiology Office Note   Date:  03/12/2022   ID:  Steven Kemp, DOB Apr 17, 1966, MRN [id number]  PCP:  [name], MD    No chief complaint on file.  CAD  Wt Readings from Last 3 Encounters:  03/12/22 181 lb (82.1 kg)  11/20/21 181 lb (82.1 kg)  03/19/21 177 lb (80.3 kg)       History of Present Illness: Steven Kemp is a 56 y.o. male  with h/o HTN, HLD and borderline DM, who presented to [facility] on 10/22/16 with CC of substernal chest and left arm pain with diaphoresis. He was brought in by EMS. On EMS arrival, EKG showed acute changes in the inferior lateral leads. Code STEMI was called. He was given 324 mg of ASA and 1 SL nitro with resolution of pain. He was brought to the cath lab emergently for cardiac catheterization.    Emergent LHC was performed and he was found to have an occluded 1st diagonal vessel. S/p DES x1 to 1st Diag with residual moderate disease in the LAD, right PDA and distal LAD. LV gram showed preserved LV function. He was continued on IV tirofiban for 2 hours post cath. Plan is for DAPT with ASA/Brilinta for at least one year. His statin was increased to Lipitor 80mg  and he started on metoprolol 12.5mg  BID. His home lisinopril was restarted prior to discharge.       Cath in 5/18 showed: Ost RPDA to RPDA lesion, 80 %stenosed. Bifurcation of PDA /PLA occurs more proximally. Prox Cx lesion, 50 %stenosed. Prox LAD to Mid LAD lesion, 25 %stenosed. Dist LAD lesion, 90 %stenosed. This is at the apex and not amenable to PCI. 1st Diag lesion, 95 %stenosed. A STENT PROMUS PREM MR 2.25X16 drug eluting stent was successfully placed, postdilated to > 2.5 mm. Post intervention, there is a 0% residual stenosis. The left ventricular systolic function is normal. LV end diastolic pressure is normal. The left ventricular ejection fraction is 50-55% by visual estimate. There is no aortic valve stenosis.   In 07-13-17, he was planning a vasectomy.   He was  changed to clopidogrel monotherapy due to a fair amount of diffuse, small vessel CAD.  Stress test 5/22: Good exercise capacity, achieved 9.3 METS Normal BP response to exercise Upsloping ST segment depression was noted during stress. No evidence of ischemia The left ventricular ejection fraction is normal (55-65%). Nuclear stress EF: 59%. The study is normal. This is a low risk study.  Older Brother died from flu in 07/13/21.    He has made two trips to the ER for chest pain.  They were not like prior angina.  He took some aspirin with relief.  Negative w/u in the ER.     Past Medical History:  Diagnosis Date   Borderline diabetes    CAD (coronary artery disease), native coronary artery    a. STEMI 10/22/2016 s/p DES to the first diagonal with residual moderate disease in the LAD, right PDA and distal LAD.   Hyperlipidemia    Hypertension     Past Surgical History:  Procedure Laterality Date   CORONARY STENT INTERVENTION N/A 10/22/2016   Procedure: Coronary Stent Intervention;  Surgeon: Steven Crafts, MD;  Location: [facility] INVASIVE CV LAB;  Service: Cardiovascular;  Laterality: N/A;   LEFT HEART CATH AND CORONARY ANGIOGRAPHY N/A 10/22/2016   Procedure: Left Heart Cath and Coronary Angiography;  Surgeon: Steven Crafts, MD;  Location: [facility] INVASIVE CV LAB;  Service: Cardiovascular;  Laterality:  N/A;     Current Outpatient Medications  Medication Sig Dispense Refill   atorvastatin (LIPITOR) 80 MG tablet TAKE 1 TABLET BY MOUTH EVERY DAY 30 tablet 0   lisinopril (ZESTRIL) 20 MG tablet TAKE 1 TABLET BY MOUTH EVERY DAY 30 tablet 0   metoprolol tartrate (LOPRESSOR) 25 MG tablet TAKE 1/2 TABLET TWICE A DAY BY MOUTH 30 tablet 1   nitroGLYCERIN (NITROSTAT) 0.4 MG SL tablet PLACE 1 TABLET UNDER THE TONGUE EVERY 5 MINUTES AS NEEDED. 25 tablet 2   sildenafil (REVATIO) 20 MG tablet TAKE 2 TO 5 TABLETS (40-100MG ) BY MOUTH 30 MINUTES PRIOR TO INTERCOURSE AS DIRECTED 100 tablet 5   ticagrelor  (BRILINTA) 90 MG TABS tablet TAKE 1 TAB BY MOUTH TWICE A DAY. 60 tablet 1   trimethoprim-polymyxin b (POLYTRIM) ophthalmic solution Place 1 drop into both eyes every 4 (four) hours. 10 mL 0   No current facility-administered medications for this visit.    Allergies:   Patient has no known allergies.    Social History:  The patient  reports that he has never smoked. He has never used smokeless tobacco. He reports current alcohol use. He reports that he does not use drugs.   Family History:  The patient's family history includes Hypertension in his father.    ROS:  Please see the history of present illness.   Otherwise, review of systems are positive for dizziness, sometimes with positional changes..   All other systems are reviewed and negative.    PHYSICAL EXAM: VS:  BP 122/72   Pulse 72   Ht 5\' 7"  (1.702 m)   Wt 181 lb (82.1 kg)   SpO2 98%   BMI 28.35 kg/m  , BMI Body mass index is 28.35 kg/m. GEN: Well nourished, well developed, in no acute distress HEENT: normal Neck: no JVD, carotid bruits, or masses Cardiac: RRR; no murmurs, rubs, or gallops,no edema  Respiratory:  clear to auscultation bilaterally, normal work of breathing GI: soft, nontender, nondistended, + BS MS: no deformity or atrophy Skin: warm and dry, no rash Neuro:  Strength and sensation are intact Psych: euthymic mood, full affect   EKG:   The ekg ordered in ER in June 2023 demonstrates normal sinus rhythm   Recent Labs: 03/09/2022: ALT 28; BUN 15; Creatinine, Ser 1.05; Hemoglobin 16.9; Platelets 191; Potassium 4.6; Sodium 136   Lipid Panel    Component Value Date/Time   CHOL 127 03/09/2022 0714   TRIG 71 03/09/2022 0714   HDL 38 (L) 03/09/2022 0714   CHOLHDL 3.3 03/09/2022 0714   CHOLHDL 3 01/19/2019 0841   VLDL 11.6 01/19/2019 0841   LDLCALC 74 03/09/2022 0714   LDLDIRECT 103.0 06/18/2015 1603     Other studies Reviewed: Additional studies/ records that were reviewed today with results  demonstrating: recent labs results reviewed with patient   ASSESSMENT AND PLAN:  CAD/Old MI: No angina on medical therapy.  No bleeding problems Brilinta monotherapy, will continue.  We talked about de-escalating to Plavix.  Since things are going well, he prefers to stay on the Brilinta.  No bleeding issues.  If he does have nuisance bleeding, would change to clopidogrel 75 mg daily. Hyperlipidemia: LDL 74; Continue atorvastatin.  ,  Whole food plant-based diet.  High-fiber diet.  Avoid Processed food. HTN: The current medical regimen is effective;  continue present plan and medications. Borderline DM: A1C 6.4.  Increase activity to the target below.  Ultrasound of carotids to evaluate some intermittent dizziness.  Sometimes  related to position.  With premature atherosclerosis, want to rule out significant carotid disease.   Current medicines are reviewed at length with the patient today.  The patient concerns regarding his medicines were addressed.  The following changes have been made:  No change  Labs/ tests ordered today include:  No orders of the defined types were placed in this encounter.   Recommend 150 minutes/week of aerobic exercise Low fat, low carb, high fiber diet recommended  Disposition:   FU in 1 year   Signed, Lance Muss, MD  03/12/2022 8:40 AM    [facility] [address] [place], [place], [place]  [address] Phone: [phone]; Fax: [phone]

## 2022-03-12 ENCOUNTER — Ambulatory Visit: Payer: BC Managed Care – PPO | Attending: Interventional Cardiology | Admitting: Interventional Cardiology

## 2022-03-12 ENCOUNTER — Encounter: Payer: Self-pay | Admitting: Interventional Cardiology

## 2022-03-12 VITALS — BP 122/72 | HR 72 | Ht 67.0 in | Wt 181.0 lb

## 2022-03-12 DIAGNOSIS — E785 Hyperlipidemia, unspecified: Secondary | ICD-10-CM

## 2022-03-12 DIAGNOSIS — R42 Dizziness and giddiness: Secondary | ICD-10-CM

## 2022-03-12 DIAGNOSIS — I10 Essential (primary) hypertension: Secondary | ICD-10-CM | POA: Diagnosis not present

## 2022-03-12 DIAGNOSIS — R7303 Prediabetes: Secondary | ICD-10-CM | POA: Diagnosis not present

## 2022-03-12 DIAGNOSIS — I25118 Atherosclerotic heart disease of native coronary artery with other forms of angina pectoris: Secondary | ICD-10-CM

## 2022-03-12 NOTE — Patient Instructions (Signed)
Medication Instructions:  Your physician recommends that you continue on your current medications as directed. Please refer to the Current Medication list given to you today.  *If you need a refill on your cardiac medications before your next appointment, please call your pharmacy*   Testing/Procedures: Your physician has requested that you have a carotid duplex. This test is an ultrasound of the carotid arteries in your neck. It looks at blood flow through these arteries that supply the brain with blood. Allow one hour for this exam. There are no restrictions or special instructions.    Follow-Up: At [facility], you and your health needs are our priority.  As part of our continuing mission to provide you with exceptional heart care, we have created designated Provider Care Teams.  These Care Teams include your primary Cardiologist (physician) and Advanced Practice Providers (APPs -  Physician Assistants and Nurse Practitioners) who all work together to provide you with the care you need, when you need it.    Your next appointment:   1 year(s)  The format for your next appointment:   In Person  Provider:   Larae Grooms, MD     Other Instructions

## 2022-03-20 ENCOUNTER — Other Ambulatory Visit: Payer: Self-pay | Admitting: Interventional Cardiology

## 2022-03-25 ENCOUNTER — Ambulatory Visit (HOSPITAL_COMMUNITY)
Admission: RE | Admit: 2022-03-25 | Discharge: 2022-03-25 | Disposition: A | Discharge status: Home / Self Care | Payer: BC Managed Care – PPO | Source: Ambulatory Visit | Attending: Interventional Cardiology | Admitting: Interventional Cardiology

## 2022-03-25 DIAGNOSIS — R42 Dizziness and giddiness: Secondary | ICD-10-CM | POA: Insufficient documentation

## 2022-03-28 ENCOUNTER — Other Ambulatory Visit: Payer: Self-pay | Admitting: Interventional Cardiology

## 2022-03-31 ENCOUNTER — Other Ambulatory Visit: Payer: Self-pay | Admitting: Interventional Cardiology

## 2022-07-10 IMAGING — CR DG CHEST 1V
1 series · 1 of 1 positions shown · non-contrast
Comparison: 10/28/2020

CLINICAL DATA: Chest pain

EXAM:
CHEST  1 VIEW

[chest pa]
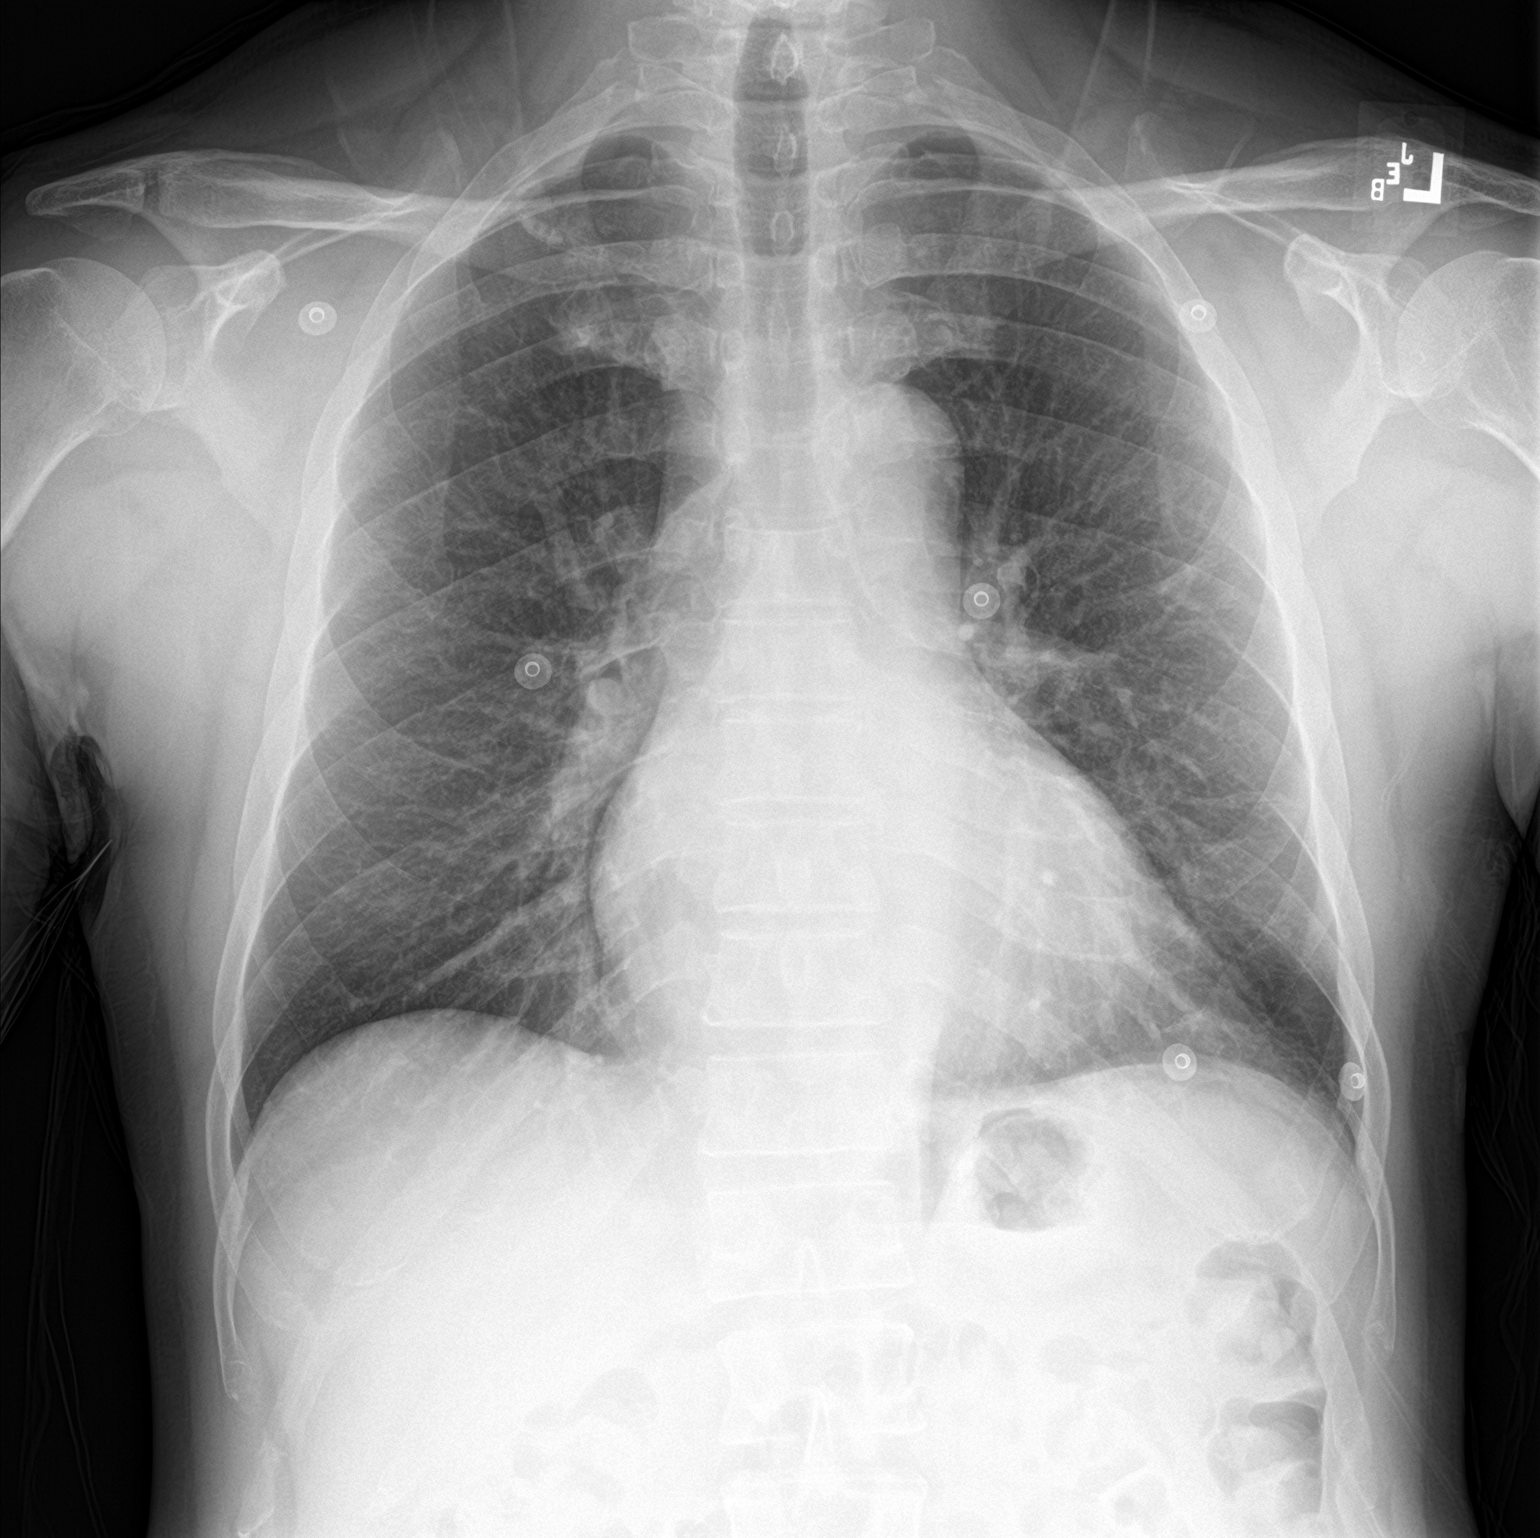

[1 of 1 positions shown; findings below may reference images not displayed]

FINDINGS: Heart size upper limits of normal. Mediastinal shadows are normal.
The lungs are clear. The vascularity is normal. No effusions. No
significant bone finding.
IMPRESSION: No active disease.

## 2022-07-15 ENCOUNTER — Ambulatory Visit: Payer: BC Managed Care – PPO | Admitting: Family Medicine

## 2022-07-15 ENCOUNTER — Encounter: Payer: Self-pay | Admitting: Family Medicine

## 2022-07-15 VITALS — BP 130/70 | HR 79 | Temp 98.3°F | Ht 67.0 in | Wt 183.5 lb

## 2022-07-15 DIAGNOSIS — J069 Acute upper respiratory infection, unspecified: Secondary | ICD-10-CM | POA: Diagnosis not present

## 2022-07-15 DIAGNOSIS — R0981 Nasal congestion: Secondary | ICD-10-CM

## 2022-07-15 DIAGNOSIS — N5201 Erectile dysfunction due to arterial insufficiency: Secondary | ICD-10-CM

## 2022-07-15 LAB — POC COVID19 BINAXNOW: SARS Coronavirus 2 Ag: NEGATIVE

## 2022-07-15 MED ORDER — SILDENAFIL CITRATE 20 MG PO TABS: ORAL_TABLET | ORAL | 5 refills | Status: DC

## 2022-07-15 NOTE — Progress Notes (Signed)
[place] T. Cosme Jacob, MD, [place] at [facility] [place], [address]  Phone: [phone]  FAX: [phone]  Steven Kemp - 57 y.o. male  MRN [id number]  Date of Birth: [birth year]  Date: 07/15/2022  PCP: Owens Loffler, MD  Referral: Owens Loffler, MD  Chief Complaint  Patient presents with   Cough    Symptoms started on Sunday night No Covid Test   Wheezing   Nasal Congestion   Subjective:   Steven Kemp presents with runny nose, sneezing, cough, sore throat, malaise, myalgias, arthralgia, chills, and fever.  The patient presents with coughing and wheezing. Congested a lot.  Waking up coughing and was wheezing in the middle of the night.   No fever..  Started to feel some pain in his lower back.  Really congested and clogged.   + recent exposure to others with similar symptoms.   The patent denies sore throat as the primary complaint. Denies sthortness of breath, otalgia, facial pain, abdominal pain, changes in bowel or bladder.  Generally feels terrible  ROS as above, eating and drinking - tolerating PO. Urinating normally. No excessive vomitting or diarrhea. O/w as above.  Objective:   Blood pressure 130/70, pulse 79, temperature 98.3 F (36.8 C), temperature source Oral, height '5\' 7"'$  (1.702 m), weight 183 lb 8 oz (83.2 kg), SpO2 96 %.   Gen: WDWN, cooperative. Globally Non-toxic HEENT: Normocephalic and atraumatic. Throat clear, w/o exudate, R TM clear, L TM - good landmarks, No fluid present. rhinnorhea. No frontal or maxillary sinus T. MMM NECK: Anterior cervical  LAD is present CV: RRR, No M/G/R, cap refill <2 sec PULM: Breathing comfortably in no respiratory distress. no wheezing, crackles, rhonchi ABD: S,NT,ND,+BS. No HSM. No rebound. MSK: Nml gait  Results for orders placed or performed in visit on 07/15/22  POC COVID-19  Result Value Ref Range   SARS Coronavirus 2 Ag  Negative Negative    Assessment and Plan:     ICD-10-CM   1. Viral URI  J06.9     2. Nasal congestion  R09.81 POC COVID-19    3. Erectile dysfunction due to arterial insufficiency  N52.01        Supportive care, fluids, cough medicines as needed, and anti-pyretics. Infection control emphasized, including OOW or school until AF 24 hours.  Follow-up: No follow-ups on file.  Meds ordered this encounter  Medications   sildenafil (REVATIO) 20 MG tablet    Sig: TAKE 2 TO 5 TABLETS (40-'100MG'$ ) BY MOUTH 30 MINUTES PRIOR TO INTERCOURSE AS DIRECTED    Dispense:  100 tablet    Refill:  5   Medications Discontinued During This Encounter  Medication Reason   trimethoprim-polymyxin b (POLYTRIM) ophthalmic solution Completed Course   sildenafil (REVATIO) 20 MG tablet Reorder   Orders Placed This Encounter  Procedures   POC COVID-19    Signed,  Senta Kantor T. Layn Kye, MD   Outpatient Encounter Medications as of 07/15/2022  Medication Sig   atorvastatin (LIPITOR) 80 MG tablet TAKE 1 TABLET BY MOUTH EVERY DAY   BRILINTA 90 MG TABS tablet TAKE 1 TABLET BY MOUTH TWICE A DAY   lisinopril (ZESTRIL) 20 MG tablet TAKE 1 TABLET BY MOUTH EVERY DAY   metoprolol tartrate (LOPRESSOR) 25 MG tablet TAKE 1/2 TABLET BY MOUTH TWICE A DAY   nitroGLYCERIN (NITROSTAT) 0.4 MG SL tablet PLACE 1 TABLET UNDER THE TONGUE EVERY 5 MINUTES AS NEEDED.   sildenafil (REVATIO) 20 MG tablet  TAKE 2 TO 5 TABLETS (40-'100MG'$ ) BY MOUTH 30 MINUTES PRIOR TO INTERCOURSE AS DIRECTED   [DISCONTINUED] sildenafil (REVATIO) 20 MG tablet TAKE 2 TO 5 TABLETS (40-'100MG'$ ) BY MOUTH 30 MINUTES PRIOR TO INTERCOURSE AS DIRECTED   [DISCONTINUED] trimethoprim-polymyxin b (POLYTRIM) ophthalmic solution Place 1 drop into both eyes every 4 (four) hours.   No facility-administered encounter medications on file as of 07/15/2022.

## 2022-07-23 ENCOUNTER — Ambulatory Visit
Admission: EM | Admit: 2022-07-23 | Discharge: 2022-07-23 | Disposition: A | Discharge status: Home / Self Care | Payer: BC Managed Care – PPO | Attending: Emergency Medicine | Admitting: Emergency Medicine

## 2022-07-23 ENCOUNTER — Ambulatory Visit (INDEPENDENT_AMBULATORY_CARE_PROVIDER_SITE_OTHER): Payer: BC Managed Care – PPO

## 2022-07-23 ENCOUNTER — Other Ambulatory Visit: Payer: BC Managed Care – PPO

## 2022-07-23 DIAGNOSIS — J0100 Acute maxillary sinusitis, unspecified: Secondary | ICD-10-CM | POA: Diagnosis not present

## 2022-07-23 DIAGNOSIS — J209 Acute bronchitis, unspecified: Secondary | ICD-10-CM

## 2022-07-23 DIAGNOSIS — R058 Other specified cough: Secondary | ICD-10-CM

## 2022-07-23 MED ORDER — AMOXICILLIN 875 MG PO TABS: 875.0000 mg | ORAL_TABLET | Freq: Two times a day (BID) | ORAL | 0 refills | Status: AC

## 2022-07-23 NOTE — Discharge Instructions (Addendum)
Take the amoxicillin as directed.  Follow up with your primary care provider.

## 2022-07-23 NOTE — ED Triage Notes (Signed)
Patient to Urgent Care with complaints of nasal congestion/ drainage, cough, and wheezing. Loss of sense of taste. Generalized body aches. Reports some diarrhea.   Cough worse at night, discolored mucus production. Denies any known fevers.   Symptoms started last week but worsened three days ago.  Taking mucinex.

## 2022-07-23 NOTE — ED Provider Notes (Signed)
Steven Kemp    CSN: [id number] Arrival date & time: 07/23/22  0805      History   Chief Complaint Chief Complaint  Patient presents with   Nasal Congestion   Headache   Wheezing    HPI Steven Kemp is a 57 y.o. male.   Patient presents with body aches, loss of taste, nasal congestion, runny nose, cough productive of brownish sputum, wheezing, diarrhea x 6 days.  No fever, sore throat, shortness of breath, chest pain, vomiting, rash, or other symptoms.  Treating symptoms with Mucinex.  Patient was seen by his PCP on 07/15/2022; diagnosed with viral URI; symptomatic treatment.  His medical history includes hypertension, MI, CAD, hyperlipidemia, borderline diabetes, vertigo.    The history is provided by the patient and medical records.    Past Medical History:  Diagnosis Date   Borderline diabetes    CAD (coronary artery disease), native coronary artery    a. STEMI 10/22/2016 s/p DES to the first diagonal with residual moderate disease in the LAD, right PDA and distal LAD.   Hyperlipidemia    Hypertension     Patient Active Problem List   Diagnosis Date Noted   CAD (coronary artery disease) 12/28/2016   Acute MI, lateral wall ([place])    Borderline diabetes 09/08/2013   BPPV (benign paroxysmal positional vertigo) 02/10/2013   Hyperlipidemia LDL goal <70 10/09/2009   Essential hypertension 10/09/2009   EXTERNAL HEMORRHOIDS WITHOUT MENTION COMP 07/24/2009   ALLERGIC  RHINITIS 02/03/2007    Past Surgical History:  Procedure Laterality Date   CORONARY STENT INTERVENTION N/A 10/22/2016   Procedure: Coronary Stent Intervention;  Surgeon: Jettie Booze, MD;  Location: [place] CV LAB;  Service: Cardiovascular;  Laterality: N/A;   LEFT HEART CATH AND CORONARY ANGIOGRAPHY N/A 10/22/2016   Procedure: Left Heart Cath and Coronary Angiography;  Surgeon: Jettie Booze, MD;  Location: [place] CV LAB;  Service: Cardiovascular;  Laterality: N/A;        Home Medications    Prior to Admission medications   Medication Sig Start Date End Date Taking? Authorizing Provider  amoxicillin (AMOXIL) 875 MG tablet Take 1 tablet (875 mg total) by mouth 2 (two) times daily for 10 days. 07/23/22 08/02/22 Yes Sharion Balloon, NP  atorvastatin (LIPITOR) 80 MG tablet TAKE 1 TABLET BY MOUTH EVERY DAY 03/30/22   Jettie Booze, MD  BRILINTA 90 MG TABS tablet TAKE 1 TABLET BY MOUTH TWICE A DAY 04/01/22   Jettie Booze, MD  lisinopril (ZESTRIL) 20 MG tablet TAKE 1 TABLET BY MOUTH EVERY DAY 03/30/22   Jettie Booze, MD  metoprolol tartrate (LOPRESSOR) 25 MG tablet TAKE 1/2 TABLET BY MOUTH TWICE A DAY 03/20/22   Jettie Booze, MD  nitroGLYCERIN (NITROSTAT) 0.4 MG SL tablet PLACE 1 TABLET UNDER THE TONGUE EVERY 5 MINUTES AS NEEDED. 11/24/21   Jettie Booze, MD  sildenafil (REVATIO) 20 MG tablet TAKE 2 TO 5 TABLETS (40-'100MG'$ ) BY MOUTH 30 MINUTES PRIOR TO INTERCOURSE AS DIRECTED 07/15/22   [place], Frederico Hamman, MD    Family History Family History  Problem Relation Age of Onset   Hypertension Father    Colon cancer Neg Hx    Esophageal cancer Neg Hx    Rectal cancer Neg Hx    Stomach cancer Neg Hx     Social History Social History   Tobacco Use   Smoking status: Never   Smokeless tobacco: Never  Vaping Use   Vaping Use: Never  used  Substance Use Topics   Alcohol use: Yes    Comment: very rare   Drug use: No     Allergies   Patient has no known allergies.   Review of Systems Review of Systems  Constitutional:  Negative for chills and fever.  HENT:  Positive for congestion and rhinorrhea. Negative for ear pain and sore throat.   Respiratory:  Positive for cough and wheezing. Negative for shortness of breath.   Cardiovascular:  Negative for chest pain and palpitations.  Gastrointestinal:  Positive for diarrhea. Negative for abdominal pain and vomiting.  Skin:  Negative for rash.  All other systems reviewed and are  negative.    Physical Exam Triage Vital Signs ED Triage Vitals  Enc Vitals Group     BP      Pulse      Resp      Temp      Temp src      SpO2      Weight      Height      Head Circumference      Peak Flow      Pain Score      Pain Loc      Pain Edu?      Excl. in [place]?    No data found.  Updated Vital Signs BP 125/84   Pulse 73   Temp 98.1 F (36.7 C)   Resp 18   SpO2 96%   Visual Acuity Right Eye Distance:   Left Eye Distance:   Bilateral Distance:    Right Eye Near:   Left Eye Near:    Bilateral Near:     Physical Exam Vitals and nursing note reviewed.  Constitutional:      General: He is not in acute distress.    Appearance: Normal appearance. He is well-developed. He is not ill-appearing.  HENT:     Right Ear: Tympanic membrane normal.     Left Ear: Tympanic membrane normal.     Nose: Congestion and rhinorrhea present.     Mouth/Throat:     Mouth: Mucous membranes are moist.     Pharynx: Oropharynx is clear.  Cardiovascular:     Rate and Rhythm: Normal rate and regular rhythm.     Heart sounds: Normal heart sounds.  Pulmonary:     Effort: Pulmonary effort is normal. No respiratory distress.     Breath sounds: Wheezing and rhonchi present.  Abdominal:     General: Bowel sounds are normal.     Palpations: Abdomen is soft.     Tenderness: There is no abdominal tenderness. There is no guarding or rebound.  Musculoskeletal:     Cervical back: Neck supple.  Skin:    General: Skin is warm and dry.  Neurological:     Mental Status: He is alert.  Psychiatric:        Mood and Affect: Mood normal.        Behavior: Behavior normal.      UC Treatments / Results  Labs (all labs ordered are listed, but only abnormal results are displayed) Labs Reviewed - No data to display  EKG   Radiology DG Chest 2 View  Result Date: 07/23/2022 CLINICAL DATA:  Productive cough EXAM: CHEST - 2 VIEW COMPARISON:  CXR 11/20/21 FINDINGS: No pleural effusion. No  pneumothorax. No focal airspace opacity. Normal cardiac and mediastinal contours. No radiographically apparent displaced rib fractures. Visualized upper abdomen is unremarkable. Vertebral body heights are maintained. IMPRESSION:  No focal airspace opacity. Electronically Signed   By: Marin Roberts M.D.   On: 07/23/2022 08:37    Procedures Procedures (including critical care time)  Medications Ordered in UC Medications - No data to display  Initial Impression / Assessment and Plan / UC Course  I have reviewed the triage vital signs and the nursing notes.  Pertinent labs & imaging results that were available during my care of the patient were reviewed by me and considered in my medical decision making (see chart for details).    Productive cough, Acute bronchitis, Acute sinusitis.  CXR negative.  Treating with amoxicillin.  Instructed patient to follow up with his PCP if his symptoms are not improving.  Education provided on sinus infection and bronchitis.  He agrees to plan of care.    Final Clinical Impressions(s) / UC Diagnoses   Final diagnoses:  Productive cough  Acute bronchitis, unspecified organism  Acute non-recurrent maxillary sinusitis     Discharge Instructions      Take the amoxicillin as directed.  Follow up with your primary care provider.        ED Prescriptions     Medication Sig Dispense Auth. Provider   amoxicillin (AMOXIL) 875 MG tablet Take 1 tablet (875 mg total) by mouth 2 (two) times daily for 10 days. 20 tablet Sharion Balloon, NP      PDMP not reviewed this encounter.   Sharion Balloon, NP 07/23/22 [phone]

## 2022-12-10 ENCOUNTER — Telehealth: Payer: Self-pay | Admitting: Interventional Cardiology

## 2022-12-10 MED ORDER — TICAGRELOR 90 MG PO TABS: 90.0000 mg | ORAL_TABLET | Freq: Two times a day (BID) | ORAL | 0 refills | Status: DC

## 2022-12-10 NOTE — Telephone Encounter (Signed)
*  STAT* If patient is at the pharmacy, call can be transferred to refill team.   1. Which medications need to be refilled? (please list name of each medication and dose if known)   BRILINTA 90 MG TABS tablet   2. Which pharmacy/location (including street and city if local pharmacy) is medication to be sent to?  [id number] - [place], [place] - [address]   3. Do they need a 30 day or 90 day supply?   90 day  Caller stated patient is out of this medication.

## 2022-12-10 NOTE — Telephone Encounter (Signed)
Pt's medication was sent to pt's pharmacy as requested. Confirmation received.  °

## 2022-12-26 ENCOUNTER — Other Ambulatory Visit: Payer: Self-pay | Admitting: Interventional Cardiology

## 2023-03-27 ENCOUNTER — Other Ambulatory Visit: Payer: Self-pay | Admitting: Interventional Cardiology

## 2023-03-31 ENCOUNTER — Other Ambulatory Visit: Payer: Self-pay | Admitting: Interventional Cardiology

## 2023-04-01 ENCOUNTER — Telehealth: Payer: Self-pay | Admitting: Interventional Cardiology

## 2023-04-01 NOTE — Telephone Encounter (Signed)
Ran in to this patient at the [facility].  He mentioned that he is only getting 30 day supply on meds.  OK to give him a 90 day supply on his cardiac meds. I informed him that he would need an annual appointment for refills.

## 2023-04-02 MED ORDER — ATORVASTATIN CALCIUM 80 MG PO TABS: 80.0000 mg | ORAL_TABLET | Freq: Every day | ORAL | 1 refills | Status: DC

## 2023-04-02 MED ORDER — LISINOPRIL 20 MG PO TABS: 20.0000 mg | ORAL_TABLET | Freq: Every day | ORAL | 1 refills | Status: DC

## 2023-04-02 MED ORDER — METOPROLOL TARTRATE 25 MG PO TABS: ORAL_TABLET | ORAL | 1 refills | Status: DC

## 2023-04-02 MED ORDER — TICAGRELOR 90 MG PO TABS: 90.0000 mg | ORAL_TABLET | Freq: Two times a day (BID) | ORAL | 1 refills | Status: DC

## 2023-04-02 MED ORDER — NITROGLYCERIN 0.4 MG SL SUBL: 0.4000 mg | SUBLINGUAL_TABLET | SUBLINGUAL | 2 refills | Status: DC | PRN

## 2023-04-02 NOTE — Telephone Encounter (Signed)
I spoke with patient and scheduled him to see Donnella Bi, PA on 06/24/23.  [facility]

## 2023-04-15 ENCOUNTER — Ambulatory Visit
Admission: EM | Admit: 2023-04-15 | Discharge: 2023-04-15 | Disposition: A | Discharge status: Home / Self Care | Payer: BC Managed Care – PPO | Attending: Emergency Medicine | Admitting: Emergency Medicine

## 2023-04-15 DIAGNOSIS — H6692 Otitis media, unspecified, left ear: Secondary | ICD-10-CM

## 2023-04-15 MED ORDER — AMOXICILLIN 875 MG PO TABS: 875.0000 mg | ORAL_TABLET | Freq: Two times a day (BID) | ORAL | 0 refills | Status: AC

## 2023-04-15 MED ORDER — AMOXICILLIN 875 MG PO TABS
875.0000 mg | ORAL_TABLET | Freq: Two times a day (BID) | ORAL | 0 refills | Status: DC
Start: 2023-04-15 — End: 2023-04-15

## 2023-04-15 MED ORDER — AMOXICILLIN 875 MG PO TABS: 875.0000 mg | ORAL_TABLET | Freq: Two times a day (BID) | ORAL | 0 refills | Status: DC

## 2023-04-15 NOTE — Discharge Instructions (Addendum)
Take the amoxicillin as directed.  Follow up with your primary care provider if your symptoms are not improving.   ° ° °

## 2023-04-15 NOTE — ED Provider Notes (Signed)
Steven Kemp    CSN: [id number] Arrival date & time: 04/15/23  1846      History   Chief Complaint Chief Complaint  Patient presents with   Otalgia   Ear Fullness    HPI Steven Kemp is a 57 y.o. male.  Patient presents with left ear discomfort, fullness, congestion today.  No fever, drainage, sore throat, cough, shortness of breath, or other symptoms.  No OTC medications today.  His medical history includes hypertension and borderline diabetes.  The history is provided by the patient and medical records.    Past Medical History:  Diagnosis Date   Borderline diabetes    CAD (coronary artery disease), native coronary artery    a. STEMI 10/22/2016 s/p DES to the first diagonal with residual moderate disease in the LAD, right PDA and distal LAD.   Hyperlipidemia    Hypertension     Patient Active Problem List   Diagnosis Date Noted   CAD (coronary artery disease) 12/28/2016   Acute MI, lateral wall (HCC)    Borderline diabetes 09/08/2013   BPPV (benign paroxysmal positional vertigo) 02/10/2013   Hyperlipidemia LDL goal <70 10/09/2009   Essential hypertension 10/09/2009   External hemorrhoids 07/24/2009   Allergic rhinitis 02/03/2007    Past Surgical History:  Procedure Laterality Date   CORONARY STENT INTERVENTION N/A 10/22/2016   Procedure: Coronary Stent Intervention;  Surgeon: Corky Crafts, MD;  Location: [facility] INVASIVE CV LAB;  Service: Cardiovascular;  Laterality: N/A;   LEFT HEART CATH AND CORONARY ANGIOGRAPHY N/A 10/22/2016   Procedure: Left Heart Cath and Coronary Angiography;  Surgeon: Corky Crafts, MD;  Location: [facility] INVASIVE CV LAB;  Service: Cardiovascular;  Laterality: N/A;       Home Medications    Prior to Admission medications   Medication Sig Start Date End Date Taking? Authorizing Provider  atorvastatin (LIPITOR) 80 MG tablet Take 1 tablet (80 mg total) by mouth daily. 04/02/23  Yes Turner, Cornelious Bryant, MD  lisinopril  (ZESTRIL) 20 MG tablet Take 1 tablet (20 mg total) by mouth daily. 04/02/23  Yes Turner, Cornelious Bryant, MD  metoprolol tartrate (LOPRESSOR) 25 MG tablet TAKE 1/2 TABLET TWICE A DAY BY MOUTH 04/02/23  Yes Turner, Cornelious Bryant, MD  ticagrelor (BRILINTA) 90 MG TABS tablet Take 1 tablet (90 mg total) by mouth 2 (two) times daily. 04/02/23  Yes Turner, Cornelious Bryant, MD  amoxicillin (AMOXIL) 875 MG tablet Take 1 tablet (875 mg total) by mouth 2 (two) times daily for 10 days. 04/15/23 04/25/23  Mickie Bail, NP  nitroGLYCERIN (NITROSTAT) 0.4 MG SL tablet Place 1 tablet (0.4 mg total) under the tongue every 5 (five) minutes as needed for chest pain. 04/02/23   Quintella Reichert, MD  sildenafil (REVATIO) 20 MG tablet TAKE 2 TO 5 TABLETS (40-100MG ) BY MOUTH 30 MINUTES PRIOR TO INTERCOURSE AS DIRECTED 07/15/22   Copland, Karleen Hampshire, MD    Family History Family History  Problem Relation Age of Onset   Hypertension Father    Colon cancer Neg Hx    Esophageal cancer Neg Hx    Rectal cancer Neg Hx    Stomach cancer Neg Hx     Social History Social History   Tobacco Use   Smoking status: Never   Smokeless tobacco: Never  Vaping Use   Vaping status: Never Used  Substance Use Topics   Alcohol use: Yes    Comment: very rare   Drug use: No     Allergies  Patient has no known allergies.   Review of Systems Review of Systems  Constitutional:  Negative for chills and fever.  HENT:  Positive for ear pain. Negative for sore throat.   Respiratory:  Negative for cough and shortness of breath.      Physical Exam Triage Vital Signs ED Triage Vitals  Encounter Vitals Group     BP 04/15/23 1912 137/88     Systolic BP Percentile --      Diastolic BP Percentile --      Pulse Rate 04/15/23 1904 63     Resp 04/15/23 1904 18     Temp 04/15/23 1904 98 F (36.7 C)     Temp src --      SpO2 04/15/23 1904 98 %     Weight --      Height --      Head Circumference --      Peak Flow --      Pain Score 04/15/23 1910 1      Pain Loc --      Pain Education --      Exclude from Growth Chart --    No data found.  Updated Vital Signs BP 137/88 (BP Location: Left Arm)   Pulse 63   Temp 98 F (36.7 C)   Resp 18   SpO2 98%   Visual Acuity Right Eye Distance:   Left Eye Distance:   Bilateral Distance:    Right Eye Near:   Left Eye Near:    Bilateral Near:     Physical Exam Constitutional:      General: He is not in acute distress. HENT:     Right Ear: Tympanic membrane and ear canal normal.     Left Ear: Ear canal normal. Tympanic membrane is erythematous.     Nose: Nose normal.     Mouth/Throat:     Mouth: Mucous membranes are moist.     Pharynx: Oropharynx is clear.  Cardiovascular:     Rate and Rhythm: Normal rate and regular rhythm.     Heart sounds: Normal heart sounds.  Pulmonary:     Effort: Pulmonary effort is normal. No respiratory distress.     Breath sounds: Normal breath sounds.  Skin:    General: Skin is warm and dry.  Neurological:     Mental Status: He is alert.      UC Treatments / Results  Labs (all labs ordered are listed, but only abnormal results are displayed) Labs Reviewed - No data to display  EKG   Radiology No results found.  Procedures Procedures (including critical care time)  Medications Ordered in UC Medications - No data to display  Initial Impression / Assessment and Plan / UC Course  I have reviewed the triage vital signs and the nursing notes.  Pertinent labs & imaging results that were available during my care of the patient were reviewed by me and considered in my medical decision making (see chart for details).    Left otitis media.  Afebrile and vital signs are stable.  Treating with amoxicillin.  Tylenol or ibuprofen as needed.  Instructed patient to follow-up with his PCP if he is not improving.  Education provided on otitis media.  Patient agrees to plan of care.    (Pharmacy changed due to hours of operation.  Pharmacy changed  again due to his insurance.)  Final Clinical Impressions(s) / UC Diagnoses   Final diagnoses:  Left otitis media, unspecified otitis media type  Discharge Instructions      Take the amoxicillin as directed.  Follow-up with your primary care provider if your symptoms are not improving.      ED Prescriptions     Medication Sig Dispense Auth. Provider   amoxicillin (AMOXIL) 875 MG tablet  (Status: Discontinued) Take 1 tablet (875 mg total) by mouth 2 (two) times daily for 10 days. 20 tablet Mickie Bail, NP   amoxicillin (AMOXIL) 875 MG tablet  (Status: Discontinued) Take 1 tablet (875 mg total) by mouth 2 (two) times daily for 10 days. 20 tablet Mickie Bail, NP   amoxicillin (AMOXIL) 875 MG tablet Take 1 tablet (875 mg total) by mouth 2 (two) times daily for 10 days. 20 tablet Mickie Bail, NP      PDMP not reviewed this encounter.   Mickie Bail, NP 04/15/23 1946

## 2023-04-15 NOTE — ED Triage Notes (Signed)
Pt states he is having left ear pain with fullness, congestion that started today.

## 2023-06-04 ENCOUNTER — Telehealth: Payer: Self-pay | Admitting: Family Medicine

## 2023-06-04 MED ORDER — ALPRAZOLAM 0.5 MG PO TABS: 0.5000 mg | ORAL_TABLET | Freq: Two times a day (BID) | ORAL | 0 refills | Status: DC | PRN

## 2023-06-04 NOTE — Telephone Encounter (Signed)
Patient called in returning call he received. Relayed message below to patient in regards to be meds been sent over.

## 2023-06-04 NOTE — Addendum Note (Signed)
Addended by: [name] on: 06/04/2023 11:34 AM   Modules accepted: Orders

## 2023-06-04 NOTE — Telephone Encounter (Signed)
Unable to reach patient. Left voicemail to return call to our office.   

## 2023-06-04 NOTE — Telephone Encounter (Signed)
OK.  I sent in Xanax 0.5 mg, #5 tablets

## 2023-06-04 NOTE — Telephone Encounter (Signed)
Patient called in and stated that he is leaving to go to the [place] tomorrow. He was wanting to know if Dr. Patsy Lager could send him in something for his nerves. He stated that he will taking about 8 flights.Please advise. Thank you!

## 2023-06-24 ENCOUNTER — Ambulatory Visit: Payer: BC Managed Care – PPO | Admitting: Cardiology

## 2023-06-27 NOTE — Progress Notes (Signed)
 Cardiology Office Note:  .   Date:  06/28/2023  ID:  Steven Kemp, DOB 11/17/1965, MRN [id number] PCP: Watt Mirza, MD  [facility] Providers Cardiologist:  Lurena MARLA Red, MD {  History of Present Illness: .   Steven Kemp is a 58 y.o. male with a past medical history of hypertension, hyperlipidemia, borderline DM who is here for follow-up appointment.  History includes presenting to [facility] on 10/22/2016 with complaint of substernal chest and left arm pain with diaphoresis.  Brought in by EMS and on arrival EKG showed acute changes in the inferior lateral leads.  Code STEMI was called.  He was given 324 mg of ASA and 1 sublingual nitro with resolution of pain.  Brought to the Cath Lab urgently for cardiac catheterization.  Cardiac catheterization showed occluded first diagonal vessel.  Status post DES x 1 to the first diagonal with residual moderate disease in the LAD, right PDA and distal LAD.  LV gram showed preserved LV function.  Was continued on IV tirofiban  for 2 hours post cath.  Plan was for DAPT with ASA/Brilinta  for at least 1 year.  Statin was increased to Lipitor  80 mg and started on metoprolol  12.5 mg twice daily.  Home lisinopril  was restarted prior to discharge.  In 2019, he was planned to have vasectomy.  Changed to clopidogrel monotherapy due to fair amount of diffuse small vessel CAD.  Stress test 10/2020 with good exercise capacity.  Normal study and low risk.  LVEF 55 to 65%.  He was last seen 03/12/2022 and at that time he had 2 trips to the ER for chest pain.  Fortunately, they were not like his prior angina.  Took some aspirin  with relief.  Negative workup in the ER.  Today, he presents with a history of heart attack and diabetes for a routine check-up. The patient reports no recent chest pain or shortness of breath, attributing previous episodes to work-related stress. 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 The patient has been on medication for heart disease for the past five years, following a heart attack in 2018. The patient also reports managing his diabetes through diet and intermittent fasting, and has noticed some weight loss. The patient has not had any recent lab work done, but is open to having it done if necessary. The patient also mentions a history of carotid artery disease, but has not had any recent symptoms.  Reports no shortness of breath nor dyspnea on exertion. Reports no chest pain, pressure, or tightness. No edema, orthopnea, PND. Reports no palpitations.   Discussed the use of AI scribe software for clinical note transcription with the patient, who gave verbal consent to proceed.  ROS: pertinent ROS in HPI  Studies Reviewed: SABRA        Cath in 5/18 showed: Ost RPDA to RPDA lesion, 80 %stenosed. Bifurcation of PDA /PLA occurs more proximally. Prox Cx lesion, 50 %stenosed. Prox LAD to Mid LAD lesion, 25 %stenosed. Dist LAD lesion, 90 %stenosed. This is at the apex and not amenable to PCI. 1st Diag lesion, 95 %stenosed. A STENT PROMUS PREM MR 2.25X16 drug eluting stent was successfully placed, postdilated to > 2.5 mm. Post intervention, there is a 0% residual stenosis. The left ventricular systolic function is normal. LV end diastolic pressure is normal. The left ventricular ejection fraction is 50-55% by visual estimate. There is no aortic valve stenosis.  Physical Exam:   VS:  BP 120/78   Pulse 68   Ht 5' 7 (1.702 m)   Wt 172 lb 6.4 oz (78.2 kg)   SpO2 97%   BMI 27.00 kg/m    Wt Readings from Last 3 Encounters:  06/28/23 172 lb 6.4 oz (78.2 kg)  07/15/22 183 lb 8 oz (83.2 kg)  03/12/22 181 lb (82.1 kg)    GEN: Well nourished, well developed in no acute distress NECK: No JVD; No carotid bruits CARDIAC: RRR, no murmurs, rubs, gallops RESPIRATORY:  Clear to auscultation without rales, wheezing or rhonchi  ABDOMEN: Soft,  non-tender, non-distended EXTREMITIES:  No edema; No deformity   ASSESSMENT AND PLAN: .    Hypertension -continue current medication regimen, well controlled today  Ischemic Heart Disease/old MI back in 2018 Stable since myocardial infarction in May 2018. No new chest pain or shortness of breath. EKG shows chronic changes, but no new abnormalities. Blood pressure and heart rate within normal limits. -Continue current medications. -Consider referral to a new cardiologist for continuity of care.  Diabetes Patient reports improved diet and weight loss. No recent lab work available to assess glycemic control. -Order recent labs including lipid panel and kidney function tests. 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 -Provide a note recommending limited hours at the front desk due to potential exacerbation of cardiac condition.  Carotid Artery Disease Minimal plaque noted on previous ultrasound. No new symptoms suggestive of carotid artery stenosis. -No need for repeat ultrasound unless new symptoms arise.      Dispo: he can follow-up in a year with Dr. Wendel  Signed, Steven LOISE Fabry, PA-C

## 2023-06-28 ENCOUNTER — Encounter: Payer: Self-pay | Admitting: Physician Assistant

## 2023-06-28 ENCOUNTER — Ambulatory Visit: Payer: BC Managed Care – PPO | Attending: Physician Assistant | Admitting: Physician Assistant

## 2023-06-28 VITALS — BP 120/78 | HR 68 | Ht 67.0 in | Wt 172.4 lb

## 2023-06-28 DIAGNOSIS — I779 Disorder of arteries and arterioles, unspecified: Secondary | ICD-10-CM

## 2023-06-28 DIAGNOSIS — I10 Essential (primary) hypertension: Secondary | ICD-10-CM

## 2023-06-28 DIAGNOSIS — E785 Hyperlipidemia, unspecified: Secondary | ICD-10-CM | POA: Diagnosis not present

## 2023-06-28 DIAGNOSIS — I2510 Atherosclerotic heart disease of native coronary artery without angina pectoris: Secondary | ICD-10-CM

## 2023-06-28 DIAGNOSIS — Z566 Other physical and mental strain related to work: Secondary | ICD-10-CM

## 2023-06-28 DIAGNOSIS — I25118 Atherosclerotic heart disease of native coronary artery with other forms of angina pectoris: Secondary | ICD-10-CM

## 2023-06-28 DIAGNOSIS — R7303 Prediabetes: Secondary | ICD-10-CM | POA: Diagnosis not present

## 2023-06-28 MED ORDER — TICAGRELOR 90 MG PO TABS: 90.0000 mg | ORAL_TABLET | Freq: Two times a day (BID) | ORAL | 3 refills | Status: DC

## 2023-06-28 NOTE — Patient Instructions (Signed)
 Medication Instructions:  No changes *If you need a refill on your cardiac medications before your next appointment, please call your pharmacy*  Lab Work: We'll need you to get fasting blood work done in at your earliest convenience. If you have labs (blood work) drawn today and your tests are completely normal, you will receive your results only by: MyChart Message (if you have MyChart) OR A paper copy in the mail If you have any lab test that is abnormal or we need to change your treatment, we will call you to review the results.  Testing/Procedures: No testing  Follow-Up: At [facility] you and your health needs are our priority.  As part of our continuing mission to provide you with exceptional heart care, we have created designated Provider Care Teams.  These Care Teams include your primary Cardiologist (physician) and Advanced Practice Providers (APPs -  Physician Assistants and Nurse Practitioners) who all work together to provide you with the care you need, when you need it.  We recommend signing up for the patient portal called MyChart.  Sign up information is provided on this After Visit Summary.  MyChart is used to connect with patients for Virtual Visits (Telemedicine).  Patients are able to view lab/test results, encounter notes, upcoming appointments, etc.  Non-urgent messages can be sent to your provider as well.   To learn more about what you can do with MyChart, go to [email or web address].    Your next appointment:   1 year(s)  Provider:   Arun K Thukkani, MD

## 2023-09-15 ENCOUNTER — Ambulatory Visit
Admission: EM | Admit: 2023-09-15 | Discharge: 2023-09-15 | Disposition: A | Discharge status: Home / Self Care | Attending: Emergency Medicine | Admitting: Emergency Medicine

## 2023-09-15 DIAGNOSIS — H1031 Unspecified acute conjunctivitis, right eye: Secondary | ICD-10-CM

## 2023-09-15 DIAGNOSIS — B070 Plantar wart: Secondary | ICD-10-CM

## 2023-09-15 MED ORDER — POLYMYXIN B-TRIMETHOPRIM 10000-0.1 UNIT/ML-% OP SOLN: 1.0000 [drp] | Freq: Four times a day (QID) | OPHTHALMIC | 0 refills | Status: AC

## 2023-09-15 NOTE — ED Provider Notes (Signed)
 Steven Kemp    CSN: [id number] Arrival date & time: 09/15/23  1759      History   Chief Complaint Chief Complaint  Patient presents with   Eye Drainage   Foot Pain    HPI Steven Kemp is a 58 y.o. male.  Patient presents with redness, itching, drainage from his right eye since this morning.  He has been treating this with OTC eyedrops.  No eye trauma, change in vision, eye pain, fever.  Patient also presents with a sore on the bottom of his left foot x 2 weeks.  He thinks this sore is a wart and has been treating it with Compound W.  No purulent drainage.    The history is provided by the patient and medical records.    Past Medical History:  Diagnosis Date   Borderline diabetes    CAD (coronary artery disease), native coronary artery    a. STEMI 10/22/2016 s/p DES to the first diagonal with residual moderate disease in the LAD, right PDA and distal LAD.   Hyperlipidemia    Hypertension     Patient Active Problem List   Diagnosis Date Noted   CAD (coronary artery disease) 12/28/2016   Acute MI, lateral wall (HCC)    Borderline diabetes 09/08/2013   BPPV (benign paroxysmal positional vertigo) 02/10/2013   Hyperlipidemia LDL goal <70 10/09/2009   Essential hypertension 10/09/2009   External hemorrhoids 07/24/2009   Allergic rhinitis 02/03/2007    Past Surgical History:  Procedure Laterality Date   CORONARY STENT INTERVENTION N/A 10/22/2016   Procedure: Coronary Stent Intervention;  Surgeon: Corky Crafts, MD;  Location: [facility] INVASIVE CV LAB;  Service: Cardiovascular;  Laterality: N/A;   LEFT HEART CATH AND CORONARY ANGIOGRAPHY N/A 10/22/2016   Procedure: Left Heart Cath and Coronary Angiography;  Surgeon: Corky Crafts, MD;  Location: [facility] INVASIVE CV LAB;  Service: Cardiovascular;  Laterality: N/A;       Home Medications    Prior to Admission medications   Medication Sig Start Date End Date Taking? Authorizing Provider   trimethoprim-polymyxin b (POLYTRIM) ophthalmic solution Place 1 drop into both eyes 4 (four) times daily for 7 days. 09/15/23 09/22/23 Yes Mickie Bail, NP  ALPRAZolam Prudy Feeler) 0.5 MG tablet Take 1 tablet (0.5 mg total) by mouth 2 (two) times daily as needed for anxiety. 06/04/23   Copland, Karleen Hampshire, MD  atorvastatin (LIPITOR) 80 MG tablet Take 1 tablet (80 mg total) by mouth daily. 04/02/23   Quintella Reichert, MD  lisinopril (ZESTRIL) 20 MG tablet Take 1 tablet (20 mg total) by mouth daily. 04/02/23   Quintella Reichert, MD  metoprolol tartrate (LOPRESSOR) 25 MG tablet TAKE 1/2 TABLET TWICE A DAY BY MOUTH 04/02/23   Turner, Cornelious Bryant, MD  nitroGLYCERIN (NITROSTAT) 0.4 MG SL tablet Place 1 tablet (0.4 mg total) under the tongue every 5 (five) minutes as needed for chest pain. 04/02/23   Quintella Reichert, MD  sildenafil (REVATIO) 20 MG tablet TAKE 2 TO 5 TABLETS (40-100MG ) BY MOUTH 30 MINUTES PRIOR TO INTERCOURSE AS DIRECTED 07/15/22   Copland, Karleen Hampshire, MD  ticagrelor (BRILINTA) 90 MG TABS tablet Take 1 tablet (90 mg total) by mouth 2 (two) times daily. 06/28/23   Sharlene Dory, PA-C    Family History Family History  Problem Relation Age of Onset   Hypertension Father    Colon cancer Neg Hx    Esophageal cancer Neg Hx    Rectal cancer Neg Hx  Stomach cancer Neg Hx     Social History Social History   Tobacco Use   Smoking status: Never   Smokeless tobacco: Never  Vaping Use   Vaping status: Never Used  Substance Use Topics   Alcohol use: Yes    Comment: very rare   Drug use: No     Allergies   Patient has no known allergies.   Review of Systems Review of Systems  Constitutional:  Negative for chills and fever.  Eyes:  Positive for discharge, redness and itching. Negative for pain and visual disturbance.  Musculoskeletal:  Negative for arthralgias, gait problem and joint swelling.  Skin:  Positive for wound. Negative for color change.  Neurological:  Negative for weakness and  numbness.     Physical Exam Triage Vital Signs ED Triage Vitals  Encounter Vitals Group     BP 09/15/23 1816 123/78     Systolic BP Percentile --      Diastolic BP Percentile --      Pulse Rate 09/15/23 1816 (!) 53     Resp 09/15/23 1816 18     Temp 09/15/23 1816 97.6 F (36.4 C)     Temp src --      SpO2 09/15/23 1816 97 %     Weight --      Height --      Head Circumference --      Peak Flow --      Pain Score 09/15/23 1818 0     Pain Loc --      Pain Education --      Exclude from Growth Chart --    No data found.  Updated Vital Signs BP 123/78   Pulse (!) 53   Temp 97.6 F (36.4 C)   Resp 18   SpO2 97%   Visual Acuity Right Eye Distance:   Left Eye Distance:   Bilateral Distance:    Right Eye Near:   Left Eye Near:    Bilateral Near:     Physical Exam Constitutional:      General: He is not in acute distress. HENT:     Mouth/Throat:     Mouth: Mucous membranes are moist.  Eyes:     General: Lids are normal. Vision grossly intact.     Extraocular Movements: Extraocular movements intact.     Conjunctiva/sclera:     Right eye: Right conjunctiva is injected.     Pupils: Pupils are equal, round, and reactive to light.  Cardiovascular:     Rate and Rhythm: Normal rate and regular rhythm.  Pulmonary:     Effort: Pulmonary effort is normal. No respiratory distress.  Musculoskeletal:       Feet:  Skin:    General: Skin is warm and dry.  Neurological:     Mental Status: He is alert.      UC Treatments / Results  Labs (all labs ordered are listed, but only abnormal results are displayed) Labs Reviewed - No data to display  EKG   Radiology No results found.  Procedures Procedures (including critical care time)  Medications Ordered in UC Medications - No data to display  Initial Impression / Assessment and Plan / UC Course  I have reviewed the triage vital signs and the nursing notes.  Pertinent labs & imaging results that were  available during my care of the patient were reviewed by me and considered in my medical decision making (see chart for details).    Right eye  conjunctivitis.  Plantar wart of left foot.  Afebrile and vital signs are stable.  Treating conjunctivitis with Polytrim eyedrops.  Instructed patient to follow-up with his PCP if he is not improving.  Education provided on conjunctivitis.  Instructed patient to follow-up with a podiatrist for evaluation of his plantar wart.  Education provided on plantar wart.  Patient agrees to plan of care.  Final Clinical Impressions(s) / UC Diagnoses   Final diagnoses:  Acute conjunctivitis of right eye, unspecified acute conjunctivitis type  Plantar wart of left foot     Discharge Instructions      Use the eye drops as directed.  Follow-up with your primary care provider if your symptoms are not improving.    Follow up with a podiatrist for your plantar wart.  [facility] [address] [place], [place] [address] Phone: [phone]      ED Prescriptions     Medication Sig Dispense Auth. Provider   trimethoprim-polymyxin b (POLYTRIM) ophthalmic solution Place 1 drop into both eyes 4 (four) times daily for 7 days. 10 mL Mickie Bail, NP      PDMP not reviewed this encounter.   Mickie Bail, NP 09/15/23 1911

## 2023-09-15 NOTE — ED Triage Notes (Signed)
 Patient to Urgent Care with multiple complaints.  Right eye redness- woke up this morning with redness and a small bump. Some irritation. Has been rinsing and using refresh eye drops.  Left foot pain- using compound W for a possible plantar wart. Symptoms x1 week. Has seen some improvement w/ pain and soreness.

## 2023-09-15 NOTE — Discharge Instructions (Addendum)
 Use the eye drops as directed.  Follow-up with your primary care provider if your symptoms are not improving.    Follow up with a podiatrist for your plantar wart.  [facility] [address] [place], [place] [address] Phone: [phone]

## 2023-09-21 ENCOUNTER — Ambulatory Visit (INDEPENDENT_AMBULATORY_CARE_PROVIDER_SITE_OTHER): Admitting: Podiatry

## 2023-09-21 DIAGNOSIS — L989 Disorder of the skin and subcutaneous tissue, unspecified: Secondary | ICD-10-CM

## 2023-09-21 NOTE — Progress Notes (Signed)
  Subjective:  Patient ID: Steven Kemp, male    DOB: 05/02/1966,  MRN: [id number]  Chief Complaint  Patient presents with   Foot Pain    Left foot pain hard place in the middle of foot causing discomfort     58 y.o. male presents with the above complaint.  Patient presents with left submet 1 porokeratotic lesion painful to touch is progressive gotten worse worse with ambulation worse with pressure he would like to discuss treatment options for the benign skin lesion has been present for quite some time pain scale 7 out of 10 dull aching nature   Review of Systems: Negative except as noted in the HPI. Denies N/V/F/Ch.  Past Medical History:  Diagnosis Date   Borderline diabetes    CAD (coronary artery disease), native coronary artery    a. STEMI 10/22/2016 s/p DES to the first diagonal with residual moderate disease in the LAD, right PDA and distal LAD.   Hyperlipidemia    Hypertension     Current Outpatient Medications:    ALPRAZolam (XANAX) 0.5 MG tablet, Take 1 tablet (0.5 mg total) by mouth 2 (two) times daily as needed for anxiety., Disp: 5 tablet, Rfl: 0   atorvastatin (LIPITOR) 80 MG tablet, Take 1 tablet (80 mg total) by mouth daily., Disp: 90 tablet, Rfl: 1   lisinopril (ZESTRIL) 20 MG tablet, Take 1 tablet (20 mg total) by mouth daily., Disp: 90 tablet, Rfl: 1   metoprolol tartrate (LOPRESSOR) 25 MG tablet, TAKE 1/2 TABLET TWICE A DAY BY MOUTH, Disp: 90 tablet, Rfl: 1   nitroGLYCERIN (NITROSTAT) 0.4 MG SL tablet, Place 1 tablet (0.4 mg total) under the tongue every 5 (five) minutes as needed for chest pain., Disp: 25 tablet, Rfl: 2   sildenafil (REVATIO) 20 MG tablet, TAKE 2 TO 5 TABLETS (40-100MG ) BY MOUTH 30 MINUTES PRIOR TO INTERCOURSE AS DIRECTED, Disp: 100 tablet, Rfl: 5   ticagrelor (BRILINTA) 90 MG TABS tablet, Take 1 tablet (90 mg total) by mouth 2 (two) times daily., Disp: 180 tablet, Rfl: 3   trimethoprim-polymyxin b (POLYTRIM) ophthalmic solution, Place 1 drop  into both eyes 4 (four) times daily for 7 days., Disp: 10 mL, Rfl: 0  Social History   Tobacco Use  Smoking Status Never  Smokeless Tobacco Never    No Known Allergies Objective:  There were no vitals filed for this visit. There is no height or weight on file to calculate BMI. Constitutional Well developed. Well nourished.  Vascular Dorsalis pedis pulses palpable bilaterally. Posterior tibial pulses palpable bilaterally. Capillary refill normal to all digits.  No cyanosis or clubbing noted. Pedal hair growth normal.  Neurologic Normal speech. Oriented to person, place, and time. Epicritic sensation to light touch grossly present bilaterally.  Dermatologic Left submetatarsal 1 porokeratosis with central nucleated core noted pain on palpation.  There is some pinpoint bleeding noted  Orthopedic: Normal joint ROM without pain or crepitus bilaterally. No visible deformities. No bony tenderness.   Radiographs: None Assessment:   1. Benign skin lesion    Plan:  Patient was evaluated and treated and all questions answered.  Left submetatarsal 1 benign skin lesion/porokeratotic lesion --Lesion was debrided today without complications. Hemostasis was achieved and the area was cleaned. Cantharone was applied followed by an occlusive bandage. Post procedure complications were discussed. Monitor for signs or symptoms of infection and directed to call the office mainly should any occur.   No follow-ups on file.

## 2023-10-05 ENCOUNTER — Ambulatory Visit (INDEPENDENT_AMBULATORY_CARE_PROVIDER_SITE_OTHER): Admitting: Podiatry

## 2023-10-05 DIAGNOSIS — L989 Disorder of the skin and subcutaneous tissue, unspecified: Secondary | ICD-10-CM

## 2023-10-05 NOTE — Progress Notes (Signed)
  Subjective:  Patient ID: Steven Kemp, male    DOB: 1965-09-14,  MRN: [id number]  Chief Complaint  Patient presents with   Benign skin lesion    Left foot follow up for Benign skin lesion    58 y.o. male presents with the above complaint.  Patient presents for follow-up of left submetatarsal 1 porokeratotic lesion.  Patient states he is doing good.  Cantharone therapy helped considerably denies any other acute complaints  Review of Systems: Negative except as noted in the HPI. Denies N/V/F/Ch.  Past Medical History:  Diagnosis Date   Borderline diabetes    CAD (coronary artery disease), native coronary artery    a. STEMI 10/22/2016 s/p DES to the first diagonal with residual moderate disease in the LAD, right PDA and distal LAD.   Hyperlipidemia    Hypertension     Current Outpatient Medications:    ALPRAZolam (XANAX) 0.5 MG tablet, Take 1 tablet (0.5 mg total) by mouth 2 (two) times daily as needed for anxiety., Disp: 5 tablet, Rfl: 0   atorvastatin (LIPITOR) 80 MG tablet, Take 1 tablet (80 mg total) by mouth daily., Disp: 90 tablet, Rfl: 1   lisinopril (ZESTRIL) 20 MG tablet, Take 1 tablet (20 mg total) by mouth daily., Disp: 90 tablet, Rfl: 1   metoprolol tartrate (LOPRESSOR) 25 MG tablet, TAKE 1/2 TABLET TWICE A DAY BY MOUTH, Disp: 90 tablet, Rfl: 1   nitroGLYCERIN (NITROSTAT) 0.4 MG SL tablet, Place 1 tablet (0.4 mg total) under the tongue every 5 (five) minutes as needed for chest pain., Disp: 25 tablet, Rfl: 2   sildenafil (REVATIO) 20 MG tablet, TAKE 2 TO 5 TABLETS (40-100MG ) BY MOUTH 30 MINUTES PRIOR TO INTERCOURSE AS DIRECTED, Disp: 100 tablet, Rfl: 5   ticagrelor (BRILINTA) 90 MG TABS tablet, Take 1 tablet (90 mg total) by mouth 2 (two) times daily., Disp: 180 tablet, Rfl: 3  Social History   Tobacco Use  Smoking Status Never  Smokeless Tobacco Never    No Known Allergies Objective:  There were no vitals filed for this visit. There is no height or weight on  file to calculate BMI. Constitutional Well developed. Well nourished.  Vascular Dorsalis pedis pulses palpable bilaterally. Posterior tibial pulses palpable bilaterally. Capillary refill normal to all digits.  No cyanosis or clubbing noted. Pedal hair growth normal.  Neurologic Normal speech. Oriented to person, place, and time. Epicritic sensation to light touch grossly present bilaterally.  Dermatologic No further left submetatarsal 1 porokeratosis without central nucleated core noted pain on palpation.  There is no pinpoint bleeding noted  Orthopedic: Normal joint ROM without pain or crepitus bilaterally. No visible deformities. No bony tenderness.   Radiographs: None Assessment:   No diagnosis found.  Plan:  Patient was evaluated and treated and all questions answered.  Left submetatarsal 1 benign skin lesion/porokeratotic lesion -- Clinically lesion has completely reepithelialized.  No further signs of recurrence noted.  At this time no further Cantharone therapy indicated.  I discussed with the patient extensive detail about prevention of any foot and ankle issues on future he will come back and see me   No follow-ups on file.

## 2023-10-08 ENCOUNTER — Encounter (HOSPITAL_BASED_OUTPATIENT_CLINIC_OR_DEPARTMENT_OTHER): Payer: Self-pay

## 2023-10-08 LAB — CBC
Hematocrit: 48 % (ref 37.5–51.0)
Hemoglobin: 16.3 g/dL (ref 13.0–17.7)
MCH: 30.2 pg (ref 26.6–33.0)
MCHC: 34 g/dL (ref 31.5–35.7)
MCV: 89 fL (ref 79–97)
Platelets: 154 10*3/uL (ref 150–450)
RBC: 5.4 x10E6/uL (ref 4.14–5.80)
RDW: 12.4 % (ref 11.6–15.4)
WBC: 5.8 10*3/uL (ref 3.4–10.8)

## 2023-10-08 LAB — COMPREHENSIVE METABOLIC PANEL WITH GFR
ALT: 34 IU/L (ref 0–44)
AST: 34 IU/L (ref 0–40)
Albumin: 4.4 g/dL (ref 3.8–4.9)
Alkaline Phosphatase: 102 IU/L (ref 44–121)
BUN/Creatinine Ratio: 15 (ref 9–20)
BUN: 15 mg/dL (ref 6–24)
Bilirubin Total: 0.4 mg/dL (ref 0.0–1.2)
CO2: 24 mmol/L (ref 20–29)
Calcium: 9.1 mg/dL (ref 8.7–10.2)
Chloride: 105 mmol/L (ref 96–106)
Creatinine, Ser: 0.98 mg/dL (ref 0.76–1.27)
Globulin, Total: 2.8 g/dL (ref 1.5–4.5)
Glucose: 105 mg/dL — ABNORMAL HIGH (ref 70–99)
Potassium: 4.2 mmol/L (ref 3.5–5.2)
Sodium: 143 mmol/L (ref 134–144)
Total Protein: 7.2 g/dL (ref 6.0–8.5)
eGFR: 90 mL/min/{1.73_m2} (ref >59)

## 2023-10-08 LAB — LIPID PANEL
Chol/HDL Ratio: 3.5 ratio (ref 0.0–5.0)
Cholesterol, Total: 131 mg/dL (ref 100–199)
HDL: 37 mg/dL — ABNORMAL LOW (ref >39)
LDL Chol Calc (NIH): 82 mg/dL (ref 0–99)
Triglycerides: 56 mg/dL (ref 0–149)
VLDL Cholesterol Cal: 12 mg/dL (ref 5–40)

## 2023-10-11 ENCOUNTER — Other Ambulatory Visit: Payer: Self-pay | Admitting: *Deleted

## 2023-10-11 DIAGNOSIS — E785 Hyperlipidemia, unspecified: Secondary | ICD-10-CM

## 2023-11-04 ENCOUNTER — Other Ambulatory Visit: Payer: Self-pay

## 2023-11-04 MED ORDER — ATORVASTATIN CALCIUM 80 MG PO TABS: 80.0000 mg | ORAL_TABLET | Freq: Every day | ORAL | 2 refills | Status: DC

## 2023-11-04 MED ORDER — LISINOPRIL 20 MG PO TABS: 20.0000 mg | ORAL_TABLET | Freq: Every day | ORAL | 2 refills | Status: DC

## 2023-11-04 MED ORDER — METOPROLOL TARTRATE 25 MG PO TABS: ORAL_TABLET | ORAL | 2 refills | Status: DC

## 2024-02-25 ENCOUNTER — Emergency Department (HOSPITAL_COMMUNITY)
Admission: EM | Admit: 2024-02-25 | Discharge: 2024-02-25 | Disposition: A | Discharge status: Home / Self Care | Attending: Emergency Medicine | Admitting: Emergency Medicine

## 2024-02-25 ENCOUNTER — Other Ambulatory Visit: Payer: Self-pay

## 2024-02-25 ENCOUNTER — Encounter (HOSPITAL_COMMUNITY): Payer: Self-pay | Admitting: *Deleted

## 2024-02-25 ENCOUNTER — Emergency Department (HOSPITAL_COMMUNITY)

## 2024-02-25 DIAGNOSIS — R61 Generalized hyperhidrosis: Secondary | ICD-10-CM | POA: Insufficient documentation

## 2024-02-25 DIAGNOSIS — R202 Paresthesia of skin: Secondary | ICD-10-CM | POA: Insufficient documentation

## 2024-02-25 DIAGNOSIS — M79622 Pain in left upper arm: Secondary | ICD-10-CM | POA: Diagnosis not present

## 2024-02-25 DIAGNOSIS — I2510 Atherosclerotic heart disease of native coronary artery without angina pectoris: Secondary | ICD-10-CM | POA: Insufficient documentation

## 2024-02-25 DIAGNOSIS — Z79899 Other long term (current) drug therapy: Secondary | ICD-10-CM | POA: Insufficient documentation

## 2024-02-25 DIAGNOSIS — R079 Chest pain, unspecified: Secondary | ICD-10-CM | POA: Insufficient documentation

## 2024-02-25 DIAGNOSIS — I10 Essential (primary) hypertension: Secondary | ICD-10-CM | POA: Insufficient documentation

## 2024-02-25 LAB — CBC
HCT: 48.3 % (ref 39.0–52.0)
Hemoglobin: 16.5 g/dL (ref 13.0–17.0)
MCH: 30 pg (ref 26.0–34.0)
MCHC: 34.2 g/dL (ref 30.0–36.0)
MCV: 87.8 fL (ref 80.0–100.0)
Platelets: 202 K/uL (ref 150–400)
RBC: 5.5 MIL/uL (ref 4.22–5.81)
RDW: 11.9 % (ref 11.5–15.5)
WBC: 7.4 K/uL (ref 4.0–10.5)
nRBC: 0 % (ref 0.0–0.2)

## 2024-02-25 LAB — BASIC METABOLIC PANEL WITH GFR
Anion gap: 11 (ref 5–15)
BUN: 10 mg/dL (ref 6–20)
CO2: 24 mmol/L (ref 22–32)
Calcium: 9 mg/dL (ref 8.9–10.3)
Chloride: 102 mmol/L (ref 98–111)
Creatinine, Ser: 0.88 mg/dL (ref 0.61–1.24)
GFR, Estimated: >60 mL/min (ref >60)
Glucose, Bld: 95 mg/dL (ref 70–99)
Potassium: 4 mmol/L (ref 3.5–5.1)
Sodium: 137 mmol/L (ref 135–145)

## 2024-02-25 LAB — I-STAT CHEM 8, ED
BUN: 14 mg/dL (ref 6–20)
Calcium, Ion: 1.07 mmol/L — ABNORMAL LOW (ref 1.15–1.40)
Chloride: 105 mmol/L (ref 98–111)
Creatinine, Ser: 0.9 mg/dL (ref 0.61–1.24)
Glucose, Bld: 96 mg/dL (ref 70–99)
HCT: 49 % (ref 39.0–52.0)
Hemoglobin: 16.7 g/dL (ref 13.0–17.0)
Potassium: 4.8 mmol/L (ref 3.5–5.1)
Sodium: 138 mmol/L (ref 135–145)
TCO2: 26 mmol/L (ref 22–32)

## 2024-02-25 LAB — TROPONIN I (HIGH SENSITIVITY)
Troponin I (High Sensitivity): 4 ng/L (ref <18)
Troponin I (High Sensitivity): 3 ng/L (ref <18)

## 2024-02-25 LAB — D-DIMER, QUANTITATIVE: D-Dimer, Quant: <0.27 ug{FEU}/mL (ref 0.00–0.50)

## 2024-02-25 NOTE — Discharge Instructions (Signed)
 Deston Bilyeu  Thank you for allowing us  to take care of you today.  You came to the Emergency Department today because earlier today you had pain in your chest and abnormal sensation in your left arm that was reminiscent of your prior heart attack.  Here in the emergency department your heart number is normal, therefore you are not currently having a heart attack.  Additionally your blood clot risk factor number was negative, your chest x-ray did not show any abnormality such as pneumonia or a collapsed lung, the rest of your labs were reassuring.  You do not currently have an emergency cause of your chest pain, therefore we are going to discharge you.  We would like you to follow-up with your usual cardiologist for a reevaluation, sometimes this chest pain that is not a heart attack can be a sign that you are starting to build up disease in your blood vessels in your heart again, therefore you are due for another stress test to reevaluate your blood vessels   To-Do: 1. Please follow-up with your primary doctor within 2 week / as soon as possible.   Please return to the Emergency Department or call 911 if you experience have worsening of your symptoms, or do not get better, new or different chest pain, shortness of breath, severe or significantly worsening pain, high fever, severe confusion, pass out or have any reason to think that you need emergency medical care.   We hope you feel better soon.   Mitzie Later, MD Department of Emergency Medicine [facility]

## 2024-02-25 NOTE — ED Triage Notes (Signed)
 Pt c.o central chest pain since last night and left arm numbness that started today. Hx of MI 6 years ago and states it feels similar.

## 2024-02-25 NOTE — ED Provider Notes (Signed)
 [facility] Provider Note   CSN: [id number] Arrival date & time: 02/25/24  1625     History Chief Complaint  Patient presents with   Chest Pain   Numbness     Chest Pain  HPI: Steven Kemp is a 58 y.o. male with history perinent prior MI 6 years ago, HTN, HLD, BPPV, CAD who presents complaining of chest pain. Patient arrived via POV.  History provided by patient.  No interpreter required during this encounter.  Patient reports that he has a remote history of a STEMI approximately 6 years ago.  Reports that at that time he had substernal chest pain, left upper extremity pain, and diaphoresis.  Reports that he was in his normal state of health up until yesterday.  Reports that he noted some chest pressure when he went to bed last night, however did not have any other symptoms.  Reports that chest pressure was still present this morning when he woke up, and he had paresthesias in his left upper extremity.  Denies any specific aggravating or alleviating factors throughout the day today, however he became persistently more worried about it as it persisted through the day.  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  Reports that given persistence of his symptoms he decided to come to the emergency department for further evaluation.  He reports that symptoms have partially improved, however he still has a mild discomfort in his chest as well as slight tingling in his left upper extremity.  Denies any fever, chills, shortness of breath, nausea, vomiting, diarrhea, abdominal pain.  Patient's recorded medical, surgical, social, medication list and allergies were reviewed in the Snapshot window as part of the initial history.   Prior to Admission medications   Medication Sig Start Date End Date Taking? Authorizing Provider  ALPRAZolam  (XANAX ) 0.5 MG  tablet Take 1 tablet (0.5 mg total) by mouth 2 (two) times daily as needed for anxiety. 06/04/23   Copland, Jacques, MD  atorvastatin  (LIPITOR ) 80 MG tablet Take 1 tablet (80 mg total) by mouth daily. 11/04/23   Lucien Orren SAILOR, PA-C  lisinopril  (ZESTRIL ) 20 MG tablet Take 1 tablet (20 mg total) by mouth daily. 11/04/23   Conte, Tessa N, PA-C  metoprolol  tartrate (LOPRESSOR ) 25 MG tablet TAKE 1/2 TABLET TWICE A DAY BY MOUTH 11/04/23   Conte, Tessa N, PA-C  nitroGLYCERIN  (NITROSTAT ) 0.4 MG SL tablet Place 1 tablet (0.4 mg total) under the tongue every 5 (five) minutes as needed for chest pain. 04/02/23   Shlomo Wilbert SAUNDERS, MD  sildenafil  (REVATIO ) 20 MG tablet TAKE 2 TO 5 TABLETS (40-100MG ) BY MOUTH 30 MINUTES PRIOR TO INTERCOURSE AS DIRECTED 07/15/22   Copland, Jacques, MD  ticagrelor  (BRILINTA ) 90 MG TABS tablet Take 1 tablet (90 mg total) by mouth 2 (two) times daily. 06/28/23   Lucien Orren SAILOR, PA-C     Allergies: Patient has no known allergies.   Review of Systems   ROS as per HPI  Physical Exam Updated Vital Signs BP (!) 140/98   Pulse (!) 59   Temp 97.8 F (36.6 C)   Resp 16   Ht 5' 7 (1.702 m)   Wt 78.2 kg   SpO2 100%   BMI 27.00 kg/m  Physical Exam Vitals and nursing note reviewed.  Constitutional:      General: He is not  in acute distress.    Appearance: He is well-developed.  HENT:     Head: Normocephalic and atraumatic.  Eyes:     Conjunctiva/sclera: Conjunctivae normal.  Cardiovascular:     Rate and Rhythm: Normal rate and regular rhythm.     Pulses:          Radial pulses are 2+ on the right side and 2+ on the left side.     Heart sounds: No murmur heard. Pulmonary:     Effort: Pulmonary effort is normal. No respiratory distress.     Breath sounds: Normal breath sounds.  Abdominal:     Palpations: Abdomen is soft.     Tenderness: There is no abdominal tenderness.  Musculoskeletal:        General: No swelling.     Cervical back: Neck supple.  Skin:    General:  Skin is warm and dry.     Capillary Refill: Capillary refill takes less than 2 seconds.  Neurological:     Mental Status: He is alert.  Psychiatric:        Mood and Affect: Mood normal.     ED Course/ Medical Decision Making/ A&P    Procedures Procedures   Medications Ordered in ED Medications - No data to display  Medical Decision Making:   Steven Kemp is a 58 y.o. male who presents for chest pain as per above.  Physical exam is pertinent for no focal abnormalities.   The differential includes but is not limited to , ACS, arrhythmia, pericardial tamponade, pericarditis, myocarditis, pneumonia, pneumothorax, esophageal, tear, perforated abdominal viscous, pulmonary embolism, aortic dissection, costochondritis, musculoskeletal chest wall pain, GERD.  Independent historian: None  External data reviewed: EKG  Labs: Ordered, Independent interpretation, and Details: Initial troponin 4, delta 3.  D-dimer undetectable.  CBC without leukocytosis, anemia, thrombocytopenia.  BMP without AKI or emergent electrolyte derangement.  Radiology: Ordered, Independent interpretation, Details: Chest x-ray without focal airspace opacification, cardiomediastinal silhouette derangement, pneumothorax, pleural effusion, bony derangement, and All images reviewed independently.  Agree with radiology report at this time.   DG Chest 2 View Result Date: 02/25/2024 CLINICAL DATA:  Chest pain started last night, left arm numbness started today. EXAM: CHEST - 2 VIEW COMPARISON:  07/23/2022 FINDINGS: Heart size within normal limits for AP radiography. The lungs appear clear. No blunting of the costophrenic angles. No significant bony findings. IMPRESSION: 1. No active cardiopulmonary disease is radiographically apparent. Electronically Signed   By: Ryan Salvage M.D.   On: 02/25/2024 17:38    EKG/Medicine tests: Ordered and Independent interpretation EKG Interpretation Date/Time:  Friday February 25 2024 16:32:38 EDT Ventricular Rate:  64 PR Interval:  142 QRS Duration:  88 QT Interval:  360 QTC Calculation: 371 R Axis:   124  Text Interpretation: Normal sinus rhythm Right axis deviation Abnormal ECG When compared with ECG of 28-Jun-2023 11:34, No significant change since last tracing Confirmed by Rogelia Satterfield ([id number]) on 02/25/2024 5:15:47 PM  Interventions: None  See the EMR for full details regarding lab and imaging results.  The ECG reveals no anatomical ischemia representing STEMI, New-Onset Arrhythmia, or ischemic equivalent.  He has been risk stratified with a HEAR score of 4. Initial troponin is 4; delta troponin is 3.  The patient's presentation, the patient being hemodynamically stable, and the ECG are not consistent with Pericardial Tamponade. The patient's pain is not positional. This in conjunction with the lack of PR depressions and ST elevations on the ECG are reassuring against Pericarditis. The  patient's non-elevated troponin and ECG are also inconsistent with Myocarditis.  The CXR is unremarkable for focal airspace disease.  The patient is afebrile and denies productive cough.  Therefore, I do not suspect Pneumonia. There is no evidence of Pneumothorax on physical exam or on the CXR. CXR shows no evidence of Esophageal Tear and there is no recent intractable emesis or esophageal instrumentation. There is no peritonitis or free air on CXR worrisome for a Perforated Abdominal Viscous.  Pulmonary Embolism is on the differential. The patient is at risk via the Revised Geneva Criteria. Therefore, we will further risk stratify the patient with a d-dimer.  This was WNL. Therefore, a CTA is not indicated.  The patient's pain is not tearing and it does not radiate to back. Pulses are present bilaterally in both the upper and lower extremities. CXR does not show a widened mediastinum. I have a very low suspicion for Aortic Dissection.  Overall, reassuring workup makes underlying  emergent etiology of symptoms unlikely.  Discussed this with patient at bedside, who expresses understanding, and reports that symptoms have resolved.  Did discuss that he does need cardiology follow-up for further reevaluation given his cardiac risk factors and history of CAD.  Patient reports that his last stress test was approximately 1 year ago, and last cardiology appointment was 6 months ago.  Discussed need for further follow-up for reevaluation for additional stress test, patient expressed understanding, referred back to usual cardiologist, discharged in stable condition.  Presentation is most consistent with acute complicated illness  Discussion of management or test interpretations with external provider(s): Not indicated  Risk Drugs:None  Disposition: DISCHARGE: I believe that the patient is safe for discharge home with outpatient follow-up. Patient was informed of all pertinent physical exam, laboratory, and imaging findings. Patient's suspected etiology of their symptom presentation was discussed with the patient and all questions were answered. We discussed following up with PCP and cardiologist. I provided thorough ED return precautions. The patient feels safe and comfortable with this plan.  MDM generated using voice dictation software and may contain dictation errors.  Please contact me for any clarification or with any questions.  Clinical Impression:  1. Chest pain, unspecified type      Discharge   Final Clinical Impression(s) / ED Diagnoses Final diagnoses:  Chest pain, unspecified type    Rx / DC Orders ED Discharge Orders          Ordered    Ambulatory referral to Cardiology       Comments: If you have not heard from the Cardiology office within the next 72 hours please call [phone].   02/25/24 2045             Rogelia Jerilynn RAMAN, MD 02/26/24 [phone]

## 2024-03-25 ENCOUNTER — Other Ambulatory Visit: Payer: Self-pay | Admitting: Cardiology

## 2024-05-31 ENCOUNTER — Ambulatory Visit
Admission: EM | Admit: 2024-05-31 | Discharge: 2024-05-31 | Disposition: A | Discharge status: Home / Self Care | Attending: Emergency Medicine | Admitting: Emergency Medicine

## 2024-05-31 ENCOUNTER — Encounter: Payer: Self-pay | Admitting: Emergency Medicine

## 2024-05-31 DIAGNOSIS — J069 Acute upper respiratory infection, unspecified: Secondary | ICD-10-CM | POA: Diagnosis not present

## 2024-05-31 DIAGNOSIS — R0981 Nasal congestion: Secondary | ICD-10-CM | POA: Diagnosis not present

## 2024-05-31 MED ORDER — AMOXICILLIN-POT CLAVULANATE 875-125 MG PO TABS: 1.0000 | ORAL_TABLET | Freq: Two times a day (BID) | ORAL | 0 refills | Status: DC

## 2024-05-31 MED ORDER — IPRATROPIUM BROMIDE 0.03 % NA SOLN: 2.0000 | Freq: Two times a day (BID) | NASAL | 0 refills | Status: DC

## 2024-05-31 NOTE — ED Triage Notes (Signed)
 Patient reports nasal congestion x 2 weeks. Patient has taken OTC cold and flu medication with no relief.

## 2024-05-31 NOTE — ED Provider Notes (Signed)
 Steven Kemp    CSN: [id number] Arrival date & time: 05/31/24  1738      History   Chief Complaint Chief Complaint  Patient presents with   Nasal Congestion    HPI Steven Kemp is a 58 y.o. male.   Patient presents for evaluation of nasal congestion present for 2 weeks.  Has attempted use of Coricidin which has been ineffective.  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  Denies fever, cough, ear pain or sore throat.   Past Medical History:  Diagnosis Date   Borderline diabetes    CAD (coronary artery disease), native coronary artery    a. STEMI 10/22/2016 s/p DES to the first diagonal with residual moderate disease in the LAD, right PDA and distal LAD.   Hyperlipidemia    Hypertension     Patient Active Problem List   Diagnosis Date Noted   CAD (coronary artery disease) 12/28/2016   Acute MI, lateral wall (HCC)    Borderline diabetes 09/08/2013   BPPV (benign paroxysmal positional vertigo) 02/10/2013   Hyperlipidemia LDL goal <70 10/09/2009   Essential hypertension 10/09/2009   External hemorrhoids 07/24/2009   Allergic rhinitis 02/03/2007    Past Surgical History:  Procedure Laterality Date   CORONARY STENT INTERVENTION N/A 10/22/2016   Procedure: Coronary Stent Intervention;  Surgeon: Candyce GORMAN Reek, MD;  Location: [facility] INVASIVE CV LAB;  Service: Cardiovascular;  Laterality: N/A;   LEFT HEART CATH AND CORONARY ANGIOGRAPHY N/A 10/22/2016   Procedure: Left Heart Cath and Coronary Angiography;  Surgeon: Candyce GORMAN Reek, MD;  Location: [facility] INVASIVE CV LAB;  Service: Cardiovascular;  Laterality: N/A;       Home Medications    Prior to Admission medications   Medication Sig Start Date End Date Taking? Authorizing Provider  ALPRAZolam  (XANAX ) 0.5 MG tablet Take 1 tablet (0.5 mg total) by mouth 2 (two) times daily as needed for anxiety. 06/04/23   Copland, Jacques, MD  atorvastatin  (LIPITOR ) 80 MG tablet Take 1 tablet (80 mg total)  by mouth daily. 11/04/23   Lucien Orren SAILOR, PA-C  lisinopril  (ZESTRIL ) 20 MG tablet Take 1 tablet (20 mg total) by mouth daily. 11/04/23   Conte, Tessa N, PA-C  metoprolol  tartrate (LOPRESSOR ) 25 MG tablet TAKE 1/2 TABLET TWICE A DAY BY MOUTH 11/04/23   Conte, Tessa N, PA-C  nitroGLYCERIN  (NITROSTAT ) 0.4 MG SL tablet DISSOLVE ONE TABLET UNDER THE TONGUE EVERY 5 MINUTES AS NEEDED FOR CHEST PAIN. 03/27/24   Lucien Orren SAILOR, PA-C  sildenafil  (REVATIO ) 20 MG tablet TAKE 2 TO 5 TABLETS (40-100MG ) BY MOUTH 30 MINUTES PRIOR TO INTERCOURSE AS DIRECTED 07/15/22   Copland, Jacques, MD  ticagrelor  (BRILINTA ) 90 MG TABS tablet Take 1 tablet (90 mg total) by mouth 2 (two) times daily. 06/28/23   Lucien Orren SAILOR, PA-C    Family History Family History  Problem Relation Age of Onset   Hypertension Father    Colon cancer Neg Hx    Esophageal cancer Neg Hx    Rectal cancer Neg Hx    Stomach cancer Neg Hx     Social History Social History   Tobacco Use   Smoking status: Never   Smokeless tobacco: Never  Vaping Use   Vaping status: Never Used  Substance Use Topics   Alcohol use: Yes    Comment: very rare   Drug use: No     Allergies   Patient has no known allergies.   Review of Systems Review of  Systems  Constitutional: Negative.   HENT:  Positive for congestion. Negative for dental problem, drooling, ear discharge, ear pain, facial swelling, hearing loss, mouth sores, nosebleeds, postnasal drip, rhinorrhea, sinus pressure, sinus pain, sneezing, sore throat, tinnitus, trouble swallowing and voice change.   Respiratory: Negative.    Gastrointestinal: Negative.      Physical Exam Triage Vital Signs ED Triage Vitals  Encounter Vitals Group     BP 05/31/24 1754 130/82     Girls Systolic BP Percentile --      Girls Diastolic BP Percentile --      Boys Systolic BP Percentile --      Boys Diastolic BP Percentile --      Pulse Rate 05/31/24 1754 73     Resp 05/31/24 1754 18     Temp 05/31/24 1754  (!) 97.4 F (36.3 C)     Temp Source 05/31/24 1754 Oral     SpO2 05/31/24 1754 97 %     Weight --      Height --      Head Circumference --      Peak Flow --      Pain Score 05/31/24 1756 0     Pain Loc --      Pain Education --      Exclude from Growth Chart --    No data found.  Updated Vital Signs BP 130/82 (BP Location: Left Arm)   Pulse 73   Temp (!) 97.4 F (36.3 C) (Oral)   Resp 18   SpO2 97%   Visual Acuity Right Eye Distance:   Left Eye Distance:   Bilateral Distance:    Right Eye Near:   Left Eye Near:    Bilateral Near:     Physical Exam Constitutional:      Appearance: Normal appearance.  HENT:     Right Ear: Tympanic membrane, ear canal and external ear normal.     Left Ear: Tympanic membrane, ear canal and external ear normal.     Nose: Congestion present.  Eyes:     Extraocular Movements: Extraocular movements intact.  Cardiovascular:     Rate and Rhythm: Normal rate and regular rhythm.     Pulses: Normal pulses.     Heart sounds: Normal heart sounds.  Pulmonary:     Effort: Pulmonary effort is normal.     Breath sounds: Normal breath sounds.  Neurological:     Mental Status: He is alert and oriented to person, place, and time. Mental status is at baseline.      UC Treatments / Results  Labs (all labs ordered are listed, but only abnormal results are displayed) Labs Reviewed - No data to display  EKG   Radiology No results found.  Procedures Procedures (including critical care time)  Medications Ordered in UC Medications - No data to display  Initial Impression / Assessment and Plan / UC Course  I have reviewed the triage vital signs and the nursing notes.  Pertinent labs & imaging results that were available during my care of the patient were reviewed by me and considered in my medical decision making (see chart for details).  Acute URI, nasal congestion  Patient is in no signs of distress nor toxic appearing.  Vital signs  are stable.  Low suspicion for pneumonia, pneumothorax or bronchitis and therefore will defer imaging.  Viral testing deferred due to timeline.  Symptoms persisting for 2 weeks without signs of resolution.  Will place on Augmentin and additionally  prescribed ipratropium nasal spray.May use additional over-the-counter medications as needed for supportive care.  May follow-up with urgent care as needed if symptoms persist or worsen.   Final Clinical Impressions(s) / UC Diagnoses   Final diagnoses:  None   Discharge Instructions   None    ED Prescriptions   None    PDMP not reviewed this encounter.   Teresa Shelba SAUNDERS, NP 05/31/24 1806

## 2024-05-31 NOTE — Discharge Instructions (Signed)
 Begin Augmentin twice daily for 7 days for treatment of bacteria causing symptoms to linger  May use nasal spray twice daily as needed for additional comfort    You can take Tylenol  and/or Ibuprofen as needed for fever reduction and pain relief.  For congestion: take a daily anti-histamine like Zyrtec, Claritin, and a oral decongestant, such as pseudoephedrine.  You can also use Flonase 1-2 sprays in each nostril daily.   It is important to stay hydrated: drink plenty of fluids (water, gatorade/powerade/pedialyte, juices, or teas) to keep your throat moisturized and help further relieve irritation/discomfort.

## 2024-06-01 ENCOUNTER — Other Ambulatory Visit: Payer: Self-pay | Admitting: Family Medicine

## 2024-06-01 NOTE — Telephone Encounter (Signed)
 Copied from CRM #[id number]. Topic: Clinical - Medication Refill >> Jun 01, 2024  3:54 PM Thersia C wrote: Medication: ALPRAZolam  (XANAX ) 0.5 MG tablet sildenafil  (REVATIO ) 20 MG tablet   Has the patient contacted their pharmacy? Yes (Agent: If no, request that the patient contact the pharmacy for the refill. If patient does not wish to contact the pharmacy document the reason why and proceed with request.) (Agent: If yes, when and what did the pharmacy advise?)  This is the patient's preferred pharmacy:    [id number] - [place] ([place]), [place] - [address] [place] ([place])  [address] Phone: [phone] Fax: [phone]  Is this the correct pharmacy for this prescription? Yes If no, delete pharmacy and type the correct one.   Has the prescription been filled recently? No  Is the patient out of the medication? Yes  Has the patient been seen for an appointment in the last year OR does the patient have an upcoming appointment? Yes  Can we respond through MyChart? Yes  Agent: Please be advised that Rx refills may take up to 3 business days. We ask that you follow-up with your pharmacy.

## 2024-06-08 DIAGNOSIS — J339 Nasal polyp, unspecified: Secondary | ICD-10-CM | POA: Insufficient documentation

## 2024-06-11 ENCOUNTER — Encounter: Payer: Self-pay | Admitting: Family Medicine

## 2024-06-11 NOTE — Progress Notes (Unsigned)
 "    Steven Kemp. Steven Dwight, MD, CAQ Sports Medicine [facility] [address] [place], [address]  Phone: [phone]  FAX: [phone]  Steven Kemp - 58 y.o. male  MRN [id number]  Date of Birth: 1966-03-10  Date: 06/12/2024  PCP: Steven Mirza, MD  Referral: Steven Mirza, MD  No chief complaint on file.  Patient Care Team: Steven Mirza, MD as PCP - General Thukkani, Arun K, MD as PCP - Cardiology (Cardiology) Subjective:   Steven Kemp is a 58 y.o. pleasant patient who presents with the following:  Discussed the use of AI scribe software for clinical note transcription with the patient, who gave verbal consent to proceed.  History of Present Illness     Preventative Health Maintenance Visit:  Health Maintenance Summary Reviewed and updated, unless pt declines services.  Tobacco History Reviewed. Alcohol: No concerns, no excessive use Exercise Habits: Some activity, rec at least 30 mins 5 times a week STD concerns: no risk or activity to increase risk Drug Use: None  [name] is a very nice patient with a history of coronary disease and MI presents for complete physical exam.  I actually have not seen him for health maintenance exam in more than 3 years.  Prevnar 20 Shingrix Flu vaccine Colonoscopy  Health Maintenance  Topic Date Due   Pneumococcal Vaccine: 50+ Years (1 of 2 - PCV) Never done   Hepatitis B Vaccines 19-59 Average Risk (1 of 3 - 19+ 3-dose series) Never done   Zoster Vaccines- Shingrix (1 of 2) Never done   Influenza Vaccine  Never done   COVID-19 Vaccine (3 - 2025-26 season) 02/21/2024   Colonoscopy  03/15/2024   DTaP/Tdap/Td (2 - Td or Tdap) 02/17/2028   Hepatitis C Screening  Completed   HIV Screening  Completed   HPV VACCINES  Aged Out   Meningococcal B Vaccine  Aged Out   Immunization History  Administered Date(s) Administered   MMR 04/10/2021   PFIZER(Purple Top)SARS-COV-2  Vaccination 03/15/2020, 04/05/2020   Tdap 02/16/2018   Patient Active Problem List   Diagnosis Date Noted   Coronary artery disease involving native coronary artery of native heart without angina pectoris 12/28/2016    Priority: High   History of acute lateral wall MI     Priority: High   Hyperlipidemia LDL goal <70 10/09/2009    Priority: Medium    Essential hypertension 10/09/2009    Priority: Medium    Prediabetes 09/08/2013    Priority: Low   Allergic rhinitis 02/03/2007    Priority: Low   External hemorrhoids 07/24/2009    Past Medical History:  Diagnosis Date   CAD (coronary artery disease), native coronary artery    a. STEMI 10/22/2016 s/p DES to the first diagonal with residual moderate disease in the LAD, right PDA and distal LAD.   History of acute lateral wall MI    Hyperlipidemia    Hypertension    Prediabetes 09/08/2013    Past Surgical History:  Procedure Laterality Date   CORONARY STENT INTERVENTION N/A 10/22/2016   Procedure: Coronary Stent Intervention;  Surgeon: Steven GORMAN Reek, MD;  Location: [facility] INVASIVE CV LAB;  Service: Cardiovascular;  Laterality: N/A;   LEFT HEART CATH AND CORONARY ANGIOGRAPHY N/A 10/22/2016   Procedure: Left Heart Cath and Coronary Angiography;  Surgeon: Steven GORMAN Reek, MD;  Location: [facility] INVASIVE CV LAB;  Service: Cardiovascular;  Laterality: N/A;    Family History  Problem Relation Age of Onset  Hypertension Father    Colon cancer Neg Hx    Esophageal cancer Neg Hx    Rectal cancer Neg Hx    Stomach cancer Neg Hx     Social History   Social History Narrative   Not on file    Past Medical History, Surgical History, Social History, Family History, Problem List, Medications, and Allergies have been reviewed and updated if relevant.  Review of Systems: Pertinent positives are listed above.  Otherwise, a full 14 point review of systems has been done in full and it is negative except where it is noted  positive.  Objective:   There were no vitals taken for this visit. Ideal Body Weight:    Ideal Body Weight:   No results found.    07/15/2022   11:51 AM 03/19/2021    3:21 PM 02/16/2018    2:27 PM  Depression screen PHQ 2/9  Decreased Interest 0 0 0  Down, Depressed, Hopeless 0 0 0  PHQ - 2 Score 0 0 0     GEN: well developed, well nourished, no acute distress Eyes: conjunctiva and lids normal, PERRLA, EOMI ENT: TM clear, nares clear, oral exam WNL Neck: supple, no lymphadenopathy, no thyromegaly, no JVD Pulm: clear to auscultation and percussion, respiratory effort normal CV: regular rate and rhythm, S1-S2, no murmur, rub or gallop, no bruits, peripheral pulses normal and symmetric, no cyanosis, clubbing, edema or varicosities GI: soft, non-tender; no hepatosplenomegaly, masses; active bowel sounds all quadrants GU: deferred Lymph: no cervical, axillary or inguinal adenopathy MSK: gait normal, muscle tone and strength WNL, no joint swelling, effusions, discoloration, crepitus  SKIN: clear, good turgor, color WNL, no rashes, lesions, or ulcerations Neuro: normal mental status, normal strength, sensation, and motion Psych: alert; oriented to person, place and time, normally interactive and not anxious or depressed in appearance.  All labs reviewed with patient. Results for orders placed or performed during the hospital encounter of 02/25/24  Basic metabolic panel   Collection Time: 02/25/24  4:36 PM  Result Value Ref Range   Sodium 137 135 - 145 mmol/L   Potassium 4.0 3.5 - 5.1 mmol/L   Chloride 102 98 - 111 mmol/L   CO2 24 22 - 32 mmol/L   Glucose, Bld 95 70 - 99 mg/dL   BUN 10 6 - 20 mg/dL   Creatinine, Ser 9.11 0.61 - 1.24 mg/dL   Calcium  9.0 8.9 - 10.3 mg/dL   GFR, Estimated >39 >39 mL/min   Anion gap 11 5 - 15  CBC   Collection Time: 02/25/24  4:36 PM  Result Value Ref Range   WBC 7.4 4.0 - 10.5 Kemp/uL   RBC 5.50 4.22 - 5.81 MIL/uL   Hemoglobin 16.5 13.0 - 17.0  g/dL   HCT 51.6 60.9 - 47.9 %   MCV 87.8 80.0 - 100.0 fL   MCH 30.0 26.0 - 34.0 pg   MCHC 34.2 30.0 - 36.0 g/dL   RDW 88.0 88.4 - 84.4 %   Platelets 202 150 - 400 Kemp/uL   nRBC 0.0 0.0 - 0.2 %  Troponin I (High Sensitivity)   Collection Time: 02/25/24  4:36 PM  Result Value Ref Range   Troponin I (High Sensitivity) 4 <18 ng/L  I-stat chem 8, ED (not at [facility], [facility] or [facility])   Collection Time: 02/25/24  4:48 PM  Result Value Ref Range   Sodium 138 135 - 145 mmol/L   Potassium 4.8 3.5 - 5.1 mmol/L   Chloride 105 98 -  111 mmol/L   BUN 14 6 - 20 mg/dL   Creatinine, Ser 9.09 0.61 - 1.24 mg/dL   Glucose, Bld 96 70 - 99 mg/dL   Calcium , Ion 1.07 (L) 1.15 - 1.40 mmol/L   TCO2 26 22 - 32 mmol/L   Hemoglobin 16.7 13.0 - 17.0 g/dL   HCT 50.9 60.9 - 47.9 %  D-dimer, quantitative   Collection Time: 02/25/24  5:37 PM  Result Value Ref Range   D-Dimer, Quant <0.27 0.00 - 0.50 ug/mL-FEU  Troponin I (High Sensitivity)   Collection Time: 02/25/24  6:53 PM  Result Value Ref Range   Troponin I (High Sensitivity) 3 <18 ng/L    Assessment and Plan:     ICD-10-CM   1. Healthcare maintenance  Z00.00      Assessment & Plan   Health Maintenance Exam: The patient's preventative maintenance and recommended screening tests for an annual wellness exam were reviewed in full today. Brought up to date unless services declined.  Counselled on the importance of diet, exercise, and its role in overall health and mortality. The patient's FH and SH was reviewed, including their home life, tobacco status, and drug and alcohol status.  Follow-up in 1 year for physical exam or additional follow-up below.  Disposition: No follow-ups on file.  No orders of the defined types were placed in this encounter.  There are no discontinued medications. No orders of the defined types were placed in this encounter.   Signed,  Steven Kemp. Steven Benning, MD   Allergies as of 06/12/2024   No Known Allergies       Medication List        Accurate as of June 11, 2024  8:19 AM. If you have any questions, ask your nurse or doctor.          ALPRAZolam  0.5 MG tablet Commonly known as: XANAX  Take 1 tablet (0.5 mg total) by mouth 2 (two) times daily as needed for anxiety.   amoxicillin -clavulanate 875-125 MG tablet Commonly known as: AUGMENTIN  Take 1 tablet by mouth every 12 (twelve) hours.   atorvastatin  80 MG tablet Commonly known as: LIPITOR  Take 1 tablet (80 mg total) by mouth daily.   ipratropium 0.03 % nasal spray Commonly known as: ATROVENT  Place 2 sprays into both nostrils every 12 (twelve) hours.   lisinopril  20 MG tablet Commonly known as: ZESTRIL  Take 1 tablet (20 mg total) by mouth daily.   metoprolol  tartrate 25 MG tablet Commonly known as: LOPRESSOR  TAKE 1/2 TABLET TWICE A DAY BY MOUTH   nitroGLYCERIN  0.4 MG SL tablet Commonly known as: NITROSTAT  DISSOLVE ONE TABLET UNDER THE TONGUE EVERY 5 MINUTES AS NEEDED FOR CHEST PAIN.   sildenafil  20 MG tablet Commonly known as: REVATIO  TAKE 2 TO 5 TABLETS (40-100MG ) BY MOUTH 30 MINUTES PRIOR TO INTERCOURSE AS DIRECTED   ticagrelor  90 MG Tabs tablet Commonly known as: Brilinta  Take 1 tablet (90 mg total) by mouth 2 (two) times daily.       "

## 2024-06-12 ENCOUNTER — Ambulatory Visit (INDEPENDENT_AMBULATORY_CARE_PROVIDER_SITE_OTHER): Admitting: Family Medicine

## 2024-06-12 VITALS — BP 122/88 | HR 74 | Temp 99.0°F | Ht 66.75 in | Wt 171.4 lb

## 2024-06-12 DIAGNOSIS — Z0000 Encounter for general adult medical examination without abnormal findings: Secondary | ICD-10-CM | POA: Diagnosis not present

## 2024-06-12 DIAGNOSIS — Z125 Encounter for screening for malignant neoplasm of prostate: Secondary | ICD-10-CM | POA: Diagnosis not present

## 2024-06-12 DIAGNOSIS — Z79899 Other long term (current) drug therapy: Secondary | ICD-10-CM

## 2024-06-12 DIAGNOSIS — E785 Hyperlipidemia, unspecified: Secondary | ICD-10-CM | POA: Diagnosis not present

## 2024-06-12 DIAGNOSIS — Z1211 Encounter for screening for malignant neoplasm of colon: Secondary | ICD-10-CM

## 2024-06-12 DIAGNOSIS — R7303 Prediabetes: Secondary | ICD-10-CM

## 2024-06-12 LAB — BASIC METABOLIC PANEL WITH GFR
BUN: 17 mg/dL (ref 6–23)
CO2: 27 meq/L (ref 19–32)
Calcium: 8.9 mg/dL (ref 8.4–10.5)
Chloride: 101 meq/L (ref 96–112)
Creatinine, Ser: 0.79 mg/dL (ref 0.40–1.50)
GFR: 98.15 mL/min
Glucose, Bld: 100 mg/dL — ABNORMAL HIGH (ref 70–99)
Potassium: 3.8 meq/L (ref 3.5–5.1)
Sodium: 137 meq/L (ref 135–145)

## 2024-06-12 LAB — CBC WITH DIFFERENTIAL/PLATELET
Basophils Absolute: 0 K/uL (ref 0.0–0.1)
Basophils Relative: 0.2 % (ref 0.0–3.0)
Eosinophils Absolute: 0 K/uL (ref 0.0–0.7)
Eosinophils Relative: 0 % (ref 0.0–5.0)
HCT: 46.5 % (ref 39.0–52.0)
Hemoglobin: 15.8 g/dL (ref 13.0–17.0)
Lymphocytes Relative: 11.9 % — ABNORMAL LOW (ref 12.0–46.0)
Lymphs Abs: 1.4 K/uL (ref 0.7–4.0)
MCHC: 33.9 g/dL (ref 30.0–36.0)
MCV: 88.2 fl (ref 78.0–100.0)
Monocytes Absolute: 0.9 K/uL (ref 0.1–1.0)
Monocytes Relative: 7.4 % (ref 3.0–12.0)
Neutro Abs: 9.6 K/uL — ABNORMAL HIGH (ref 1.4–7.7)
Neutrophils Relative %: 80.5 % — ABNORMAL HIGH (ref 43.0–77.0)
Platelets: 193 K/uL (ref 150.0–400.0)
RBC: 5.27 Mil/uL (ref 4.22–5.81)
RDW: 12.7 % (ref 11.5–15.5)
WBC: 11.9 K/uL — ABNORMAL HIGH (ref 4.0–10.5)

## 2024-06-12 LAB — HEPATIC FUNCTION PANEL
ALT: 24 U/L (ref 3–53)
AST: 18 U/L (ref 5–37)
Albumin: 4.5 g/dL (ref 3.5–5.2)
Alkaline Phosphatase: 76 U/L (ref 39–117)
Bilirubin, Direct: 0.1 mg/dL (ref 0.1–0.3)
Total Bilirubin: 0.5 mg/dL (ref 0.2–1.2)
Total Protein: 7.1 g/dL (ref 6.0–8.3)

## 2024-06-12 LAB — LIPID PANEL
Cholesterol: 129 mg/dL (ref 28–200)
HDL: 45.9 mg/dL
LDL Cholesterol: 65 mg/dL (ref 10–99)
NonHDL: 82.72
Total CHOL/HDL Ratio: 3
Triglycerides: 91 mg/dL (ref 10.0–149.0)
VLDL: 18.2 mg/dL (ref 0.0–40.0)

## 2024-06-12 LAB — HEMOGLOBIN A1C: Hgb A1c MFr Bld: 6.4 % (ref 4.6–6.5)

## 2024-06-12 MED ORDER — SILDENAFIL CITRATE 100 MG PO TABS: 50.0000 mg | ORAL_TABLET | Freq: Every day | ORAL | 11 refills | Status: AC | PRN

## 2024-06-12 MED ORDER — ALPRAZOLAM 0.5 MG PO TABS: 0.5000 mg | ORAL_TABLET | Freq: Two times a day (BID) | ORAL | 0 refills | Status: AC | PRN

## 2024-06-13 ENCOUNTER — Encounter: Payer: Self-pay | Admitting: Family Medicine

## 2024-06-13 ENCOUNTER — Ambulatory Visit: Payer: Self-pay | Admitting: Family Medicine

## 2024-06-14 LAB — PSA, TOTAL WITH REFLEX TO PSA, FREE: PSA, Total: 1.5 ng/mL

## 2024-06-20 ENCOUNTER — Telehealth: Payer: Self-pay

## 2024-06-20 NOTE — Telephone Encounter (Signed)
 Attempted to reach patient concerning colonoscopy recall; unable to speak with patient;  left message and number to the office for patient to call back and schedule appts;    Patient's medication profile states pt is taking Brilinta -blood thinner- If so, patient will need an OV instead of a PV prior to his recall colonoscopy; If not on medication, can proceed with scheduling PV and colonoscopy;

## 2024-06-25 ENCOUNTER — Ambulatory Visit
Admission: EM | Admit: 2024-06-25 | Discharge: 2024-06-25 | Disposition: A | Discharge status: Home / Self Care | Attending: Emergency Medicine | Admitting: Emergency Medicine

## 2024-06-25 ENCOUNTER — Encounter: Payer: Self-pay | Admitting: Emergency Medicine

## 2024-06-25 DIAGNOSIS — U071 COVID-19: Secondary | ICD-10-CM

## 2024-06-25 DIAGNOSIS — S61431A Puncture wound without foreign body of right hand, initial encounter: Secondary | ICD-10-CM | POA: Diagnosis not present

## 2024-06-25 DIAGNOSIS — B349 Viral infection, unspecified: Secondary | ICD-10-CM

## 2024-06-25 LAB — POCT INFLUENZA A/B
Influenza A, POC: NEGATIVE
Influenza B, POC: NEGATIVE

## 2024-06-25 LAB — POC SOFIA SARS ANTIGEN FIA: SARS Coronavirus 2 Ag: POSITIVE — AB

## 2024-06-25 MED ORDER — BENZONATATE 100 MG PO CAPS: 100.0000 mg | ORAL_CAPSULE | Freq: Three times a day (TID) | ORAL | 0 refills | Status: AC

## 2024-06-25 MED ORDER — PAXLOVID (300/100) 20 X 150 MG & 10 X 100MG PO TBPK: 3.0000 | ORAL_TABLET | Freq: Two times a day (BID) | ORAL | 0 refills | Status: AC

## 2024-06-25 NOTE — ED Provider Notes (Signed)
 " [name]    CSN: [id number] Arrival date & time: 06/25/24  0909      History   Chief Complaint Chief Complaint  Patient presents with   Cough   Sore Throat   Generalized Body Aches   Insect Bite    HPI Steven Kemp is a 59 y.o. male.   Patient presents for evaluation of congestion, nonproductive cough and generalized bodyaches beginning 2 days ago.  Cough has been productive with brown to red sputum with yellow mucus expelling from the nose.  Has had a soreness to the roof of the mouth contributed to dryness of the flame during travel from the [place].  Denies fever, shortness of breath or wheezing.  Has not attempted treatment.  Denies respiratory history, non-smoker.  Patient concerned with a insect bite present to the right hand that occurred 3 to 4 days ago.  Has turned to red and become swollen but denies pain or drainage.  Has not attempted treatment to the area.    Past Medical History:  Diagnosis Date   CAD (coronary artery disease), native coronary artery    a. STEMI 10/22/2016 s/p DES to the first diagonal with residual moderate disease in the LAD, right PDA and distal LAD.   History of acute lateral wall MI    Hyperlipidemia    Hypertension    Prediabetes 09/08/2013    Patient Active Problem List   Diagnosis Date Noted   Nasal polyposis 06/08/2024   Coronary artery disease involving native coronary artery of native heart without angina pectoris 12/28/2016   History of acute lateral wall MI    Prediabetes 09/08/2013   Hyperlipidemia LDL goal <70 10/09/2009   Essential hypertension 10/09/2009   External hemorrhoids 07/24/2009   Allergic rhinitis 02/03/2007    Past Surgical History:  Procedure Laterality Date   CORONARY STENT INTERVENTION N/A 10/22/2016   Procedure: Coronary Stent Intervention;  Surgeon: Candyce GORMAN Reek, MD;  Location: [facility] INVASIVE CV LAB;  Service: Cardiovascular;  Laterality: N/A;   LEFT HEART CATH AND CORONARY  ANGIOGRAPHY N/A 10/22/2016   Procedure: Left Heart Cath and Coronary Angiography;  Surgeon: Candyce GORMAN Reek, MD;  Location: [facility] INVASIVE CV LAB;  Service: Cardiovascular;  Laterality: N/A;       Home Medications    Prior to Admission medications  Medication Sig Start Date End Date Taking? Authorizing Provider  ALPRAZolam  (XANAX ) 0.5 MG tablet Take 1 tablet (0.5 mg total) by mouth 2 (two) times daily as needed for anxiety. 06/12/24   Copland, Jacques, MD  atorvastatin  (LIPITOR ) 80 MG tablet Take 1 tablet (80 mg total) by mouth daily. 11/04/23   Lucien Orren SAILOR, PA-C  fluticasone (FLONASE) 50 MCG/ACT nasal spray Place 2 sprays into both nostrils daily.    [provider]  lisinopril  (ZESTRIL ) 20 MG tablet Take 1 tablet (20 mg total) by mouth daily. 11/04/23   Conte, Tessa N, PA-C  metoprolol  tartrate (LOPRESSOR ) 25 MG tablet TAKE 1/2 TABLET TWICE A DAY BY MOUTH 11/04/23   Conte, Tessa N, PA-C  nitroGLYCERIN  (NITROSTAT ) 0.4 MG SL tablet DISSOLVE ONE TABLET UNDER THE TONGUE EVERY 5 MINUTES AS NEEDED FOR CHEST PAIN. 03/27/24   Lucien Orren SAILOR, PA-C  sildenafil  (VIAGRA ) 100 MG tablet Take 0.5-1 tablets (50-100 mg total) by mouth daily as needed for erectile dysfunction. 06/12/24   Copland, Jacques, MD  ticagrelor  (BRILINTA ) 90 MG TABS tablet Take 1 tablet (90 mg total) by mouth 2 (two) times daily. 06/28/23   Lucien Orren  N, PA-C    Family History Family History  Problem Relation Age of Onset   Hypertension Father    Colon cancer Neg Hx    Esophageal cancer Neg Hx    Rectal cancer Neg Hx    Stomach cancer Neg Hx     Social History Social History[1]   Allergies   Patient has no known allergies.   Review of Systems Review of Systems  Constitutional: Negative.   HENT:  Positive for congestion. Negative for dental problem, drooling, ear discharge, ear pain, facial swelling, hearing loss, mouth sores, nosebleeds, postnasal drip, rhinorrhea, sinus pressure, sinus pain, sneezing, sore  throat, tinnitus, trouble swallowing and voice change.   Respiratory:  Positive for cough. Negative for apnea, choking, chest tightness, shortness of breath, wheezing and stridor.   Gastrointestinal: Negative.   Musculoskeletal:  Positive for myalgias. Negative for arthralgias, back pain, gait problem, joint swelling, neck pain and neck stiffness.     Physical Exam Triage Vital Signs ED Triage Vitals  Encounter Vitals Group     BP 06/25/24 1015 120/87     Girls Systolic BP Percentile --      Girls Diastolic BP Percentile --      Boys Systolic BP Percentile --      Boys Diastolic BP Percentile --      Pulse Rate 06/25/24 1015 69     Resp 06/25/24 1015 18     Temp 06/25/24 1015 97.9 F (36.6 C)     Temp Source 06/25/24 1015 Oral     SpO2 06/25/24 1013 98 %     Weight --      Height --      Head Circumference --      Peak Flow --      Pain Score 06/25/24 1013 2     Pain Loc --      Pain Education --      Exclude from Growth Chart --    No data found.  Updated Vital Signs BP 120/87 (BP Location: Right Arm)   Pulse 69   Temp 97.9 F (36.6 C) (Oral)   Resp 18   SpO2 98%   Visual Acuity Right Eye Distance:   Left Eye Distance:   Bilateral Distance:    Right Eye Near:   Left Eye Near:    Bilateral Near:     Physical Exam Constitutional:      Appearance: Normal appearance.  HENT:     Head: Normocephalic.     Right Ear: Tympanic membrane, ear canal and external ear normal.     Left Ear: Tympanic membrane, ear canal and external ear normal.     Nose: Congestion present.     Mouth/Throat:     Pharynx: No oropharyngeal exudate or posterior oropharyngeal erythema.  Eyes:     Extraocular Movements: Extraocular movements intact.  Cardiovascular:     Rate and Rhythm: Normal rate and regular rhythm.     Pulses: Normal pulses.     Heart sounds: Normal heart sounds.  Pulmonary:     Effort: Pulmonary effort is normal.     Breath sounds: Normal breath sounds.   Musculoskeletal:     Cervical back: Normal range of motion and neck supple.  Skin:    Comments: Less than 0.5 cm red papule present to the at the interdigital web of the right 1st and 2nd finger, no swelling drainage or pain noted  Neurological:     Mental Status: He is alert and oriented to  person, place, and time. Mental status is at baseline.      UC Treatments / Results  Labs (all labs ordered are listed, but only abnormal results are displayed) Labs Reviewed  POCT INFLUENZA A/B  POC SOFIA SARS ANTIGEN FIA    EKG   Radiology No results found.  Procedures Procedures (including critical care time)  Medications Ordered in UC Medications - No data to display  Initial Impression / Assessment and Plan / UC Course  I have reviewed the triage vital signs and the nursing notes.  Pertinent labs & imaging results that were available during my care of the patient were reviewed by me and considered in my medical decision making (see chart for details).  COVID-19, viral illness, puncture wound to the right hand without foreign body  Patient is in no signs of distress nor toxic appearing.  Vital signs are stable.  Low suspicion for pneumonia, pneumothorax or bronchitis and therefore will defer imaging.  Flu testing negative.  Discussed quarantine per the CDC and prescribed Paxlovid  and discussed administration additionally prescribed Tessalon  for management of cough.May use additional over-the-counter medications as needed for supportive care.  May follow-up with urgent care as needed if symptoms persist or worsen.  Insect bite to the hand appears to be irritation without signs of infection, discussed this with patient recommended topical hydrocortisone  and monitor closely and returning for any persisting or worsening symptoms  Note given.   Final Clinical Impressions(s) / UC Diagnoses   Final diagnoses:  Viral illness   Discharge Instructions   None    ED Prescriptions    None    PDMP not reviewed this encounter.     [1]  Social History Tobacco Use   Smoking status: Never   Smokeless tobacco: Never  Vaping Use   Vaping status: Never Used  Substance Use Topics   Alcohol use: Yes    Comment: very rare   Drug use: No     Teresa Shelba SAUNDERS, NP 06/25/24 1045  "

## 2024-06-25 NOTE — ED Triage Notes (Signed)
 Patient complains of cough, sore throat and bodyaches x 2 days. Patient recently travel out of the country via [place]. Patient also reports insect bite to right hand. Redness noted. Rates pain 2/10.

## 2024-06-25 NOTE — Discharge Instructions (Addendum)
 Covid 19 is a virus and should steadily improve in time it can take up to 7 to 10 days before you truly start to see a turnaround however things will get better  Red bump to the hand does not appear infected at this time and is most likely more so irritation from the bite itself, may use hydrocortisone  over the area twice daily, if symptoms do not resolve on their own or worsen please follow-up for reevaluation    Per the CDC you will need to quarantine and to your 24 hours without fever, if no fever may continue activity wearing mask  Begin Paxlovid  twice daily for 5 days, this medicine helps to reduce the amount of germ in the body helping to minimize symptoms and ideally, on that you are sick, does not fully take away your illness, medicine can increase the levels of your blood thinner therefore please monitor for bleeding closely and if you start to experience bruising on the body or bleeding from the gums or nose please stop medicine  May use Tessalon  pill every 8 hours as needed for cough You can take Tylenol  and/or Ibuprofen as needed for fever reduction and pain relief.   For cough: honey 1/2 to 1 teaspoon (you can dilute the honey in water or another fluid).  You can also use guaifenesin and dextromethorphan for cough. You can use a humidifier for chest congestion and cough.  If you don't have a humidifier, you can sit in the bathroom with the hot shower running.      For sore throat: try warm salt water gargles, cepacol lozenges, throat spray, warm tea or water with lemon/honey, popsicles or ice, or OTC cold relief medicine for throat discomfort.   For congestion: take a daily anti-histamine like Zyrtec, Claritin, and a oral decongestant, such as pseudoephedrine.  You can also use Flonase 1-2 sprays in each nostril daily.   It is important to stay hydrated: drink plenty of fluids (water, gatorade/powerade/pedialyte, juices, or teas) to keep your throat moisturized and help further relieve  irritation/discomfort.

## 2024-06-28 ENCOUNTER — Ambulatory Visit
Admission: EM | Admit: 2024-06-28 | Discharge: 2024-06-28 | Disposition: A | Discharge status: Home / Self Care | Attending: Internal Medicine | Admitting: Internal Medicine

## 2024-06-28 ENCOUNTER — Encounter: Payer: Self-pay | Admitting: Emergency Medicine

## 2024-06-28 DIAGNOSIS — U071 COVID-19: Secondary | ICD-10-CM

## 2024-06-28 DIAGNOSIS — J209 Acute bronchitis, unspecified: Secondary | ICD-10-CM | POA: Diagnosis not present

## 2024-06-28 DIAGNOSIS — R0602 Shortness of breath: Secondary | ICD-10-CM

## 2024-06-28 MED ORDER — PREDNISONE 20 MG PO TABS: 40.0000 mg | ORAL_TABLET | Freq: Every day | ORAL | 0 refills | Status: AC

## 2024-06-28 MED ORDER — ALBUTEROL SULFATE HFA 108 (90 BASE) MCG/ACT IN AERS
2.0000 | INHALATION_SPRAY | Freq: Once | RESPIRATORY_TRACT | Status: AC
  Administered 2024-06-28: 2 via RESPIRATORY_TRACT

## 2024-06-28 NOTE — Discharge Instructions (Addendum)
 You have bronchitis which is inflammation of the upper airways in your lungs due to a virus.   Start taking prednisone  pills as prescribed today with food once daily as prescribed (2 pills - 40mg  - once daily preferably in the morning).   Do not take any NSAIDs with steroid pills (no ibuprofen, naproxen while taking steroid, this could cause stomach upset).   Use albuterol  inhaler 2 puffs every 4-6 hours on a schedule for the next 24 hours while the steroid kicks in, then as needed for cough, shortness of breath, and wheezing.   Use guaifenesin (plain mucinex) to break up congestion in nose/chest so that you are able to excrete easier. Drink plenty of fluids to stay well hydrated while taking mucinex so that it works well in the body.   If you develop any new or worsening symptoms or if your symptoms do not start to improve, please return here or follow-up with your primary care provider. If your symptoms are severe, please go to the emergency room.

## 2024-06-28 NOTE — ED Provider Notes (Addendum)
 " Steven Kemp    CSN: [id number] Arrival date & time: 06/28/24  1156      History   Chief Complaint Chief Complaint  Patient presents with   Wheezing    HPI Steven Kemp is a 59 y.o. male.   Steven Kemp is a 59 y.o. male presenting for chief complaint of cough and congestion that started 5 days ago on June 23, 2024.  He tested positive for COVID-19 at urgent care visit on June 25, 2024 and is now back today due to new wheezing and shortness of breath associated with coughing that started 2 days ago.  He is taking Paxlovid  as prescribed and has felt relief with this medication. He is also taking other OTC cough/cold medicines with some relief.  Never smoker, denies history of asthma.  Denies recent antibiotic or steroid use.  Further denies nausea, vomiting, diarrhea, abdominal pain, leg swelling, orthopnea, and fever/chills.  Denies chest pain.   Wheezing   Past Medical History:  Diagnosis Date   CAD (coronary artery disease), native coronary artery    a. STEMI 10/22/2016 s/p DES to the first diagonal with residual moderate disease in the LAD, right PDA and distal LAD.   History of acute lateral wall MI    Hyperlipidemia    Hypertension    Prediabetes 09/08/2013    Patient Active Problem List   Diagnosis Date Noted   Nasal polyposis 06/08/2024   Coronary artery disease involving native coronary artery of native heart without angina pectoris 12/28/2016   History of acute lateral wall MI    Prediabetes 09/08/2013   Hyperlipidemia LDL goal <70 10/09/2009   Essential hypertension 10/09/2009   External hemorrhoids 07/24/2009   Allergic rhinitis 02/03/2007    Past Surgical History:  Procedure Laterality Date   CORONARY STENT INTERVENTION N/A 10/22/2016   Procedure: Coronary Stent Intervention;  Surgeon: Candyce GORMAN Reek, MD;  Location: [facility] INVASIVE CV LAB;  Service: Cardiovascular;  Laterality: N/A;   LEFT HEART CATH AND CORONARY ANGIOGRAPHY N/A  10/22/2016   Procedure: Left Heart Cath and Coronary Angiography;  Surgeon: Candyce GORMAN Reek, MD;  Location: [facility] INVASIVE CV LAB;  Service: Cardiovascular;  Laterality: N/A;       Home Medications    Prior to Admission medications  Medication Sig Start Date End Date Taking? Authorizing Provider  predniSONE  (DELTASONE ) 20 MG tablet Take 2 tablets (40 mg total) by mouth daily with breakfast for 5 days. 06/28/24 07/03/24 Yes Enedelia Dorna HERO, FNP  ALPRAZolam  (XANAX ) 0.5 MG tablet Take 1 tablet (0.5 mg total) by mouth 2 (two) times daily as needed for anxiety. 06/12/24   Copland, Jacques, MD  atorvastatin  (LIPITOR ) 80 MG tablet Take 1 tablet (80 mg total) by mouth daily. 11/04/23   Lucien Orren SAILOR, PA-C  benzonatate  (TESSALON ) 100 MG capsule Take 1 capsule (100 mg total) by mouth every 8 (eight) hours. 06/25/24   White, Adrienne R, NP  fluticasone (FLONASE) 50 MCG/ACT nasal spray Place 2 sprays into both nostrils daily.    [provider]  lisinopril  (ZESTRIL ) 20 MG tablet Take 1 tablet (20 mg total) by mouth daily. 11/04/23   Conte, Tessa N, PA-C  metoprolol  tartrate (LOPRESSOR ) 25 MG tablet TAKE 1/2 TABLET TWICE A DAY BY MOUTH 11/04/23   Lucien Orren SAILOR, PA-C  nirmatrelvir/ritonavir (PAXLOVID , 300/100,) 20 x 150 MG & 10 x 100MG  TBPK Take 3 tablets by mouth 2 (two) times daily for 5 days. Patient GFR is 98. Take nirmatrelvir (150 mg) two  tablets twice daily for 5 days and ritonavir (100 mg) one tablet twice daily for 5 days. 06/25/24 06/30/24  Teresa Shelba SAUNDERS, NP  nitroGLYCERIN  (NITROSTAT ) 0.4 MG SL tablet DISSOLVE ONE TABLET UNDER THE TONGUE EVERY 5 MINUTES AS NEEDED FOR CHEST PAIN. 03/27/24   Lucien Orren SAILOR, PA-C  sildenafil  (VIAGRA ) 100 MG tablet Take 0.5-1 tablets (50-100 mg total) by mouth daily as needed for erectile dysfunction. 06/12/24   Copland, Jacques, MD  ticagrelor  (BRILINTA ) 90 MG TABS tablet Take 1 tablet (90 mg total) by mouth 2 (two) times daily. 06/28/23   Lucien Orren SAILOR, PA-C     Family History Family History  Problem Relation Age of Onset   Hypertension Father    Colon cancer Neg Hx    Esophageal cancer Neg Hx    Rectal cancer Neg Hx    Stomach cancer Neg Hx     Social History Social History[1]   Allergies   Patient has no known allergies.   Review of Systems Review of Systems  Respiratory:  Positive for wheezing.   Per HPI   Physical Exam Triage Vital Signs ED Triage Vitals  Encounter Vitals Group     BP 06/28/24 1250 (!) 130/90     Girls Systolic BP Percentile --      Girls Diastolic BP Percentile --      Boys Systolic BP Percentile --      Boys Diastolic BP Percentile --      Pulse Rate 06/28/24 1247 65     Resp 06/28/24 1247 20     Temp 06/28/24 1247 97.9 F (36.6 C)     Temp Source 06/28/24 1247 Oral     SpO2 06/28/24 1247 97 %     Weight --      Height --      Head Circumference --      Peak Flow --      Pain Score 06/28/24 1249 0     Pain Loc --      Pain Education --      Exclude from Growth Chart --    No data found.  Updated Vital Signs BP (!) 130/90 (BP Location: Right Arm)   Pulse 65   Temp 97.9 F (36.6 C) (Oral)   Resp 20   SpO2 97%   Visual Acuity Right Eye Distance:   Left Eye Distance:   Bilateral Distance:    Right Eye Near:   Left Eye Near:    Bilateral Near:     Physical Exam Vitals and nursing note reviewed.  Constitutional:      Appearance: He is not ill-appearing or toxic-appearing.  HENT:     Head: Normocephalic and atraumatic.     Right Ear: Hearing, tympanic membrane, ear canal and external ear normal.     Left Ear: Hearing, tympanic membrane, ear canal and external ear normal.     Nose: Congestion present.     Mouth/Throat:     Lips: Pink.     Mouth: Mucous membranes are moist. No injury or oral lesions.     Dentition: Normal dentition.     Tongue: No lesions.     Pharynx: Oropharynx is clear. Uvula midline. No pharyngeal swelling, oropharyngeal exudate, posterior oropharyngeal  erythema, uvula swelling or postnasal drip.     Tonsils: No tonsillar exudate.  Eyes:     General: Lids are normal. Vision grossly intact. Gaze aligned appropriately.     Extraocular Movements: Extraocular movements intact.  Conjunctiva/sclera: Conjunctivae normal.  Neck:     Trachea: Trachea and phonation normal.  Cardiovascular:     Rate and Rhythm: Normal rate and regular rhythm.     Heart sounds: Normal heart sounds, S1 normal and S2 normal.  Pulmonary:     Effort: Pulmonary effort is normal. No respiratory distress.     Breath sounds: Normal air entry. Wheezing present. No rhonchi or rales.     Comments: Expiratory wheezing to the upper and lower lungs bilaterally without increased respiratory effort. Chest:     Chest wall: No tenderness.  Musculoskeletal:     Cervical back: Neck supple.  Lymphadenopathy:     Cervical: No cervical adenopathy.  Skin:    General: Skin is warm and dry.     Capillary Refill: Capillary refill takes less than 2 seconds.     Findings: No rash.  Neurological:     General: No focal deficit present.     Mental Status: He is alert and oriented to person, place, and time. Mental status is at baseline.     Cranial Nerves: No dysarthria or facial asymmetry.  Psychiatric:        Mood and Affect: Mood normal.        Speech: Speech normal.        Behavior: Behavior normal.        Thought Content: Thought content normal.        Judgment: Judgment normal.      UC Treatments / Results  Labs (all labs ordered are listed, but only abnormal results are displayed) Labs Reviewed - No data to display  EKG   Radiology No results found.  Procedures Procedures (including critical care time)  Medications Ordered in UC Medications  albuterol  (VENTOLIN  HFA) 108 (90 Base) MCG/ACT inhaler 2 puff (2 puffs Inhalation Given 06/28/24 1322)    Initial Impression / Assessment and Plan / UC Course  I have reviewed the triage vital signs and the nursing  notes.  Pertinent labs & imaging results that were available during my care of the patient were reviewed by me and considered in my medical decision making (see chart for details).   1.  COVID-19, acute bronchitis, shortness of breath Presentation is consistent with acute bronchitis due to COVID-19 illness. I have advised him to continue Paxlovid  antiviral.  We will add on prednisone  burst to reduce inflammation to the lungs.  Albuterol  inhaler given in clinic, he may use 2 puffs every 4-6 hours as needed for shortness of breath and wheezing. Chest x-ray ordered, however patient has to leave the clinic quickly to go get his son from school.  Lung sounds are nonfocal and there is wheezing to all lung fields bilaterally.  He is not in any acute distress and is afebrile. I am agreeable with deferring chest x-ray today due to timing.  I have advised him to return to the clinic in 2 to 3 days should his symptoms fail to improve with use of prednisone  and albuterol  to get a chest x-ray and rule out pneumonia. Recommend supportive care for symptomatic relief as outlined in AVS.   Counseled patient on potential for adverse effects with medications prescribed/recommended today, strict ER and return-to-clinic precautions discussed, patient verbalized understanding.    Final Clinical Impressions(s) / UC Diagnoses   Final diagnoses:  Shortness of breath  COVID-19  Acute bronchitis, unspecified organism     Discharge Instructions      You have bronchitis which is inflammation of the upper airways  in your lungs due to a virus.   Start taking prednisone  pills as prescribed today with food once daily as prescribed (2 pills - 40mg  - once daily preferably in the morning).   Do not take any NSAIDs with steroid pills (no ibuprofen, naproxen while taking steroid, this could cause stomach upset).   Use albuterol  inhaler 2 puffs every 4-6 hours on a schedule for the next 24 hours while the steroid kicks  in, then as needed for cough, shortness of breath, and wheezing.   Use guaifenesin (plain mucinex) to break up congestion in nose/chest so that you are able to excrete easier. Drink plenty of fluids to stay well hydrated while taking mucinex so that it works well in the body.   If you develop any new or worsening symptoms or if your symptoms do not start to improve, please return here or follow-up with your primary care provider. If your symptoms are severe, please go to the emergency room.     ED Prescriptions     Medication Sig Dispense Auth. Provider   predniSONE  (DELTASONE ) 20 MG tablet Take 2 tablets (40 mg total) by mouth daily with breakfast for 5 days. 10 tablet Enedelia Dorna HERO, FNP      PDMP not reviewed this encounter.    Enedelia Dorna HERO, [place] 06/28/24 1328     [1]  Social History Tobacco Use   Smoking status: Never   Smokeless tobacco: Never  Vaping Use   Vaping status: Never Used  Substance Use Topics   Alcohol use: Yes    Comment: very rare   Drug use: No     Enedelia Dorna HERO, FNP 06/28/24 1328  "

## 2024-06-28 NOTE — ED Triage Notes (Signed)
 Patient was diagnosed with Covid on 06-25-24. Patient now complains of wheezing  X 2 days.

## 2024-07-03 ENCOUNTER — Other Ambulatory Visit: Payer: Self-pay

## 2024-07-04 MED ORDER — TICAGRELOR 90 MG PO TABS: 90.0000 mg | ORAL_TABLET | Freq: Two times a day (BID) | ORAL | 0 refills | Status: AC

## 2024-07-25 ENCOUNTER — Other Ambulatory Visit: Payer: Self-pay | Admitting: Physician Assistant
